# Patient Record
Sex: Female | Born: 1945 | Race: White | Hispanic: No | Marital: Married | Smoking: Never smoker
Health system: Southern US, Community
[De-identification: ages and names within clinical notes are randomized; demographics above are authoritative.]

## PROBLEM LIST (undated history)

## (undated) DIAGNOSIS — I10 Essential (primary) hypertension: Secondary | ICD-10-CM

## (undated) DIAGNOSIS — J449 Chronic obstructive pulmonary disease, unspecified: Secondary | ICD-10-CM

## (undated) DIAGNOSIS — K219 Gastro-esophageal reflux disease without esophagitis: Secondary | ICD-10-CM

## (undated) HISTORY — PX: BREAST SURGERY: SHX581

## (undated) HISTORY — PX: TONSILLECTOMY: SUR1361

## (undated) HISTORY — PX: ABDOMINAL HYSTERECTOMY: SHX81

## (undated) HISTORY — PX: CHOLECYSTECTOMY: SHX55

---

## 2010-10-01 ENCOUNTER — Other Ambulatory Visit (HOSPITAL_COMMUNITY)
Admission: RE | Admit: 2010-10-01 | Discharge: 2010-10-01 | Disposition: A | Discharge status: Home / Self Care | Payer: Medicare Other | Source: Ambulatory Visit | Attending: Obstetrics and Gynecology | Admitting: Obstetrics and Gynecology

## 2010-10-01 DIAGNOSIS — Z124 Encounter for screening for malignant neoplasm of cervix: Secondary | ICD-10-CM | POA: Insufficient documentation

## 2019-10-02 DIAGNOSIS — I4891 Unspecified atrial fibrillation: Secondary | ICD-10-CM | POA: Insufficient documentation

## 2020-04-11 ENCOUNTER — Encounter (HOSPITAL_BASED_OUTPATIENT_CLINIC_OR_DEPARTMENT_OTHER): Payer: Self-pay | Admitting: *Deleted

## 2020-04-11 ENCOUNTER — Emergency Department (HOSPITAL_BASED_OUTPATIENT_CLINIC_OR_DEPARTMENT_OTHER): Payer: Medicare Other

## 2020-04-11 ENCOUNTER — Other Ambulatory Visit: Payer: Self-pay

## 2020-04-11 ENCOUNTER — Inpatient Hospital Stay (HOSPITAL_BASED_OUTPATIENT_CLINIC_OR_DEPARTMENT_OTHER)
Admission: EM | Admit: 2020-04-11 | Discharge: 2020-04-17 | DRG: 853 | Disposition: A | Discharge status: Home Health | Payer: Medicare Other | Attending: Internal Medicine | Admitting: Internal Medicine

## 2020-04-11 DIAGNOSIS — M5481 Occipital neuralgia: Secondary | ICD-10-CM | POA: Diagnosis not present

## 2020-04-11 DIAGNOSIS — N179 Acute kidney failure, unspecified: Secondary | ICD-10-CM | POA: Diagnosis present

## 2020-04-11 DIAGNOSIS — E876 Hypokalemia: Secondary | ICD-10-CM | POA: Diagnosis present

## 2020-04-11 DIAGNOSIS — E860 Dehydration: Secondary | ICD-10-CM | POA: Diagnosis present

## 2020-04-11 DIAGNOSIS — Z20822 Contact with and (suspected) exposure to covid-19: Secondary | ICD-10-CM | POA: Diagnosis present

## 2020-04-11 DIAGNOSIS — R680 Hypothermia, not associated with low environmental temperature: Secondary | ICD-10-CM | POA: Diagnosis present

## 2020-04-11 DIAGNOSIS — I10 Essential (primary) hypertension: Secondary | ICD-10-CM | POA: Diagnosis not present

## 2020-04-11 DIAGNOSIS — R29810 Facial weakness: Secondary | ICD-10-CM | POA: Diagnosis not present

## 2020-04-11 DIAGNOSIS — R4781 Slurred speech: Secondary | ICD-10-CM | POA: Diagnosis not present

## 2020-04-11 DIAGNOSIS — J449 Chronic obstructive pulmonary disease, unspecified: Secondary | ICD-10-CM | POA: Diagnosis present

## 2020-04-11 DIAGNOSIS — N390 Urinary tract infection, site not specified: Secondary | ICD-10-CM | POA: Diagnosis present

## 2020-04-11 DIAGNOSIS — I959 Hypotension, unspecified: Secondary | ICD-10-CM | POA: Diagnosis present

## 2020-04-11 DIAGNOSIS — N183 Chronic kidney disease, stage 3 unspecified: Secondary | ICD-10-CM | POA: Diagnosis present

## 2020-04-11 DIAGNOSIS — J1081 Influenza due to other identified influenza virus with encephalopathy: Secondary | ICD-10-CM | POA: Diagnosis present

## 2020-04-11 DIAGNOSIS — G9340 Encephalopathy, unspecified: Secondary | ICD-10-CM | POA: Diagnosis present

## 2020-04-11 DIAGNOSIS — J111 Influenza due to unidentified influenza virus with other respiratory manifestations: Secondary | ICD-10-CM | POA: Diagnosis not present

## 2020-04-11 DIAGNOSIS — G459 Transient cerebral ischemic attack, unspecified: Secondary | ICD-10-CM | POA: Diagnosis not present

## 2020-04-11 DIAGNOSIS — E872 Acidosis: Secondary | ICD-10-CM | POA: Diagnosis present

## 2020-04-11 DIAGNOSIS — R32 Unspecified urinary incontinence: Secondary | ICD-10-CM | POA: Diagnosis present

## 2020-04-11 DIAGNOSIS — I4891 Unspecified atrial fibrillation: Secondary | ICD-10-CM | POA: Diagnosis present

## 2020-04-11 DIAGNOSIS — J102 Influenza due to other identified influenza virus with gastrointestinal manifestations: Secondary | ICD-10-CM | POA: Diagnosis present

## 2020-04-11 DIAGNOSIS — G319 Degenerative disease of nervous system, unspecified: Secondary | ICD-10-CM | POA: Diagnosis present

## 2020-04-11 DIAGNOSIS — R29701 NIHSS score 1: Secondary | ICD-10-CM | POA: Diagnosis not present

## 2020-04-11 DIAGNOSIS — Z5309 Procedure and treatment not carried out because of other contraindication: Secondary | ICD-10-CM

## 2020-04-11 DIAGNOSIS — I6389 Other cerebral infarction: Secondary | ICD-10-CM | POA: Diagnosis not present

## 2020-04-11 DIAGNOSIS — Z90710 Acquired absence of both cervix and uterus: Secondary | ICD-10-CM | POA: Diagnosis not present

## 2020-04-11 DIAGNOSIS — K219 Gastro-esophageal reflux disease without esophagitis: Secondary | ICD-10-CM | POA: Diagnosis present

## 2020-04-11 DIAGNOSIS — I129 Hypertensive chronic kidney disease with stage 1 through stage 4 chronic kidney disease, or unspecified chronic kidney disease: Secondary | ICD-10-CM | POA: Diagnosis present

## 2020-04-11 DIAGNOSIS — I6521 Occlusion and stenosis of right carotid artery: Secondary | ICD-10-CM

## 2020-04-11 DIAGNOSIS — I479 Paroxysmal tachycardia, unspecified: Secondary | ICD-10-CM | POA: Diagnosis not present

## 2020-04-11 DIAGNOSIS — I639 Cerebral infarction, unspecified: Secondary | ICD-10-CM | POA: Diagnosis not present

## 2020-04-11 HISTORY — DX: Essential (primary) hypertension: I10

## 2020-04-11 HISTORY — DX: Gastro-esophageal reflux disease without esophagitis: K21.9

## 2020-04-11 HISTORY — DX: Chronic obstructive pulmonary disease, unspecified: J44.9

## 2020-04-11 LAB — URINALYSIS, ROUTINE W REFLEX MICROSCOPIC
Bilirubin Urine: NEGATIVE
Glucose, UA: NEGATIVE mg/dL
Ketones, ur: NEGATIVE mg/dL
Nitrite: NEGATIVE
Protein, ur: 30 mg/dL — AB
Specific Gravity, Urine: >1.03 — ABNORMAL HIGH (ref 1.005–1.030)
pH: 5 (ref 5.0–8.0)

## 2020-04-11 LAB — COMPREHENSIVE METABOLIC PANEL
ALT: 11 U/L (ref 0–44)
AST: 11 U/L — ABNORMAL LOW (ref 15–41)
Albumin: 4.3 g/dL (ref 3.5–5.0)
Alkaline Phosphatase: 98 U/L (ref 38–126)
Anion gap: 19 — ABNORMAL HIGH (ref 5–15)
BUN: 105 mg/dL — ABNORMAL HIGH (ref 8–23)
CO2: 10 mmol/L — ABNORMAL LOW (ref 22–32)
Calcium: 8.7 mg/dL — ABNORMAL LOW (ref 8.9–10.3)
Chloride: 105 mmol/L (ref 98–111)
Creatinine, Ser: 10.72 mg/dL — ABNORMAL HIGH (ref 0.44–1.00)
GFR, Estimated: 3 mL/min — ABNORMAL LOW (ref >60)
Glucose, Bld: 124 mg/dL — ABNORMAL HIGH (ref 70–99)
Potassium: 4.4 mmol/L (ref 3.5–5.1)
Sodium: 134 mmol/L — ABNORMAL LOW (ref 135–145)
Total Bilirubin: 0.7 mg/dL (ref 0.3–1.2)
Total Protein: 7.9 g/dL (ref 6.5–8.1)

## 2020-04-11 LAB — PROTIME-INR
INR: 1.1 (ref 0.8–1.2)
Prothrombin Time: 13.8 seconds (ref 11.4–15.2)

## 2020-04-11 LAB — CBC
HCT: 45.3 % (ref 36.0–46.0)
Hemoglobin: 15.9 g/dL — ABNORMAL HIGH (ref 12.0–15.0)
MCH: 31.1 pg (ref 26.0–34.0)
MCHC: 35.1 g/dL (ref 30.0–36.0)
MCV: 88.5 fL (ref 80.0–100.0)
Platelets: 231 10*3/uL (ref 150–400)
RBC: 5.12 MIL/uL — ABNORMAL HIGH (ref 3.87–5.11)
RDW: 13.5 % (ref 11.5–15.5)
WBC: 9.4 10*3/uL (ref 4.0–10.5)
nRBC: 0 % (ref 0.0–0.2)

## 2020-04-11 LAB — TROPONIN I (HIGH SENSITIVITY)
Troponin I (High Sensitivity): 4 ng/L (ref <18)
Troponin I (High Sensitivity): 4 ng/L (ref <18)

## 2020-04-11 LAB — RESP PANEL BY RT-PCR (FLU A&B, COVID) ARPGX2
Influenza A by PCR: POSITIVE — AB
Influenza B by PCR: NEGATIVE
SARS Coronavirus 2 by RT PCR: NEGATIVE

## 2020-04-11 LAB — URINALYSIS, MICROSCOPIC (REFLEX)

## 2020-04-11 LAB — DIFFERENTIAL
Abs Immature Granulocytes: 0.03 10*3/uL (ref 0.00–0.07)
Basophils Absolute: 0 10*3/uL (ref 0.0–0.1)
Basophils Relative: 0 %
Eosinophils Absolute: 0 10*3/uL (ref 0.0–0.5)
Eosinophils Relative: 0 %
Immature Granulocytes: 0 %
Lymphocytes Relative: 34 %
Lymphs Abs: 3.2 10*3/uL (ref 0.7–4.0)
Monocytes Absolute: 0.6 10*3/uL (ref 0.1–1.0)
Monocytes Relative: 6 %
Neutro Abs: 5.5 10*3/uL (ref 1.7–7.7)
Neutrophils Relative %: 60 %

## 2020-04-11 LAB — CBG MONITORING, ED: Glucose-Capillary: 110 mg/dL — ABNORMAL HIGH (ref 70–99)

## 2020-04-11 LAB — LACTIC ACID, PLASMA: Lactic Acid, Venous: 1 mmol/L (ref 0.5–1.9)

## 2020-04-11 LAB — APTT: aPTT: 27 seconds (ref 24–36)

## 2020-04-11 IMAGING — DX DG CHEST 1V PORT
1 series · 1 of 1 positions shown · non-contrast
Comparison: None.

CLINICAL DATA: Questionable sepsis

EXAM:
PORTABLE CHEST 1 VIEW

[chest ap]
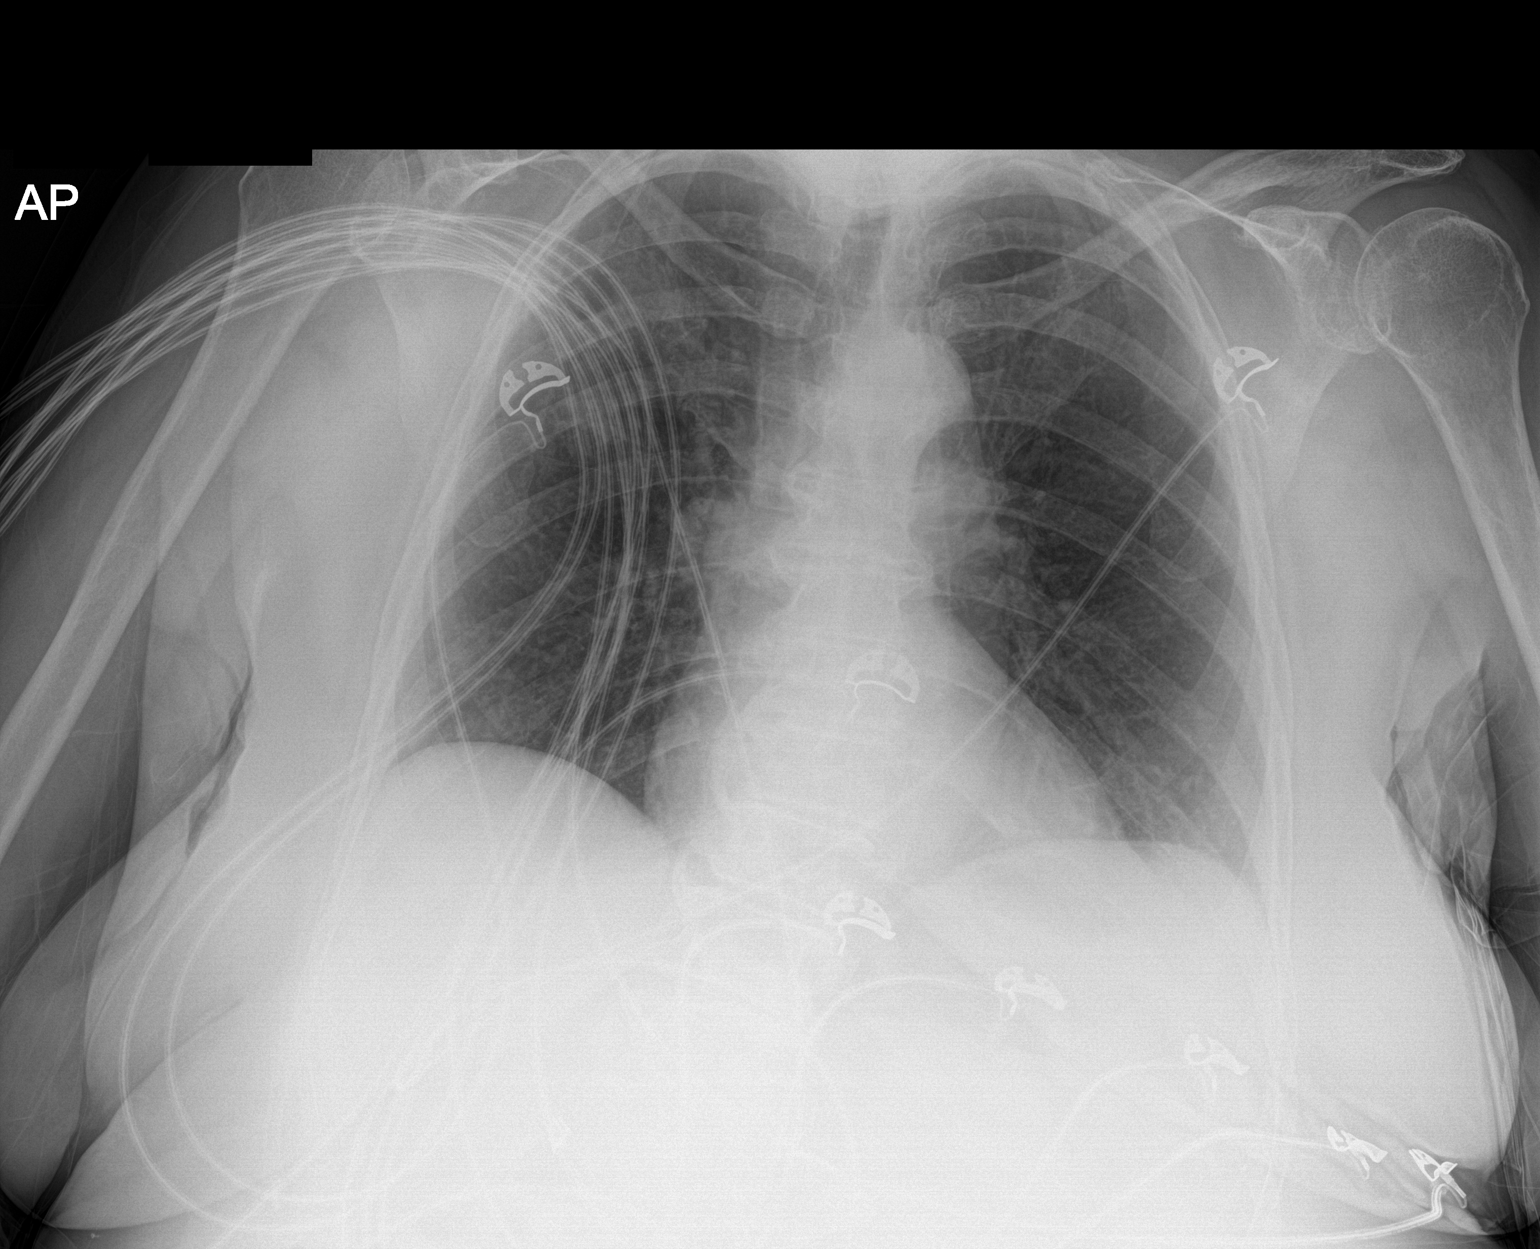

[1 of 1 positions shown; findings below may reference images not displayed]

FINDINGS: The lungs are clear and negative for focal airspace consolidation,
pulmonary edema or suspicious pulmonary nodule. No pleural effusion
or pneumothorax. Cardiac and mediastinal contours are within normal
limits. No acute fracture or lytic or blastic osseous lesions. The
visualized upper abdominal bowel gas pattern is unremarkable.
IMPRESSION: No active disease.

## 2020-04-11 IMAGING — CT CT HEAD W/O CM
3 series · 16 of 47 positions shown, 19 images · non-contrast
Comparison: Head CT, [DATE]

CLINICAL DATA: Confusion for a week. Diarrhea last weekend.
Weakness.

EXAM:
CT HEAD WITHOUT CONTRAST
TECHNIQUE: Contiguous axial images were obtained from the base of the skull
through the vertex without intravenous contrast.

[Series 2: head wo · axial · 0.41mm/px · z∈[-186,-61]mm · 10 of 31 slices shown, 13 images]
[im 3/31  brain]
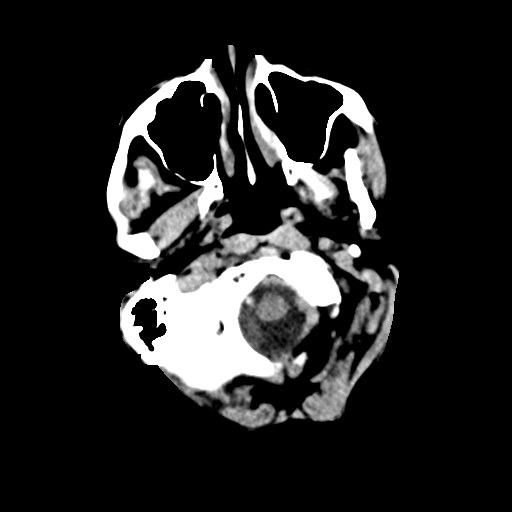
[im 3/31  bone]
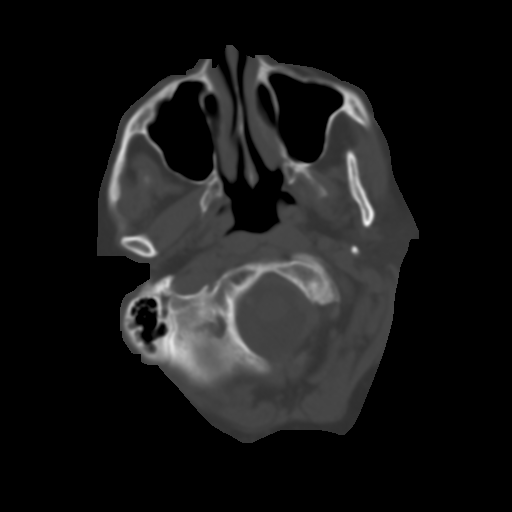
[im 6/31  brain]
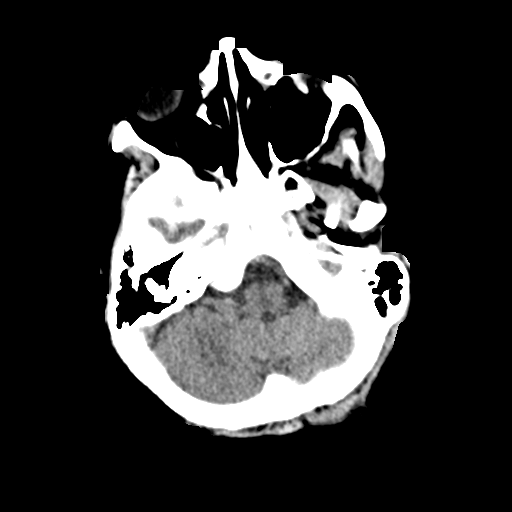
[im 9/31  brain]
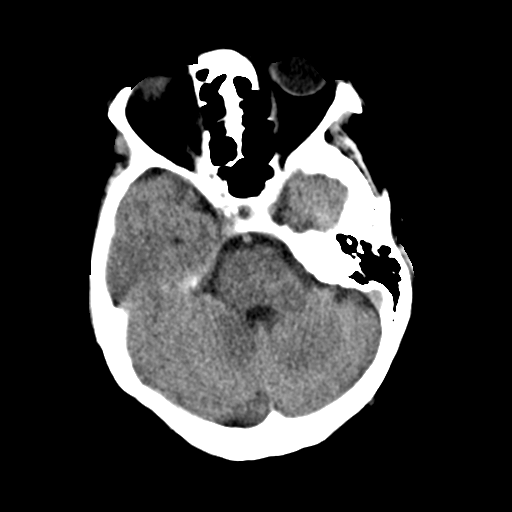
[im 11/31  brain]
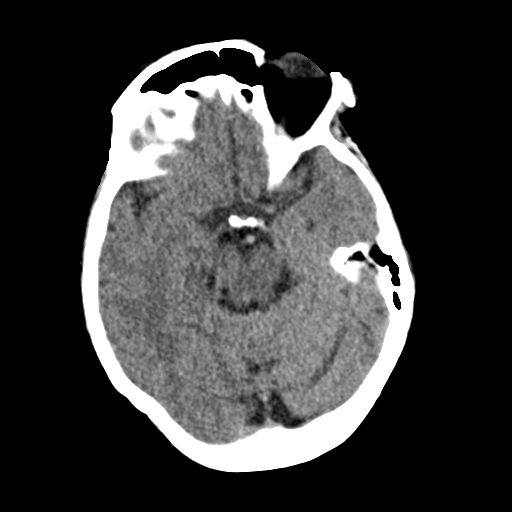
[im 14/31  brain]
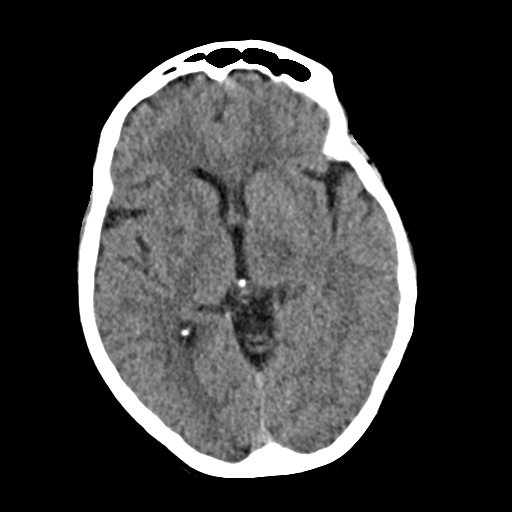
[im 14/31  bone]
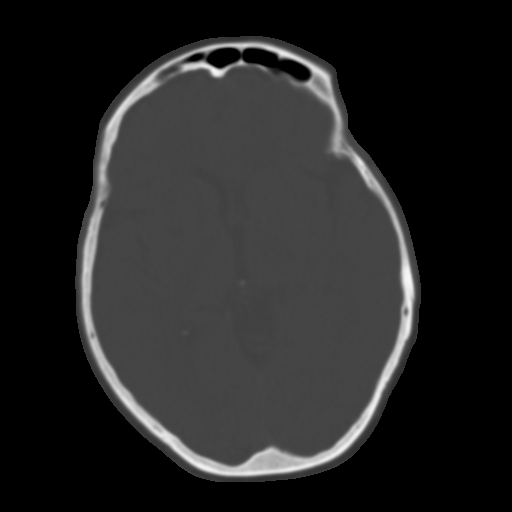
[im 17/31  brain]
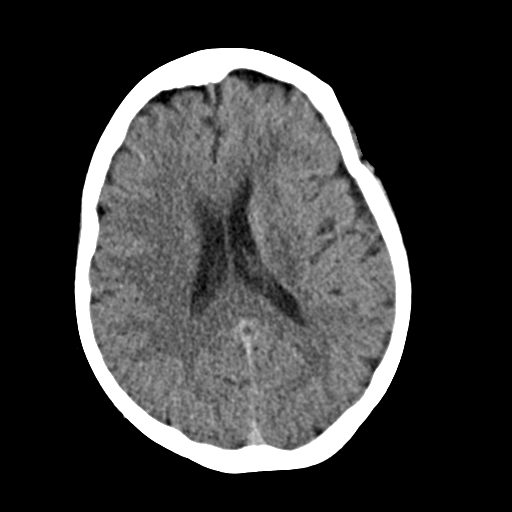
[im 20/31  brain]
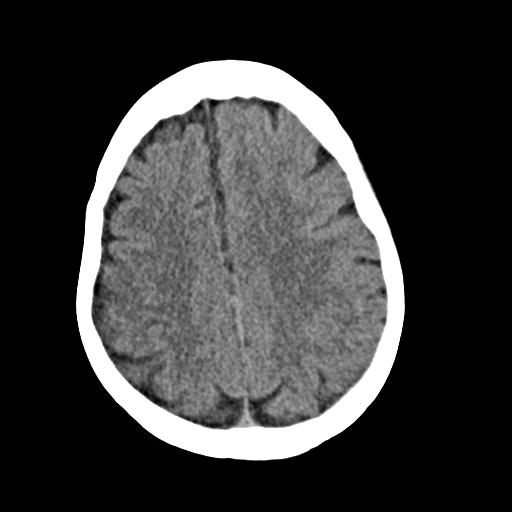
[im 23/31  brain]
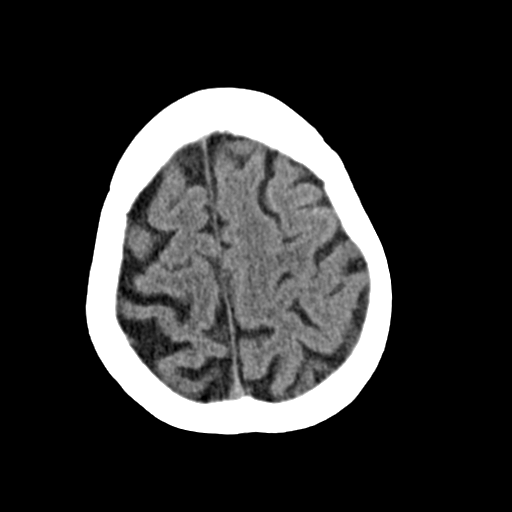
[im 25/31  brain]
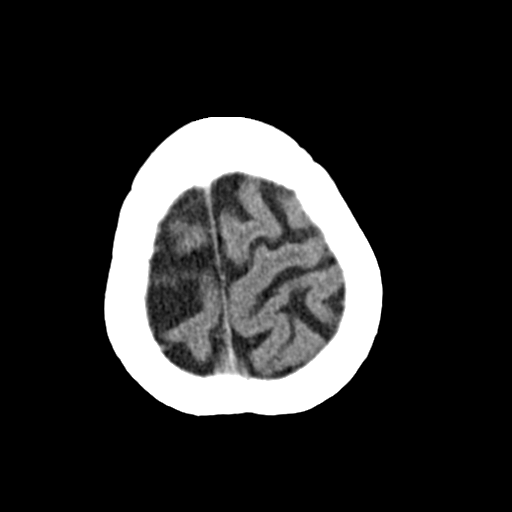
[im 25/31  bone]
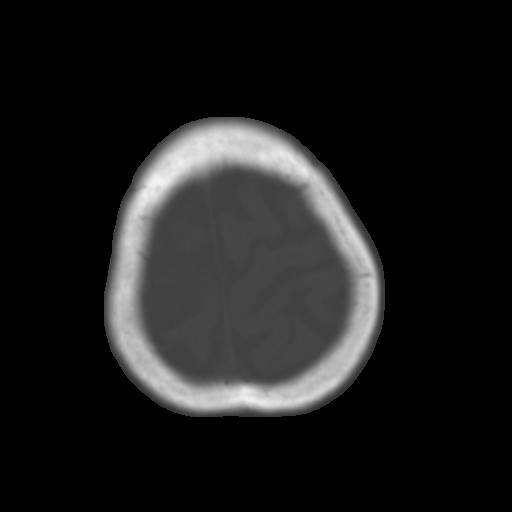
[im 28/31  brain]
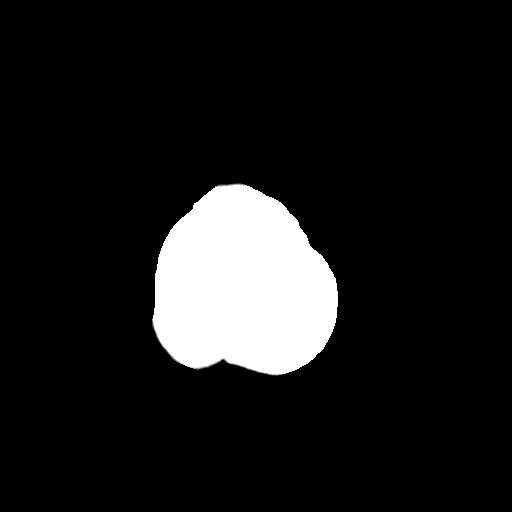

[Series 4: coronal soft · coronal · 0.33mm/px · 3 of 66 slices shown]
[im 22/66  brain]
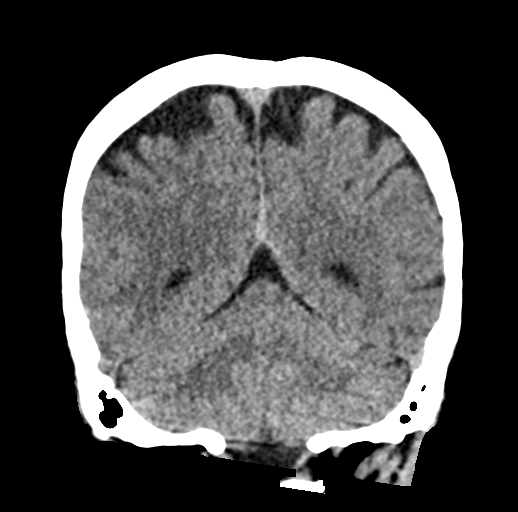
[im 29/66  brain]
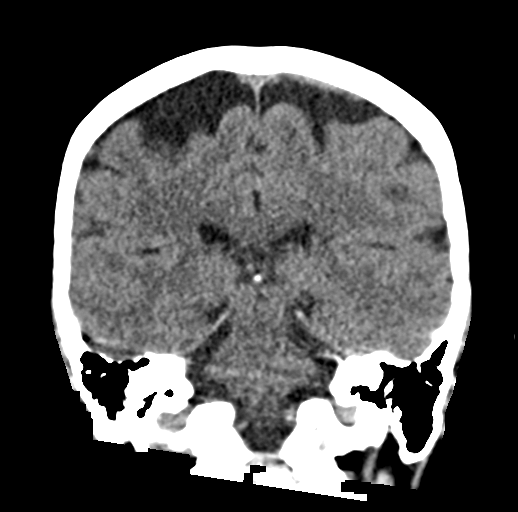
[im 37/66  brain]
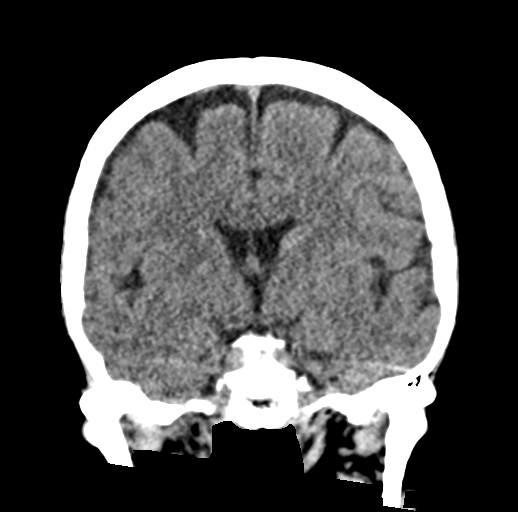

[Series 5: sag soft · sagittal · 0.33mm/px · 3 of 51 slices shown]
[im 19/51  brain]
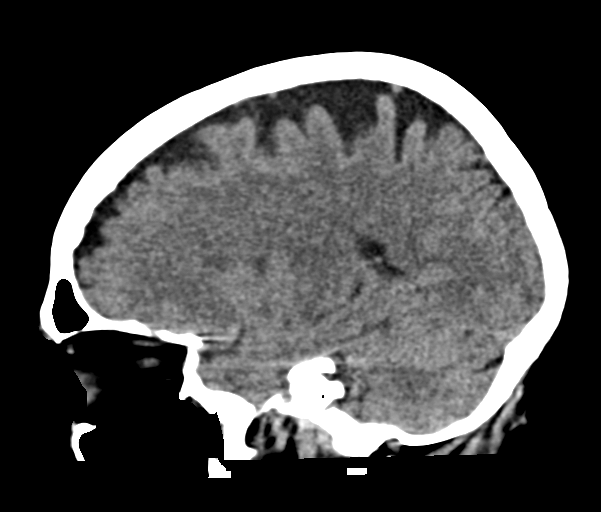
[im 26/51  brain]
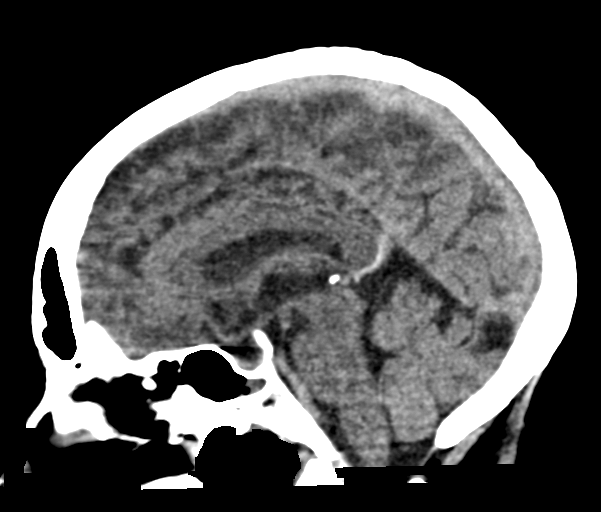
[im 33/51  brain]
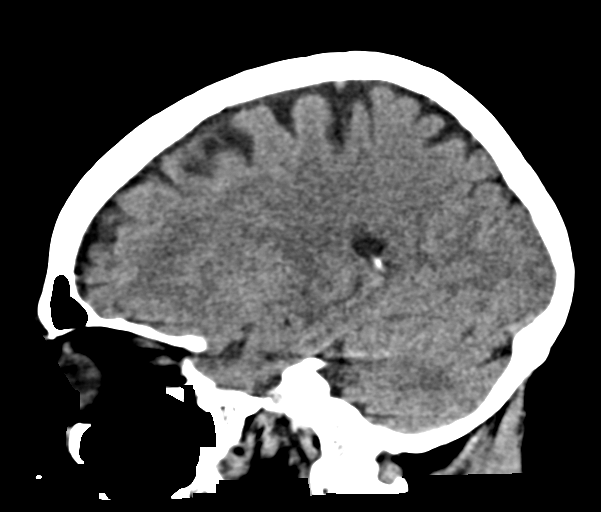

[16 of 47 positions shown; findings below may reference images not displayed]

FINDINGS: Brain: No evidence of acute infarction, hemorrhage, hydrocephalus,
extra-axial collection or mass lesion/mass effect.

Small area of lucency in the right basal ganglia consistent with a
old lacunar infarct. Minor areas of white matter hypoattenuation are
noted bilaterally consistent with chronic microvascular ischemic
change.

Vascular: No hyperdense vessel or unexpected calcification.

Skull: Normal. Negative for fracture or focal lesion.

Sinuses/Orbits: Globes and orbits are unremarkable. Visualized
sinuses are clear.

Other: None.
IMPRESSION: 1. No acute intracranial abnormalities.

## 2020-04-11 MED ORDER — SODIUM CHLORIDE 0.9 % IV SOLN: INTRAVENOUS | Status: DC | PRN

## 2020-04-11 MED ORDER — PIPERACILLIN-TAZOBACTAM 3.375 G IVPB 30 MIN
3.3750 g | Freq: Once | INTRAVENOUS | Status: AC
  Administered 2020-04-11: 3.375 g via INTRAVENOUS
  Filled 2020-04-11: qty 50

## 2020-04-11 MED ORDER — LACTATED RINGERS IV BOLUS
1000.0000 mL | Freq: Once | INTRAVENOUS | Status: AC
  Administered 2020-04-11: 1000 mL via INTRAVENOUS

## 2020-04-11 MED ORDER — SODIUM CHLORIDE 0.9% FLUSH
3.0000 mL | Freq: Once | INTRAVENOUS | Status: DC
  Filled 2020-04-11: qty 3

## 2020-04-11 MED ORDER — SODIUM CHLORIDE 0.9 % IV BOLUS
1000.0000 mL | Freq: Once | INTRAVENOUS | Status: AC
  Administered 2020-04-11: 1000 mL via INTRAVENOUS

## 2020-04-11 MED ORDER — OSELTAMIVIR PHOSPHATE 75 MG PO CAPS
75.0000 mg | ORAL_CAPSULE | Freq: Once | ORAL | Status: AC
  Administered 2020-04-11: 75 mg via ORAL
  Filled 2020-04-11: qty 1

## 2020-04-11 NOTE — ED Notes (Signed)
Warm blankets applied to Pt directly after temp foley inserted. Pt temp started at 31 and is currently 35.9

## 2020-04-11 NOTE — ED Notes (Signed)
Hong MD advises to use saline bolus only, LR not compatible with Zosyn, no need for LR if patient BP stays above 100.

## 2020-04-11 NOTE — ED Triage Notes (Signed)
Weakness. Confused for a week. Diarrhea x Friday, Saturday and Sunday.

## 2020-04-11 NOTE — ED Notes (Signed)
Pt husband at bedside, given the information of the pt room and phone number for floor

## 2020-04-11 NOTE — H&P (Signed)
History and Physical   Roslin Norwood XYB:[id number] DOB: Oct 10, 1945 DOA: 04/11/2020  Referring MD/NP/PA: Dr. Audley Hose, at [facility]  PCP: Pcp, No   Outpatient Specialists: None  Patient coming from: [facility]  Chief Complaint: Weakness and fatigue  HPI: Vicki Swanson is a 74 y.o. female with medical history significant of hypertension, COPD, GERD who presented to med [facility] with 5 days of pulse.  Diarrhea fever weakness and fatigue.  Patient lives with her husband and has been doing fine until this onset of symptoms.  She has not been vaccinated against COVID-19.  She came to the ER very symptomatic and weak.  She was also altered in her mental status.  Husband has tried to let patient eat and drink at home on that has become difficult.  In the last 3 days she has slowly deteriorated so he brought her to the ER for further evaluation and treatment.  Patient is seen and evaluated.  She is appears to be in acute kidney injury.  Has not been vaccinated against COVID-19 but viral screen shows patient is positive for influenza.  She is being admitted to the hospital with acute influenza infection as well as acute kidney injury.  No previous records in the hospital or in our system but creatinine is above 10.  With spew is diarrhea over the last 5 days patient presumed to have prerenal azotemia.  No obvious acute need for hemodialysis..  ED Course: Temperature 95 with blood pressure 86/54 initially pulse 54 respiratory rate 21 oxygen sat 95% room air.  Current temperature up to 98.3 and blood pressure 118/53.  Acute viral screen is positive for influenza A.  Urinalysis showed cloudy urine with moderate leukocytes nitrite negative.  WBC 21-50 and many bacteria.  Sodium 134 potassium 4.4 chloride 105 CO2 20 glucose 124 BUN 105 creatinine 10.72 calcium 8.7.  Anion gap of 19.  Troponin of 4 lactic acid 1.0.  CBC entirely within normal.  Head CT without contrast is negative.  Chest  x-ray shows no active finding.  Patient being admitted with acute influenza A infection.  Review of Systems: As per HPI otherwise 10 point review of systems negative.    Past Medical History:  Diagnosis Date  . COPD (chronic obstructive pulmonary disease) (HCC)   . GERD (gastroesophageal reflux disease)   . Hypertension     Past Surgical History:  Procedure Laterality Date  . ABDOMINAL HYSTERECTOMY    . BREAST SURGERY    . CHOLECYSTECTOMY    . TONSILLECTOMY       reports that she has never smoked. She has never used smokeless tobacco. She reports that she does not drink alcohol and does not use drugs.  No Known Allergies  No family history on file.   Prior to Admission medications   Not on File    Physical Exam: Vitals:   04/11/20 2000 04/11/20 2030 04/11/20 2100 04/11/20 2224  BP: (!) 109/52 (!) 115/51 (!) 121/54 (!) 118/53  Pulse: 64 67 66 64  Resp: 15 20 17 16   Temp: 98.4 F (36.9 C) 98.6 F (37 C) 98.6 F (37 C) 98.3 F (36.8 C)  TempSrc:    Oral  SpO2: 100% 98% 100% 98%  Weight:      Height:          Constitutional: Acutely ill looking, confused, Vitals:   04/11/20 2000 04/11/20 2030 04/11/20 2100 04/11/20 2224  BP: (!) 109/52 (!) 115/51 (!) 121/54 (!) 118/53  Pulse: 64 67 66 64  Resp: 15 20 17 16   Temp: 98.4 F (36.9 C) 98.6 F (37 C) 98.6 F (37 C) 98.3 F (36.8 C)  TempSrc:    Oral  SpO2: 100% 98% 100% 98%  Weight:      Height:       Eyes: PERRL, lids and conjunctivae normal ENMT: Mucous membranes are dry. Posterior pharynx clear of any exudate or lesions.Normal dentition.  Neck: normal, supple, no masses, no thyromegaly Respiratory: Coarse breath sounds with mild rhonchi bilaterally, no wheezing, no crackles. Normal respiratory effort. No accessory muscle use.  Cardiovascular: Regular rate and rhythm, no murmurs / rubs / gallops. No extremity edema. 2+ pedal pulses. No carotid bruits.  Abdomen: no tenderness, no masses palpated. No  hepatosplenomegaly. Bowel sounds positive.  Musculoskeletal: no clubbing / cyanosis. No joint deformity upper and lower extremities. Good ROM, no contractures. Normal muscle tone.  Skin: no rashes, lesions, ulcers. No induration Neurologic: CN 2-12 grossly intact. Sensation intact, DTR normal. Strength 5/5 in all 4.  Psychiatric: Confused, not agitated.     Labs on Admission: I have personally reviewed following labs and imaging studies  CBC: Recent Labs  Lab 04/11/20 1250  WBC 9.4  NEUTROABS 5.5  HGB 15.9*  HCT 45.3  MCV 88.5  PLT 231   Basic Metabolic Panel: Recent Labs  Lab 04/11/20 1250  NA 134*  K 4.4  CL 105  CO2 10*  GLUCOSE 124*  BUN 105*  CREATININE 10.72*  CALCIUM 8.7*   GFR: Estimated Creatinine Clearance: 3.8 mL/min (A) (by C-G formula based on SCr of 10.72 mg/dL (H)). Liver Function Tests: Recent Labs  Lab 04/11/20 1250  AST 11*  ALT 11  ALKPHOS 98  BILITOT 0.7  PROT 7.9  ALBUMIN 4.3   No results for input(s): LIPASE, AMYLASE in the last 168 hours. No results for input(s): AMMONIA in the last 168 hours. Coagulation Profile: Recent Labs  Lab 04/11/20 1250  INR 1.1   Cardiac Enzymes: No results for input(s): CKTOTAL, CKMB, CKMBINDEX, TROPONINI in the last 168 hours. BNP (last 3 results) No results for input(s): PROBNP in the last 8760 hours. HbA1C: No results for input(s): HGBA1C in the last 72 hours. CBG: Recent Labs  Lab 04/11/20 1249  GLUCAP 110*   Lipid Profile: No results for input(s): CHOL, HDL, LDLCALC, TRIG, CHOLHDL, LDLDIRECT in the last 72 hours. Thyroid Function Tests: No results for input(s): TSH, T4TOTAL, FREET4, T3FREE, THYROIDAB in the last 72 hours. Anemia Panel: No results for input(s): VITAMINB12, FOLATE, FERRITIN, TIBC, IRON, RETICCTPCT in the last 72 hours. Urine analysis:    Component Value Date/Time   COLORURINE YELLOW 04/11/2020 1700   APPEARANCEUR CLOUDY (A) 04/11/2020 1700   LABSPEC >1.030 (H) 04/11/2020  1700   PHURINE 5.0 04/11/2020 1700   GLUCOSEU NEGATIVE 04/11/2020 1700   HGBUR SMALL (A) 04/11/2020 1700   BILIRUBINUR NEGATIVE 04/11/2020 1700   KETONESUR NEGATIVE 04/11/2020 1700   PROTEINUR 30 (A) 04/11/2020 1700   NITRITE NEGATIVE 04/11/2020 1700   LEUKOCYTESUR MODERATE (A) 04/11/2020 1700   Sepsis Labs: @LABRCNTIP (procalcitonin:4,lacticidven:4) ) Recent Results (from the past 240 hour(s))  Resp Panel by RT-PCR (Flu A&B, Covid) Nasopharyngeal Swab     Status: Abnormal   Collection Time: 04/11/20  1:12 PM   Specimen: Nasopharyngeal Swab; Nasopharyngeal(NP) swabs in vial transport medium  Result Value Ref Range Status   SARS Coronavirus 2 by RT PCR NEGATIVE NEGATIVE Final    Comment: (NOTE) SARS-CoV-2 target nucleic acids are  NOT DETECTED.  The SARS-CoV-2 RNA is generally detectable in upper respiratory specimens during the acute phase of infection. The lowest concentration of SARS-CoV-2 viral copies this assay can detect is 138 copies/mL. A negative result does not preclude SARS-Cov-2 infection and should not be used as the sole basis for treatment or other patient management decisions. A negative result may occur with  improper specimen collection/handling, submission of specimen other than nasopharyngeal swab, presence of viral mutation(s) within the areas targeted by this assay, and inadequate number of viral copies(<138 copies/mL). A negative result must be combined with clinical observations, patient history, and epidemiological information. The expected result is Negative.  Fact Sheet for Patients:  BloggerCourse.comhttps://www.fda.gov/media/152166/download  Fact Sheet for Healthcare Providers:  SeriousBroker.ithttps://www.fda.gov/media/152162/download  This test is no t yet approved or cleared by the [place] FDA and  has been authorized for detection and/or diagnosis of SARS-CoV-2 by FDA under an Emergency Use Authorization (EUA). This EUA will remain  in effect (meaning this test can be  used) for the duration of the COVID-19 declaration under Section 564(b)(1) of the Act, 21 U.S.C.section 360bbb-3(b)(1), unless the authorization is terminated  or revoked sooner.       Influenza A by PCR POSITIVE (A) NEGATIVE Final   Influenza B by PCR NEGATIVE NEGATIVE Final    Comment: (NOTE) The Xpert Xpress SARS-CoV-2/FLU/RSV plus assay is intended as an aid in the diagnosis of influenza from Nasopharyngeal swab specimens and should not be used as a sole basis for treatment. Nasal washings and aspirates are unacceptable for Xpert Xpress SARS-CoV-2/FLU/RSV testing.  Fact Sheet for Patients: BloggerCourse.comhttps://www.fda.gov/media/152166/download  Fact Sheet for Healthcare Providers: SeriousBroker.ithttps://www.fda.gov/media/152162/download  This test is not yet approved or cleared by the [place] FDA and has been authorized for detection and/or diagnosis of SARS-CoV-2 by FDA under an Emergency Use Authorization (EUA). This EUA will remain in effect (meaning this test can be used) for the duration of the COVID-19 declaration under Section 564(b)(1) of the Act, 21 U.S.C. section 360bbb-3(b)(1), unless the authorization is terminated or revoked.  Performed at [facility], [address] [place], [place] [id number]      Radiological Exams on Admission: CT HEAD WO CONTRAST  Result Date: 04/11/2020 CLINICAL DATA:  Confusion for a week. Diarrhea last weekend. Weakness. EXAM: CT HEAD WITHOUT CONTRAST TECHNIQUE: Contiguous axial images were obtained from the base of the skull through the vertex without intravenous contrast. COMPARISON:  Head CT, 06/07/2016 FINDINGS: Brain: No evidence of acute infarction, hemorrhage, hydrocephalus, extra-axial collection or mass lesion/mass effect. Small area of lucency in the right basal ganglia consistent with a old lacunar infarct. Minor areas of white matter hypoattenuation are noted bilaterally consistent with chronic microvascular ischemic change. Vascular:  No hyperdense vessel or unexpected calcification. Skull: Normal. Negative for fracture or focal lesion. Sinuses/Orbits: Globes and orbits are unremarkable. Visualized sinuses are clear. Other: None. IMPRESSION: 1. No acute intracranial abnormalities. Electronically Signed   By: Amie Portlandavid  Ormond M.D.   On: 04/11/2020 13:49   DG Chest Port 1 View  Result Date: 04/11/2020 CLINICAL DATA:  Questionable sepsis EXAM: PORTABLE CHEST 1 VIEW COMPARISON:  None. FINDINGS: The lungs are clear and negative for focal airspace consolidation, pulmonary edema or suspicious pulmonary nodule. No pleural effusion or pneumothorax. Cardiac and mediastinal contours are within normal limits. No acute fracture or lytic or blastic osseous lesions. The visualized upper abdominal bowel gas pattern is unremarkable. IMPRESSION: No active disease. Electronically Signed   By: Isac CaddyHeath  McCullough M.D.  On: 04/11/2020 13:48     Assessment/Plan Principal Problem:   Influenza Active Problems:   Acute lower UTI   COPD (chronic obstructive pulmonary disease) (HCC)   GERD (gastroesophageal reflux disease)   Essential hypertension   AKI (acute kidney injury) (HCC)     #1 acute influenza A infection: Patient will be admitted for symptomatic management.  Hydrate patient.  Initiate Tamiflu for 5 days.  Mainly hydration and pain management.  #2 acute kidney injury: No documented history of chronic kidney disease.  No prior records in our system.  At this point patient is presumed to have AKI because of 5 days experience diarrhea and possible dehydration.  I will still check a renal ultrasound to rule out obstructive uropathy.  If none exist we will hydrate and consider nephrology consult if no immediate improvement  #3 COPD: No exacerbation  #4 GERD: Continue his PPIs  #5 essential hypertension: Patient was profoundly hypotensive in the ER.  Blood pressure now normal.  Hold antihypertensives for now  #6 possible UTI: Based on  patient's urinalysis she may have had a UTI.  Continue empiric Rocephin.  Await urine culture and sensitivity.  #7 confusion and altered mental status: Secondary to acute illness including influenza as well as the AKI.  Continue monitoring  #8 history of atrial fibrillation: No obvious A. fib now.  Patient does not appear to be on chronic medications.  Will obtain records in the morning   DVT prophylaxis: Heparin Code Status: Full code Family Communication: Husband Disposition Plan: Home Consults called: None Admission status: Inpatient  Severity of Illness: The appropriate patient status for this patient is INPATIENT. Inpatient status is judged to be reasonable and necessary in order to provide the required intensity of service to ensure the patient's safety. The patient's presenting symptoms, physical exam findings, and initial radiographic and laboratory data in the context of their chronic comorbidities is felt to place them at high risk for further clinical deterioration. Furthermore, it is not anticipated that the patient will be medically stable for discharge from the hospital within 2 midnights of admission. The following factors support the patient status of inpatient.   " The patient's presenting symptoms include confusion and generalized weakness. " The worrisome physical exam findings include fatigue and dry mucous membranes. " The initial radiographic and laboratory data are worrisome because of creatinine of more than 10 and positive influenza A. " The chronic co-morbidities include atrial fibrillation.   * I certify that at the point of admission it is my clinical judgment that the patient will require inpatient hospital care spanning beyond 2 midnights from the point of admission due to high intensity of service, high risk for further deterioration and high frequency of surveillance required.Lonia Blood MD [facility] Pager [phone]  If 7PM-7AM, please  contact night-coverage [email or web address] Password [facility]  04/11/2020, 11:54 PM

## 2020-04-11 NOTE — H&P (Incomplete)
History and Physical   Vicki Swanson ZOX:[id number] DOB: [birth year] DOA: 04/11/2020  Referring MD/NP/PA: Dr. Audley HoseHong, at [facility]  PCP: Pcp, No   Outpatient Specialists: None  [facility] from[facility]  Chief Complaint: Weakness and fatigue  HPI: Vicki Swanson is a 74 y.o. female with medical history significant of hypertension, COPD, GERD who [facility] 5 days of pulse.  Diarrhea fever weakness and fatigue.  Patient lives with her husband and has been doing fine until this onset of symptoms.  She has not been vaccinated against COVID-19.  She came to the ER very symptomatic and weak.  She was also altered in her mental status.  Husband has tried to let patient eat and drink at home on that has become difficult.  In the last 3 days she has slowly deteriorated so he brought her to the ER for further evaluation and treatment.  Patient is seen and evaluated.  She is appears to be in acute kidney injury.  Has not been vaccinated against COVID-19 but viral screen shows patient is positive for influenza.  She is being admitted to the hospital with acute influenza infection as well as acute kidney injury.  No previous records in the hospital or in our system but creatinine is above 10.  With spew is diarrhea over the last 5 days patient presumed to have prerenal azotemia.  No obvious acute need for hemodialysis..  ED Course: Temperature 95 with blood pressure 86/54 initially pulse 54 respiratory rate 21 oxygen sat 95% room air.  Current temperature up to 98.3 and blood pressure 118/53.  Acute viral screen is positive for influenza A.  Urinalysis showed cloudy urine with moderate leukocytes nitrite negative.  WBC 21-50 and many bacteria.  Sodium 134 potassium 4.4 chloride 105 CO2 20 glucose 124 BUN 105 creatinine 10.72 calcium 8.7.  Anion gap of 19.  Troponin of 4 lactic acid 1.0.  CBC entirely within normal.  Head CT without contrast is negative.  Chest  x-ray shows no active finding.  Patient being admitted with acute influenza A infection.  Review of Systems: As per HPI otherwise 10 point review of systems negative.    Past Medical History:  Diagnosis Date  . COPD (chronic obstructive pulmonary disease) (HCC)   . GERD (gastroesophageal reflux disease)   . Hypertension     Past Surgical History:  Procedure Laterality Date  . ABDOMINAL HYSTERECTOMY    . BREAST SURGERY    . CHOLECYSTECTOMY    . TONSILLECTOMY       reports that she has never smoked. She has never used smokeless tobacco. She reports that she does not drink alcohol and does not use drugs.  No Known Allergies  No family history on file.   Prior to Admission medications   Not on File    Physical Exam: Vitals:   04/11/20 2000 04/11/20 2030 04/11/20 2100 04/11/20 2224  BP: (!) 109/52 (!) 115/51 (!) 121/54 (!) 118/53  Pulse: 64 67 66 64  Resp: 15 20 17 16   Temp: 98.4 F (36.9 C) 98.6 F (37 C) 98.6 F (37 C) 98.3 F (36.8 C)  TempSrc:    Oral  SpO2: 100% 98% 100% 98%  Weight:      Height:          Constitutional: NAD, calm, comfortable Vitals:   04/11/20 2000 04/11/20 2030 04/11/20 2100 04/11/20 2224  BP: (!) 109/52 (!) 115/51 (!) 121/54 (!) 118/53  Pulse: 64 67 66 64  Resp: 15 20 17 16   Temp: 98.4 F (36.9 C) 98.6 F (37 C) 98.6 F (37 C) 98.3 F (36.8 C)  TempSrc:    Oral  SpO2: 100% 98% 100% 98%  Weight:      Height:       Eyes: PERRL, lids and conjunctivae normal ENMT: Mucous membranes are moist. Posterior pharynx clear of any exudate or lesions.Normal dentition.  Neck: normal, supple, no masses, no thyromegaly Respiratory: clear to auscultation bilaterally, no wheezing, no crackles. Normal respiratory effort. No accessory muscle use.  Cardiovascular: Regular rate and rhythm, no murmurs / rubs / gallops. No extremity edema. 2+ pedal pulses. No carotid bruits.  Abdomen: no tenderness, no masses palpated. No hepatosplenomegaly. Bowel  sounds positive.  Musculoskeletal: no clubbing / cyanosis. No joint deformity upper and lower extremities. Good ROM, no contractures. Normal muscle tone.  Skin: no rashes, lesions, ulcers. No induration Neurologic: CN 2-12 grossly intact. Sensation intact, DTR normal. Strength 5/5 in all 4.  Psychiatric: Normal judgment and insight. Alert and oriented x 3. Normal mood.     Labs on Admission: I have personally reviewed following labs and imaging studies  CBC: Recent Labs  Lab 04/11/20 1250  WBC 9.4  NEUTROABS 5.5  HGB 15.9*  HCT 45.3  MCV 88.5  PLT 231   Basic Metabolic Panel: Recent Labs  Lab 04/11/20 1250  NA 134*  K 4.4  CL 105  CO2 10*  GLUCOSE 124*  BUN 105*  CREATININE 10.72*  CALCIUM 8.7*   GFR: Estimated Creatinine Clearance: 3.8 mL/min (A) (by C-G formula based on SCr of 10.72 mg/dL (H)). Liver Function Tests: Recent Labs  Lab 04/11/20 1250  AST 11*  ALT 11  ALKPHOS 98  BILITOT 0.7  PROT 7.9  ALBUMIN 4.3   No results for input(s): LIPASE, AMYLASE in the last 168 hours. No results for input(s): AMMONIA in the last 168 hours. Coagulation Profile: Recent Labs  Lab 04/11/20 1250  INR 1.1   Cardiac Enzymes: No results for input(s): CKTOTAL, CKMB, CKMBINDEX, TROPONINI in the last 168 hours. BNP (last 3 results) No results for input(s): PROBNP in the last 8760 hours. HbA1C: No results for input(s): HGBA1C in the last 72 hours. CBG: Recent Labs  Lab 04/11/20 1249  GLUCAP 110*   Lipid Profile: No results for input(s): CHOL, HDL, LDLCALC, TRIG, CHOLHDL, LDLDIRECT in the last 72 hours. Thyroid Function Tests: No results for input(s): TSH, T4TOTAL, FREET4, T3FREE, THYROIDAB in the last 72 hours. Anemia Panel: No results for input(s): VITAMINB12, FOLATE, FERRITIN, TIBC, IRON, RETICCTPCT in the last 72 hours. Urine analysis:    Component Value Date/Time   COLORURINE YELLOW 04/11/2020 1700   APPEARANCEUR CLOUDY (A) 04/11/2020 1700   LABSPEC  >1.030 (H) 04/11/2020 1700   PHURINE 5.0 04/11/2020 1700   GLUCOSEU NEGATIVE 04/11/2020 1700   HGBUR SMALL (A) 04/11/2020 1700   BILIRUBINUR NEGATIVE 04/11/2020 1700   KETONESUR NEGATIVE 04/11/2020 1700   PROTEINUR 30 (A) 04/11/2020 1700   NITRITE NEGATIVE 04/11/2020 1700   LEUKOCYTESUR MODERATE (A) 04/11/2020 1700   Sepsis Labs: @LABRCNTIP (procalcitonin:4,lacticidven:4) ) Recent Results (from the past 240 hour(s))  Resp Panel by RT-PCR (Flu A&B, Covid) Nasopharyngeal Swab     Status: Abnormal   Collection Time: 04/11/20  1:12 PM   Specimen: Nasopharyngeal Swab; Nasopharyngeal(NP) swabs in vial transport medium  Result Value Ref Range Status   SARS Coronavirus 2 by RT PCR NEGATIVE NEGATIVE Final    Comment: (NOTE)  SARS-CoV-2 target nucleic acids are NOT DETECTED.  The SARS-CoV-2 RNA is generally detectable in upper respiratory specimens during the acute phase of infection. The lowest concentration of SARS-CoV-2 viral copies this assay can detect is 138 copies/mL. A negative result does not preclude SARS-Cov-2 infection and should not be used as the sole basis for treatment or other patient management decisions. A negative result may occur with  improper specimen collection/handling, submission of specimen other than nasopharyngeal swab, presence of viral mutation(s) within the areas targeted by this assay, and inadequate number of viral copies(<138 copies/mL). A negative result must be combined with clinical observations, patient history, and epidemiological information. The expected result is Negative.  Fact Sheet for Patients:  BloggerCourse.com  Fact Sheet for Healthcare Providers:  SeriousBroker.it  This test is no t yet approved or cleared by the [place] FDA and  has been authorized for detection and/or diagnosis of SARS-CoV-2 by FDA under an Emergency Use Authorization (EUA). This EUA will remain  in effect  (meaning this test can be used) for the duration of the COVID-19 declaration under Section 564(b)(1) of the Act, 21 U.S.C.section 360bbb-3(b)(1), unless the authorization is terminated  or revoked sooner.       Influenza A by PCR POSITIVE (A) NEGATIVE Final   Influenza B by PCR NEGATIVE NEGATIVE Final    Comment: (NOTE) The Xpert Xpress SARS-CoV-2/FLU/RSV plus assay is intended as an aid in the diagnosis of influenza from Nasopharyngeal swab specimens and should not be used as a sole basis for treatment. Nasal washings and aspirates are unacceptable for Xpert Xpress SARS-CoV-2/FLU/RSV testing.  Fact Sheet for Patients: BloggerCourse.com  Fact Sheet for Healthcare Providers: SeriousBroker.it  This test is not yet approved or cleared by the [place] FDA and has been authorized for detection and/or diagnosis of SARS-CoV-2 by FDA under an Emergency Use Authorization (EUA). This EUA will remain in effect (meaning this test can be used) for the duration of the COVID-19 declaration under Section 564(b)(1) of the Act, 21 U.S.C. section 360bbb-3(b)(1), unless the authorization is terminated or revoked.  Performed at [facility], [address] [place], [place] [address]      Radiological Exams on Admission: CT HEAD WO CONTRAST  Result Date: 04/11/2020 CLINICAL DATA:  Confusion for a week. Diarrhea last weekend. Weakness. EXAM: CT HEAD WITHOUT CONTRAST TECHNIQUE: Contiguous axial images were obtained from the base of the skull through the vertex without intravenous contrast. COMPARISON:  Head CT, 06/07/2016 FINDINGS: Brain: No evidence of acute infarction, hemorrhage, hydrocephalus, extra-axial collection or mass lesion/mass effect. Small area of lucency in the right basal ganglia consistent with a old lacunar infarct. Minor areas of white matter hypoattenuation are noted bilaterally consistent with chronic microvascular  ischemic change. Vascular: No hyperdense vessel or unexpected calcification. Skull: Normal. Negative for fracture or focal lesion. Sinuses/Orbits: Globes and orbits are unremarkable. Visualized sinuses are clear. Other: None. IMPRESSION: 1. No acute intracranial abnormalities. Electronically Signed   By: Amie Portland M.D.   On: 04/11/2020 13:49   DG Chest Port 1 View  Result Date: 04/11/2020 CLINICAL DATA:  Questionable sepsis EXAM: PORTABLE CHEST 1 VIEW COMPARISON:  None. FINDINGS: The lungs are clear and negative for focal airspace consolidation, pulmonary edema or suspicious pulmonary nodule. No pleural effusion or pneumothorax. Cardiac and mediastinal contours are within normal limits. No acute fracture or lytic or blastic osseous lesions. The visualized upper abdominal bowel gas pattern is unremarkable. IMPRESSION: No active disease. Electronically Signed   By:  Malachy Moan M.D.   On: 04/11/2020 13:48     Assessment/Plan Principal Problem:   Influenza Active Problems:   Acute lower UTI   COPD (chronic obstructive pulmonary disease) (HCC)   GERD (gastroesophageal reflux disease)   Essential hypertension   AKI (acute kidney injury) (HCC)     #1 acute influenza A infection: Patient will be admitted for symptomatic management.  Hydrate patient.  Initiate Tamiflu for 5 days.  Mainly hydration and pain management.  #2 acute kidney injury: No documented history of chronic kidney disease.  No prior records in our system.  At this point patient is presumed to have AKI because of 5 days experience diarrhea and possible dehydration.  I will still check a renal ultrasound to rule out obstructive uropathy.  If none exist we will hydrate and consider nephrology consult if no immediate improvement  #3 COPD: No exacerbation  #4 GERD: Continue his PPIs  #5 essential hypertension: Patient was profoundly hypotensive in the ER.  Blood pressure now normal.  Hold antihypertensives for now  #6  possible UTI: Based on patient's urinalysis she may have had a UTI.  Continue empiric Rocephin     DVT prophylaxis: ***  Code Status: ***  Family Communication: ***  Disposition Plan: ***  Consults called: ***  Admission status: ***   Severity of Illness: {Observation/Inpatient:21159}   Jin Shockley,LAWAL MD [facility] Pager [phone]  If 7PM-7AM, please contact night-coverage [email or web address] Password [facility]  04/11/2020, 11:54 PM

## 2020-04-11 NOTE — ED Provider Notes (Signed)
[facility] EMERGENCY DEPARTMENT Provider Note   CSN: [id number] Arrival date & time: 04/11/20  1229     History Chief Complaint  Patient presents with  . Weakness    Vicki Swanson is a 74 y.o. female.  Presents with chief complaint of generalized weakness fatigue and diarrhea.  She lives at home with her husband at baseline does not use a walker.  Diarrhea was 5 days ago and has stopped 3 days ago.  However she has been weak and fatigued for the past 3 days.  No vomiting no fever.  Positive cough.  Patient is not immunized for Covid.  Husband was concerned that she appears more confused and weak and brought her in.        Past Medical History:  Diagnosis Date  . COPD (chronic obstructive pulmonary disease) (HCC)   . GERD (gastroesophageal reflux disease)   . Hypertension     Patient Active Problem List   Diagnosis Date Noted  . Influenza 04/11/2020    Past Surgical History:  Procedure Laterality Date  . ABDOMINAL HYSTERECTOMY    . BREAST SURGERY    . CHOLECYSTECTOMY    . TONSILLECTOMY       OB History   No obstetric history on file.     No family history on file.  Social History   Tobacco Use  . Smoking status: Never Smoker  . Smokeless tobacco: Never Used  Substance Use Topics  . Alcohol use: Never  . Drug use: Never    Home Medications Prior to Admission medications   Not on File    Allergies    Patient has no known allergies.  Review of Systems   Review of Systems  Constitutional: Negative for fever.  HENT: Negative for ear pain.   Eyes: Negative for pain.  Respiratory: Negative for cough.   Cardiovascular: Negative for chest pain.  Gastrointestinal: Negative for abdominal pain.  Genitourinary: Negative for flank pain.  Musculoskeletal: Negative for back pain.  Skin: Negative for rash.  Neurological: Negative for headaches.    Physical Exam Updated Vital Signs BP (!) 103/52   Pulse 65   Temp (!) 97.2 F (36.2 C)    Resp 17   Ht 5' (1.524 m)   Wt 65.3 kg   SpO2 100%   BMI 28.12 kg/m   Physical Exam Constitutional:      General: She is not in acute distress.    Appearance: Normal appearance.     Comments: Patient appears fatigued and is sleeping.  She is easily arousable, becomes awake and alert and answers all questions but does appear slightly confused.  When asked what year was, she stated it was 84.  She did recall who the president was President Biden correctly.  She does recognize her husband correctly.  She follows all commands.  However her husband says she appears more confused than her typical baseline.  HENT:     Head: Normocephalic.     Nose: Nose normal.  Eyes:     Extraocular Movements: Extraocular movements intact.  Cardiovascular:     Rate and Rhythm: Normal rate.  Pulmonary:     Effort: Pulmonary effort is normal.  Abdominal:     Palpations: Abdomen is soft.     Tenderness: There is no abdominal tenderness.  Musculoskeletal:        General: Normal range of motion.     Cervical back: Normal range of motion.  Skin:    General: Skin is warm.  Neurological:     General: No focal deficit present.     Comments: generalized weakness bilateral upper and lower extremities.  She is able to lift extremities against gravity, but cannot lift remedy very high.  Cranial nerves II to XII otherwise intact.     ED Results / Procedures / Treatments   Labs (all labs ordered are listed, but only abnormal results are displayed) Labs Reviewed  RESP PANEL BY RT-PCR (FLU A&B, COVID) ARPGX2 - Abnormal; Notable for the following components:      Result Value   Influenza A by PCR POSITIVE (*)    All other components within normal limits  CBC - Abnormal; Notable for the following components:   RBC 5.12 (*)    Hemoglobin 15.9 (*)    All other components within normal limits  COMPREHENSIVE METABOLIC PANEL - Abnormal; Notable for the following components:   Sodium 134 (*)    CO2 10 (*)     Glucose, Bld 124 (*)    BUN 105 (*)    Creatinine, Ser 10.72 (*)    Calcium 8.7 (*)    AST 11 (*)    GFR, Estimated 3 (*)    Anion gap 19 (*)    All other components within normal limits  CBG MONITORING, ED - Abnormal; Notable for the following components:   Glucose-Capillary 110 (*)    All other components within normal limits  URINE CULTURE  CULTURE, BLOOD (ROUTINE X 2)  CULTURE, BLOOD (ROUTINE X 2)  PROTIME-INR  APTT  DIFFERENTIAL  LACTIC ACID, PLASMA  LACTIC ACID, PLASMA  URINALYSIS, ROUTINE W REFLEX MICROSCOPIC    EKG EKG Interpretation  Date/Time:  Friday April 11 2020 12:56:57 EST Ventricular Rate:  59 PR Interval:    QRS Duration: 87 QT Interval:  437 QTC Calculation: 433 R Axis:   54 Text Interpretation: Sinus rhythm Atrial premature complexes Low voltage, precordial leads Confirmed by Norman Clay ([id number]) on 04/11/2020 1:00:59 PM   Radiology CT HEAD WO CONTRAST  Result Date: 04/11/2020 CLINICAL DATA:  Confusion for a week. Diarrhea last weekend. Weakness. EXAM: CT HEAD WITHOUT CONTRAST TECHNIQUE: Contiguous axial images were obtained from the base of the skull through the vertex without intravenous contrast. COMPARISON:  Head CT, 06/07/2016 FINDINGS: Brain: No evidence of acute infarction, hemorrhage, hydrocephalus, extra-axial collection or mass lesion/mass effect. Small area of lucency in the right basal ganglia consistent with a old lacunar infarct. Minor areas of white matter hypoattenuation are noted bilaterally consistent with chronic microvascular ischemic change. Vascular: No hyperdense vessel or unexpected calcification. Skull: Normal. Negative for fracture or focal lesion. Sinuses/Orbits: Globes and orbits are unremarkable. Visualized sinuses are clear. Other: None. IMPRESSION: 1. No acute intracranial abnormalities. Electronically Signed   By: Amie Portland M.D.   On: 04/11/2020 13:49   DG Chest Port 1 View  Result Date: 04/11/2020 CLINICAL DATA:   Questionable sepsis EXAM: PORTABLE CHEST 1 VIEW COMPARISON:  None. FINDINGS: The lungs are clear and negative for focal airspace consolidation, pulmonary edema or suspicious pulmonary nodule. No pleural effusion or pneumothorax. Cardiac and mediastinal contours are within normal limits. No acute fracture or lytic or blastic osseous lesions. The visualized upper abdominal bowel gas pattern is unremarkable. IMPRESSION: No active disease. Electronically Signed   By: Malachy Moan M.D.   On: 04/11/2020 13:48    Procedures .Critical Care Performed by: Cheryll Cockayne, MD Authorized by: Cheryll Cockayne, MD   Critical care provider statement:    Critical care time (  minutes):  40   Critical care time was exclusive of:  Separately billable procedures and treating other patients and teaching time   Critical care was necessary to treat or prevent imminent or life-threatening deterioration of the following conditions:  CNS failure or compromise and renal failure   (including critical care time)  Medications Ordered in ED Medications  0.9 %  sodium chloride infusion ( Intravenous New Bag/Given 04/11/20 1315)  piperacillin-tazobactam (ZOSYN) IVPB 3.375 g ( Intravenous Stopped 04/11/20 1415)  lactated ringers bolus 1,000 mL (1,000 mLs Intravenous New Bag/Given 04/11/20 1308)  sodium chloride 0.9 % bolus 1,000 mL (1,000 mLs Intravenous New Bag/Given 04/11/20 1340)  oseltamivir (TAMIFLU) capsule 75 mg (75 mg Oral Given 04/11/20 1445)    ED Course  I have reviewed the triage vital signs and the nursing notes.  Pertinent labs & imaging results that were available during my care of the patient were reviewed by me and considered in my medical decision making (see chart for details).    MDM Rules/Calculators/A&P                          Patient is hypotensive on arrival concern for sepsis.  Sepsis protocol initiated, however lactic acid normal white count normal.  She is positive for influenza.  Given  Tamiflu here which she tolerated well, blood pressure improved with IV fluids.  Hypothermia improved with warm blankets.  Vital signs have improved and patient is stable for admission.  Foley catheter was placed but no urine output noted yet.  Consultation with hospitalist made.  Consultation with nephrology requested.   Final Clinical Impression(s) / ED Diagnoses Final diagnoses:  Influenza  Encephalopathy acute  Acute kidney injury [facility])    Rx / DC Orders ED Discharge Orders    None       Cheryll Cockayne, MD 04/11/20 1446

## 2020-04-12 ENCOUNTER — Inpatient Hospital Stay (HOSPITAL_COMMUNITY): Payer: Medicare Other

## 2020-04-12 DIAGNOSIS — I10 Essential (primary) hypertension: Secondary | ICD-10-CM

## 2020-04-12 DIAGNOSIS — N179 Acute kidney failure, unspecified: Secondary | ICD-10-CM

## 2020-04-12 DIAGNOSIS — K219 Gastro-esophageal reflux disease without esophagitis: Secondary | ICD-10-CM

## 2020-04-12 DIAGNOSIS — J449 Chronic obstructive pulmonary disease, unspecified: Secondary | ICD-10-CM

## 2020-04-12 DIAGNOSIS — N390 Urinary tract infection, site not specified: Secondary | ICD-10-CM

## 2020-04-12 LAB — CBC
HCT: 34.4 % — ABNORMAL LOW (ref 36.0–46.0)
Hemoglobin: 12.4 g/dL (ref 12.0–15.0)
MCH: 31.6 pg (ref 26.0–34.0)
MCHC: 36 g/dL (ref 30.0–36.0)
MCV: 87.5 fL (ref 80.0–100.0)
Platelets: 118 10*3/uL — ABNORMAL LOW (ref 150–400)
RBC: 3.93 MIL/uL (ref 3.87–5.11)
RDW: 13.6 % (ref 11.5–15.5)
WBC: 6.2 10*3/uL (ref 4.0–10.5)
nRBC: 0 % (ref 0.0–0.2)

## 2020-04-12 LAB — COMPREHENSIVE METABOLIC PANEL
ALT: 9 U/L (ref 0–44)
AST: 11 U/L — ABNORMAL LOW (ref 15–41)
Albumin: 3 g/dL — ABNORMAL LOW (ref 3.5–5.0)
Alkaline Phosphatase: 68 U/L (ref 38–126)
Anion gap: 14 (ref 5–15)
BUN: 95 mg/dL — ABNORMAL HIGH (ref 8–23)
CO2: 10 mmol/L — ABNORMAL LOW (ref 22–32)
Calcium: 8 mg/dL — ABNORMAL LOW (ref 8.9–10.3)
Chloride: 115 mmol/L — ABNORMAL HIGH (ref 98–111)
Creatinine, Ser: 8.41 mg/dL — ABNORMAL HIGH (ref 0.44–1.00)
GFR, Estimated: 5 mL/min — ABNORMAL LOW (ref >60)
Glucose, Bld: 121 mg/dL — ABNORMAL HIGH (ref 70–99)
Potassium: 4 mmol/L (ref 3.5–5.1)
Sodium: 139 mmol/L (ref 135–145)
Total Bilirubin: 0.4 mg/dL (ref 0.3–1.2)
Total Protein: 5.6 g/dL — ABNORMAL LOW (ref 6.5–8.1)

## 2020-04-12 LAB — GLUCOSE, CAPILLARY: Glucose-Capillary: 126 mg/dL — ABNORMAL HIGH (ref 70–99)

## 2020-04-12 IMAGING — DX DG ABDOMEN 1V
1 series · 1 of 1 positions shown · non-contrast
Comparison: None.

CLINICAL DATA: Metal implant.  MRI.

EXAM:
ABDOMEN - 1 VIEW

[abdomen supine]
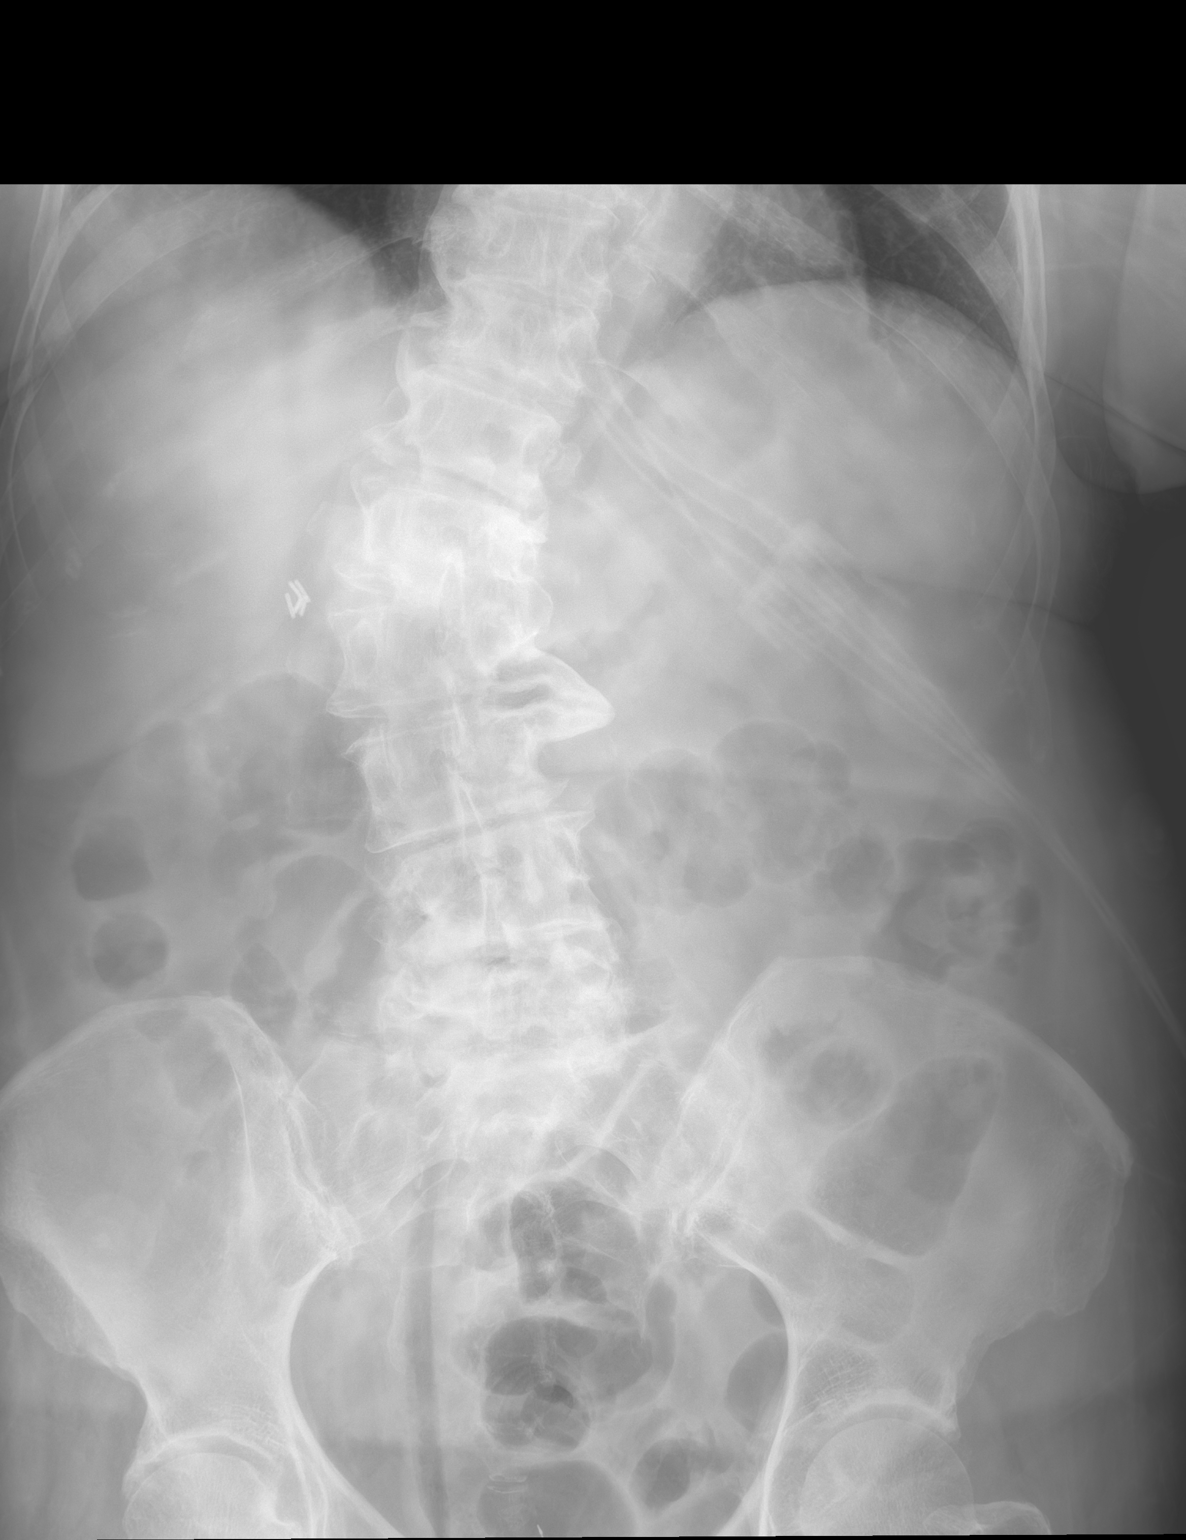

[1 of 1 positions shown; findings below may reference images not displayed]

FINDINGS: There are metallic clips in the right upper quadrant likely related
to prior cholecystectomy. These are not a contraindication to MRI.
There is a degenerative dextroscoliosis centered in the mid lumbar
spine with advanced degenerative changes of the lumbar spine. The
bowel gas pattern is nonobstructive.
IMPRESSION: Metallic clips in the right upper quadrant likely related to prior
cholecystectomy. These are not a contraindication to MRI.

Nonobstructive bowel gas pattern.

## 2020-04-12 IMAGING — CT CT HEAD CODE STROKE
3 series · 15 of 47 positions shown, 18 images · non-contrast
Comparison: None.

CLINICAL DATA: Code stroke. Initial evaluation for acute facial
droop, aphasia. Of

EXAM:
CT HEAD WITHOUT CONTRAST
TECHNIQUE: Contiguous axial images were obtained from the base of the skull
through the vertex without intravenous contrast.

[Series 3: head 5.0 st · axial · 0.43mm/px · z∈[+1053,+1188]mm · 9 of 33 slices shown, 12 images]
[im 3/33  brain]
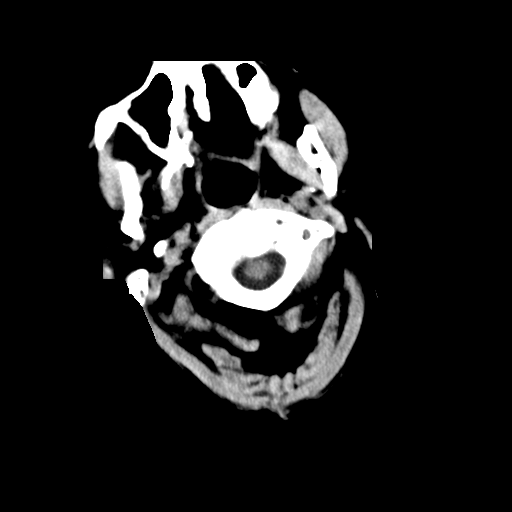
[im 3/33  bone]
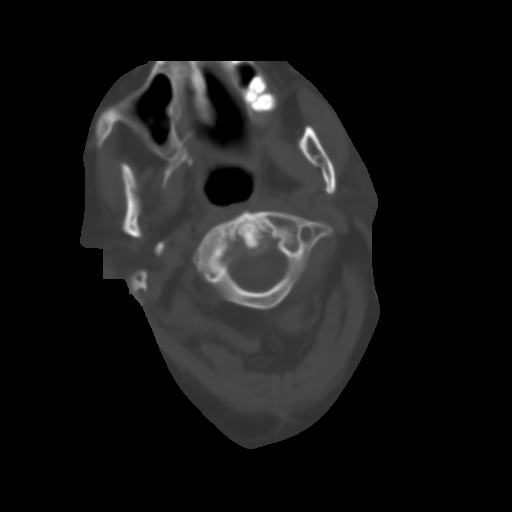
[im 6/33  brain]
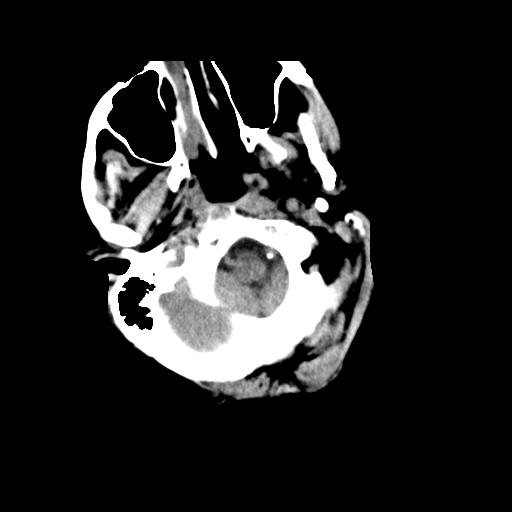
[im 9/33  brain]
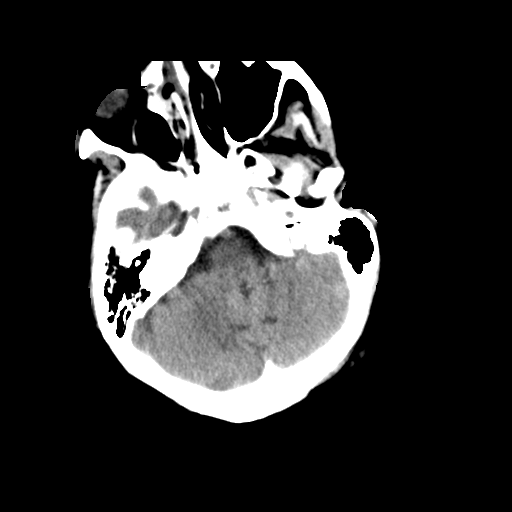
[im 13/33  brain]
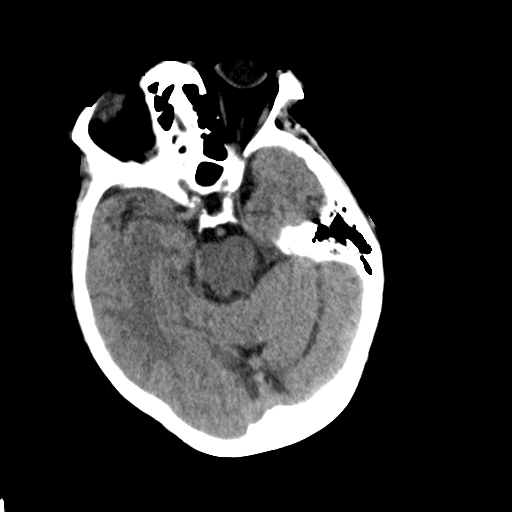
[im 17/33  brain]
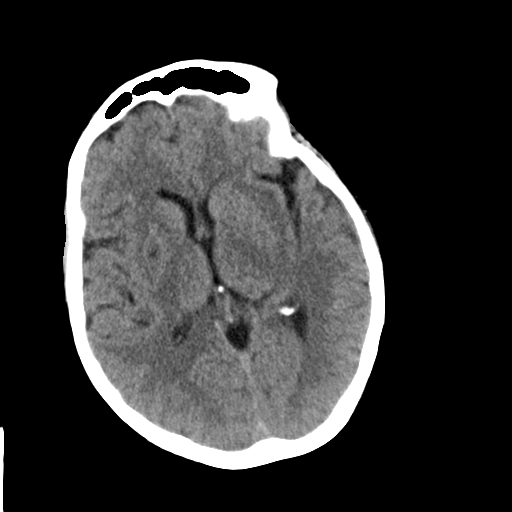
[im 17/33  bone]
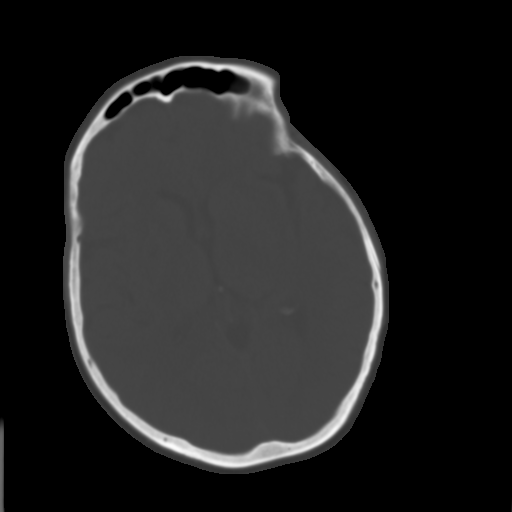
[im 20/33  brain]
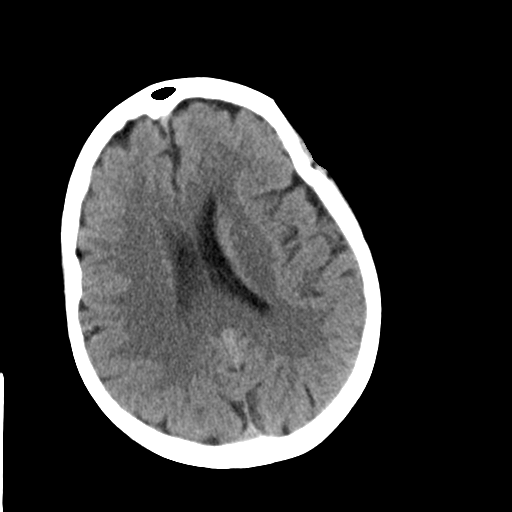
[im 24/33  brain]
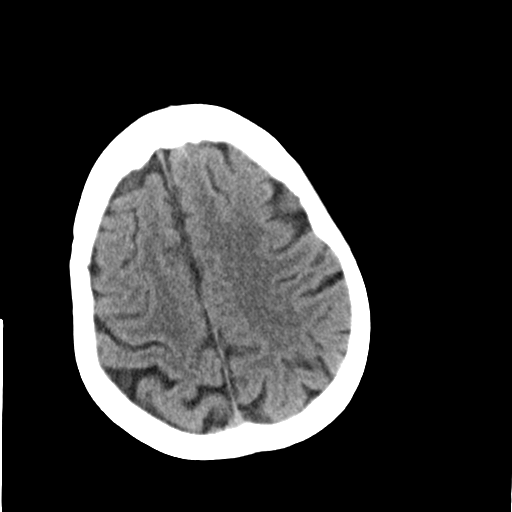
[im 27/33  brain]
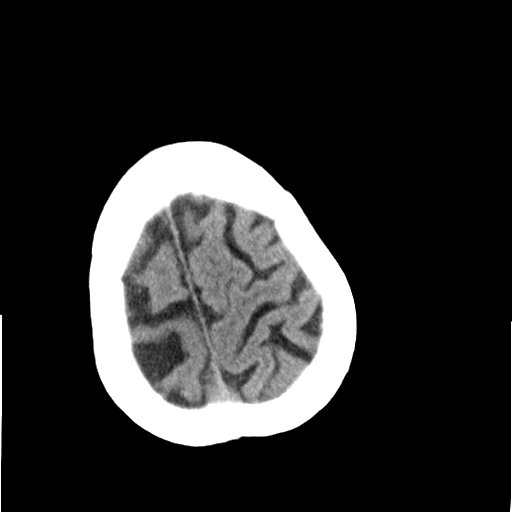
[im 30/33  brain]
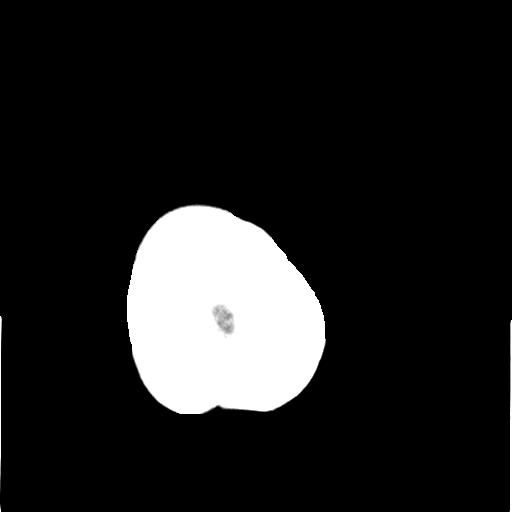
[im 30/33  bone]
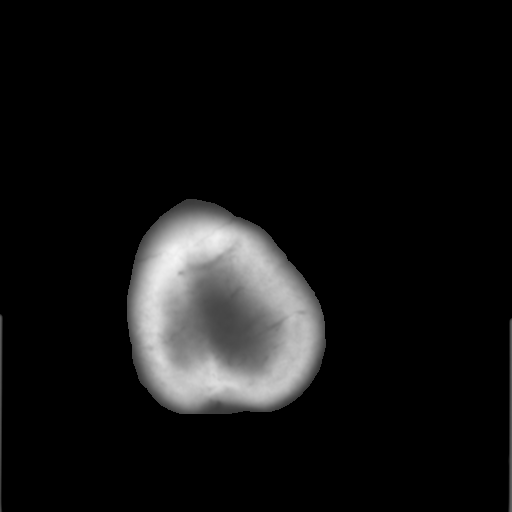

[Series 5: head 3.0 cor st · coronal · 0.32mm/px · 3 of 71 slices shown]
[im 24/71  brain]
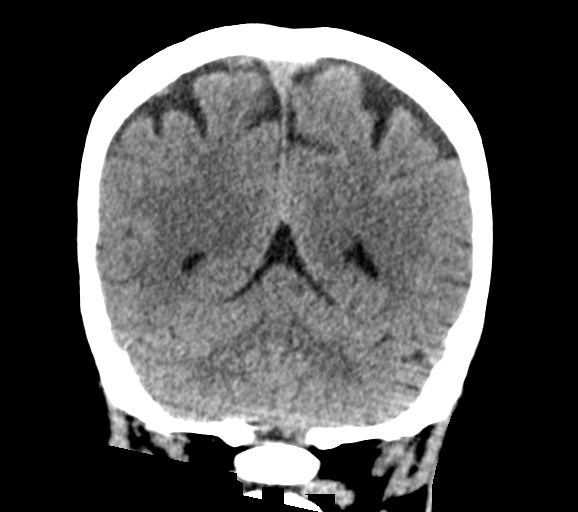
[im 32/71  brain]
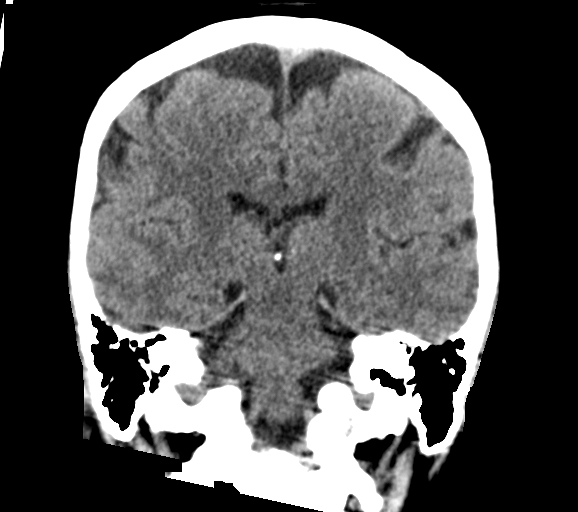
[im 39/71  brain]
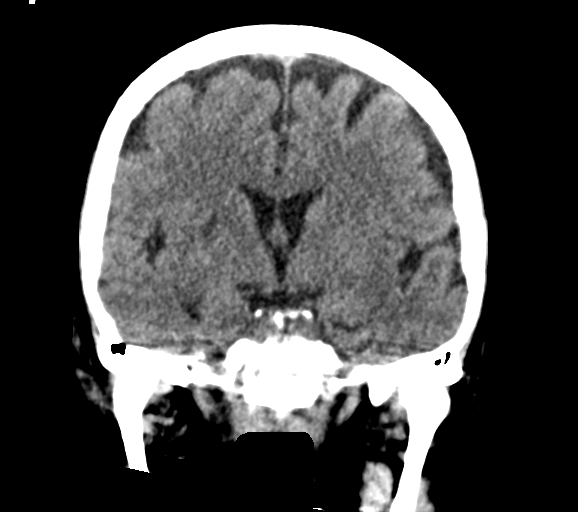

[Series 6: head 3.0 sag st · sagittal · 0.34mm/px · 3 of 63 slices shown]
[im 28/63  brain]
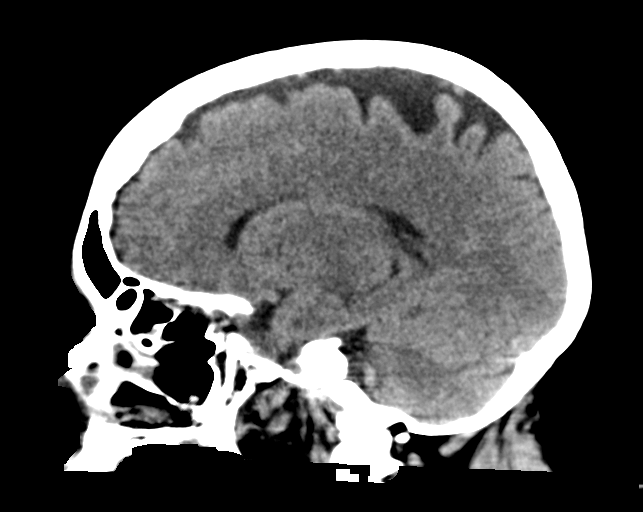
[im 34/63  brain]
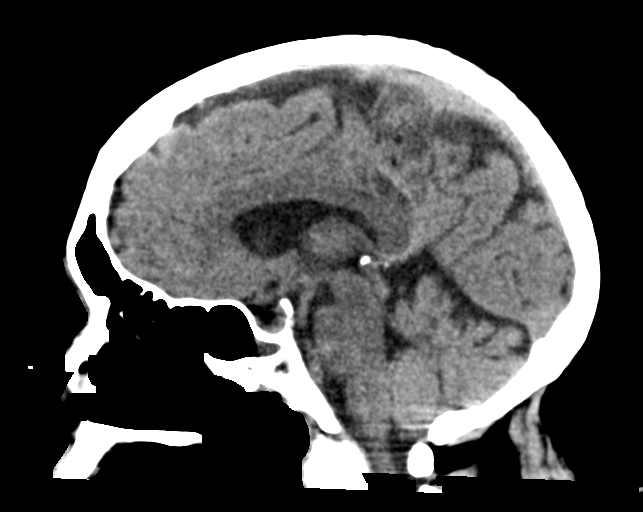
[im 40/63  brain]
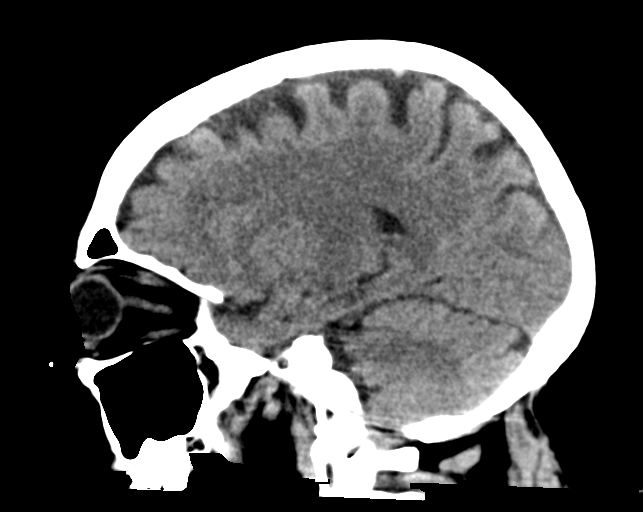

[15 of 47 positions shown; findings below may reference images not displayed]

FINDINGS: Brain: Cerebral volume within normal limits for age. Mild chronic
microvascular ischemic disease noted within the periventricular
white matter. Small remote lacunar infarct present at the right
lentiform nucleus.

No acute intracranial hemorrhage. No acute large vessel territory
infarct. No mass lesion, midline shift or mass effect. No
hydrocephalus or extra-axial fluid collection.

Vascular: No hyperdense vessel. Mild calcified atherosclerosis at
the skull base.

Skull: Scalp soft tissues and calvarium within normal limits.

Sinuses/Orbits: Globes and orbital soft tissues are normal. Mild
scattered mucosal thickening noted within the paranasal sinuses,
with small volume opacity within the left sphenoid sinus. Mastoid
air cells and middle ear cavities are clear.

Other: None.

ASPECTS (Alberta Stroke Program Early CT Score)

- Ganglionic level infarction (caudate, lentiform nuclei, internal
capsule, insula, M1-M3 cortex): 7

- Supraganglionic infarction (M4-M6 cortex): 3

Total score (0-10 with 10 being normal): 10
IMPRESSION: 1. No acute intracranial abnormality.
2. ASPECTS is 10.
3. Mild chronic small vessel ischemic disease, with small remote
lacunar infarct at the right lentiform nucleus.

These results were communicated to Dr. FERIENHAUS at [DATE] pmon
[DATE]by text page via the AMION messaging system.

## 2020-04-12 IMAGING — MR MR HEAD W/O CM
12 of 13 series · 44 of 48 positions shown · non-contrast
Comparison: Prior CT from [DATE].

CLINICAL DATA: Initial evaluation for neuro deficit, stroke
suspected.

EXAM:
MRI HEAD WITHOUT CONTRAST
TECHNIQUE: Multiplanar, multiecho pulse sequences of the brain and surrounding
structures were obtained without intravenous contrast.

[Series 5: DWI · axial · 3.0mm · 0.88mm/px · z∈[-70,+64]mm · 8 of 96 slices shown (1 of 4)]
[im 1/96]
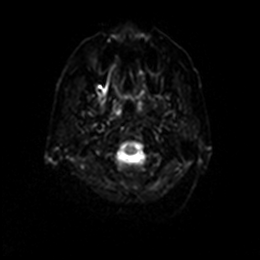
[im 14/96]
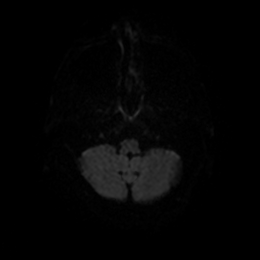
[im 28/96]
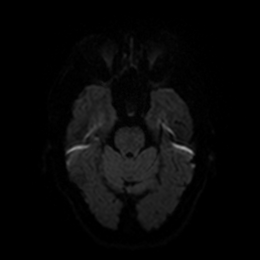
[im 41/96]
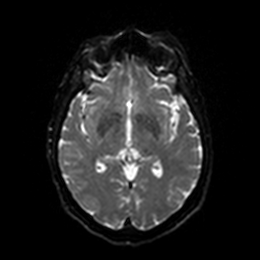
[im 55/96]
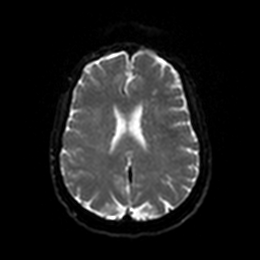
[im 68/96]
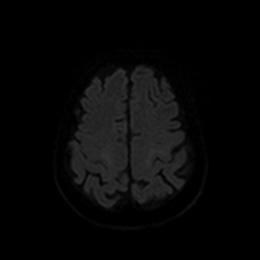
[im 82/96]
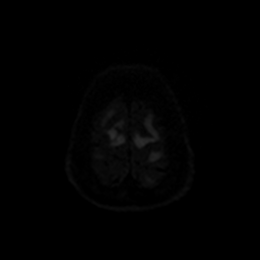
[im 96/96]
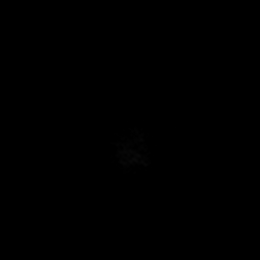

[Series 6: DWI · axial · 3.0mm · 0.88mm/px · z∈[-70,+64]mm · 4 of 48 slices shown (2 of 4)]
[im 1/48]
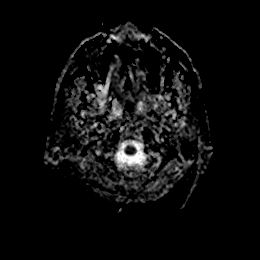
[im 16/48]
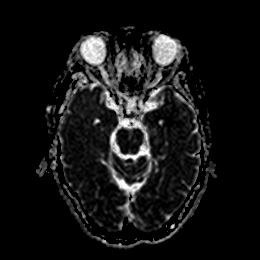
[im 32/48]
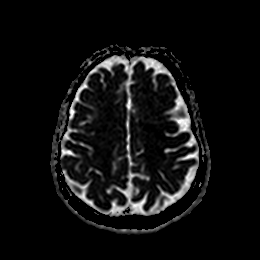
[im 48/48]
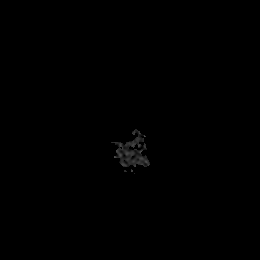

[Series 7: DWI · coronal · 4.0mm · 0.88mm/px · 5 of 66 slices shown (3 of 4)]
[im 1/66]
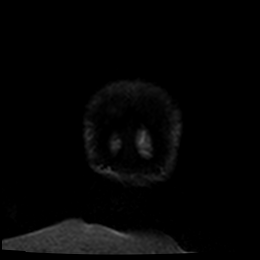
[im 17/66]
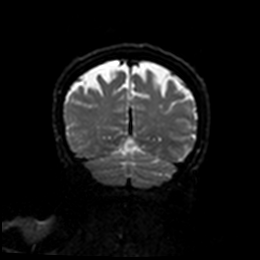
[im 33/66]
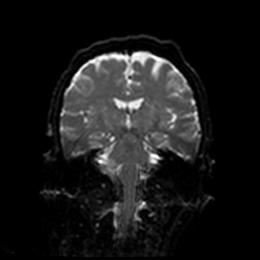
[im 49/66]
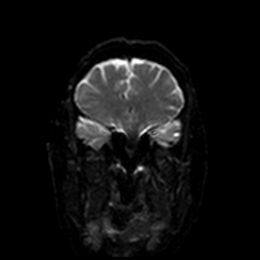
[im 66/66]
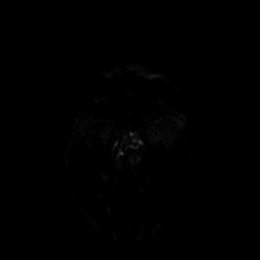

[Series 8: DWI · coronal · 4.0mm · 0.88mm/px · 3 of 33 slices shown (4 of 4)]
[im 1/33]
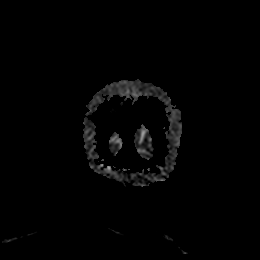
[im 17/33]
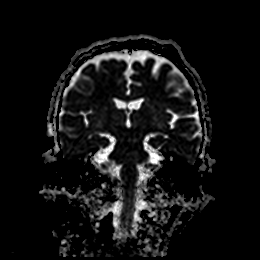
[im 33/33]
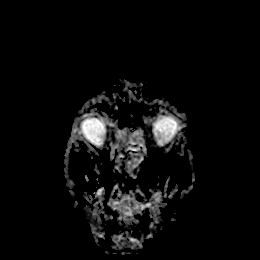

[Series 9: T1 · sagittal · 5.0mm · 0.75mm/px · 2 of 25 slices shown]
[im 1/25]
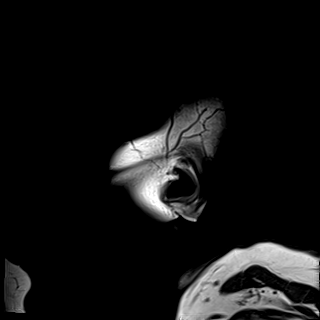
[im 25/25]
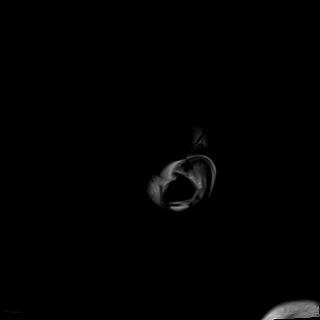

[Series 10: T2 · axial · 5.0mm · 0.72mm/px · z∈[-73,+64]mm · 2 of 25 slices shown (1 of 2)]
[im 1/25]
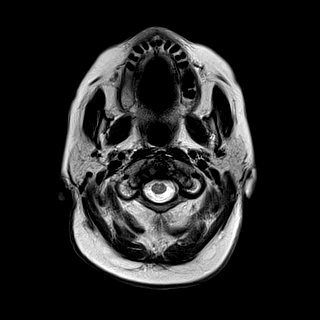
[im 25/25]
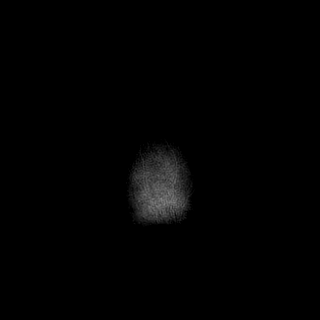

[Series 11: mag_images · axial · 3.0mm · 0.90mm/px · z∈[-74,+71]mm · 4 of 52 slices shown]
[im 1/52]
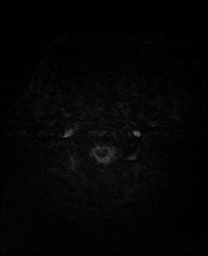
[im 18/52]
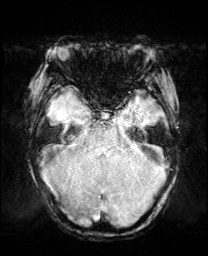
[im 35/52]
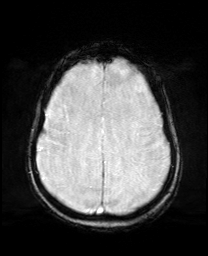
[im 52/52]
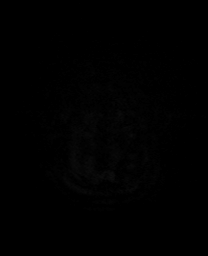

[Series 12: pha_images · axial · 3.0mm · 0.90mm/px · z∈[-69,+71]mm · 4 of 50 slices shown]
[im 1/50]
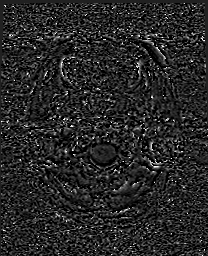
[im 17/50]
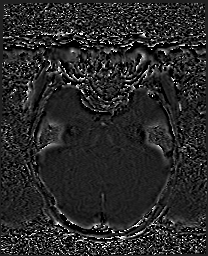
[im 33/50]
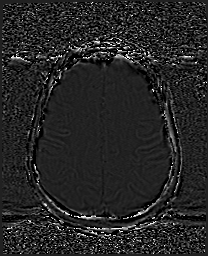
[im 50/50]
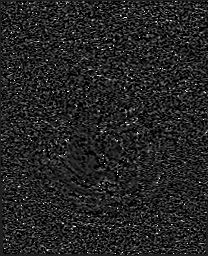

[Series 13: swi_images · axial · 3.0mm · 0.90mm/px · z∈[-74,+71]mm · 4 of 52 slices shown]
[im 1/52]
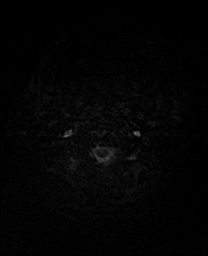
[im 18/52]
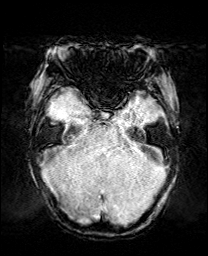
[im 35/52]
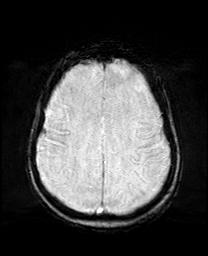
[im 52/52]
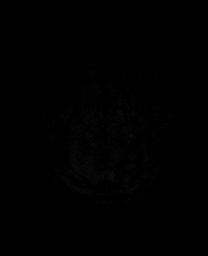

[Series 14: mip_images(sw) · axial · 24.0mm · 0.90mm/px · z∈[-64,+61]mm · 4 of 45 slices shown]
[im 1/45]
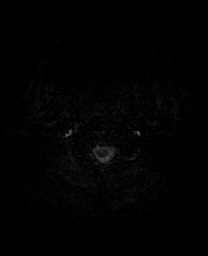
[im 15/45]
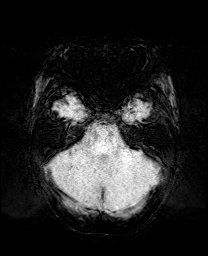
[im 30/45]
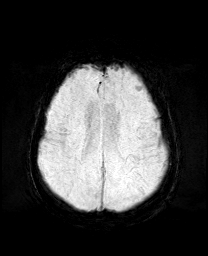
[im 45/45]
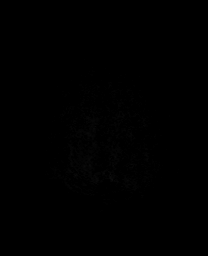

[Series 15: FLAIR · axial · 5.0mm · 0.90mm/px · z∈[-73,+64]mm · 2 of 25 slices shown]
[im 1/25]
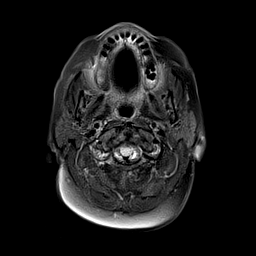
[im 25/25]
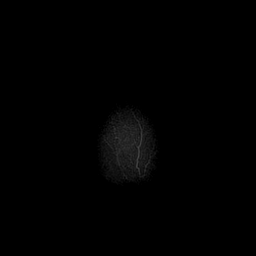

[Series 17: T2 · coronal · 5.0mm · 0.34mm/px · 2 of 29 slices shown (2 of 2)]
[im 1/29]
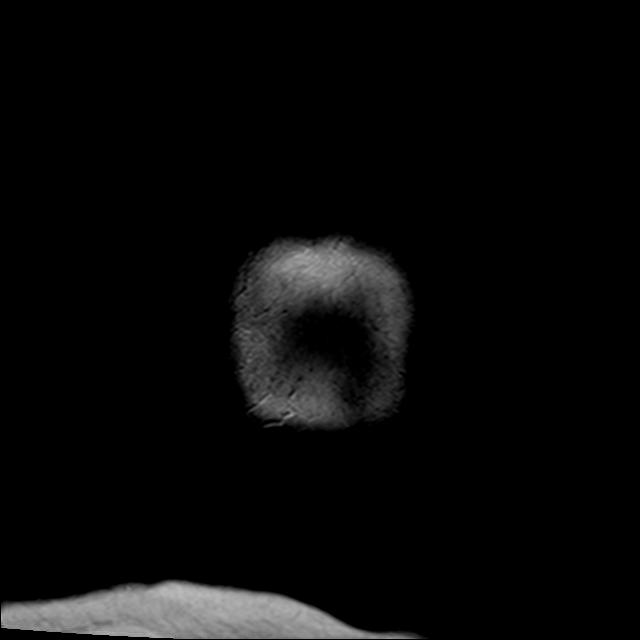
[im 29/29]
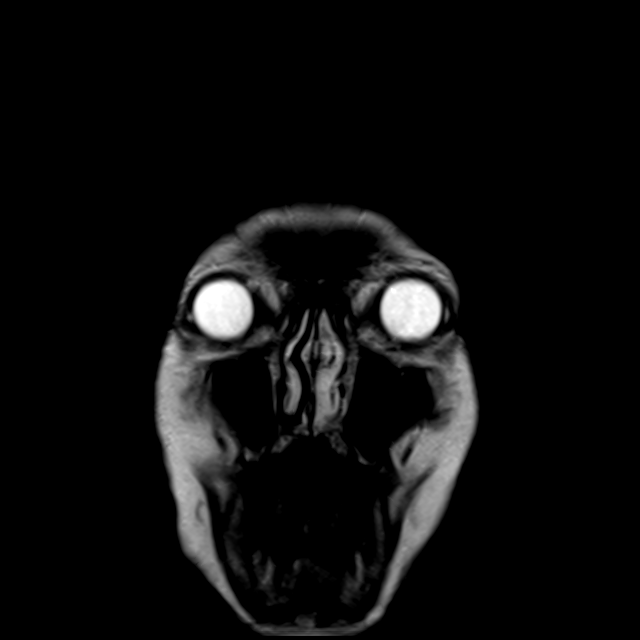

[44 of 48 positions shown; findings below may reference images not displayed]

FINDINGS: Brain: Diffuse prominence of the CSF containing spaces compatible
with generalized age-related cerebral atrophy. Mild scattered patchy
T2/FLAIR hyperintensity within the periventricular and deep white
matter both cerebral hemispheres as well as the pons, most likely
related chronic microvascular ischemic disease, mild for age.
Superimposed small remote lacunar infarct at the right lentiform
nucleus.

No abnormal foci of restricted diffusion to suggest acute or
subacute ischemia. Gray-white matter differentiation maintained. No
encephalomalacia to suggest chronic cortical infarction. No foci of
susceptibility artifact to suggest acute or chronic intracranial
hemorrhage.

No mass lesion, midline shift or mass effect. No hydrocephalus or
extra-axial fluid collection. Pituitary gland suprasellar region
normal. Midline structures intact.

Vascular: Major intracranial vascular flow voids are maintained.

Skull and upper cervical spine: Craniocervical junction within
normal limits. Bone marrow signal intensity normal. No scalp soft
tissue abnormality.

Sinuses/Orbits: Patient status post bilateral ocular lens
replacement. Globes and orbital soft tissues demonstrate no acute
finding. Mild scattered mucosal thickening noted within the
ethmoidal air cells, sphenoid sinuses, and maxillary sinuses.
Air-fluid level within the left sphenoid sinus suggestive of acute
sinusitis. No significant mastoid effusion. Inner ear structures
normal.

Other: None.
IMPRESSION: 1. No acute intracranial abnormality.
2. Age-related cerebral atrophy with mild chronic small vessel
ischemic disease. Superimposed small remote lacunar infarct at the
right lentiform nucleus.
3. Air-fluid level within the left sphenoid sinus suggestive of
acute sinusitis.

## 2020-04-12 IMAGING — US US RENAL
1 series · 14 of 25 positions shown · non-contrast
Comparison: None.

CLINICAL DATA: Acute renal failure

EXAM:
RENAL / URINARY TRACT ULTRASOUND COMPLETE

[Series 1: us renal · 14 of 54 slices shown]
[im 1/54]
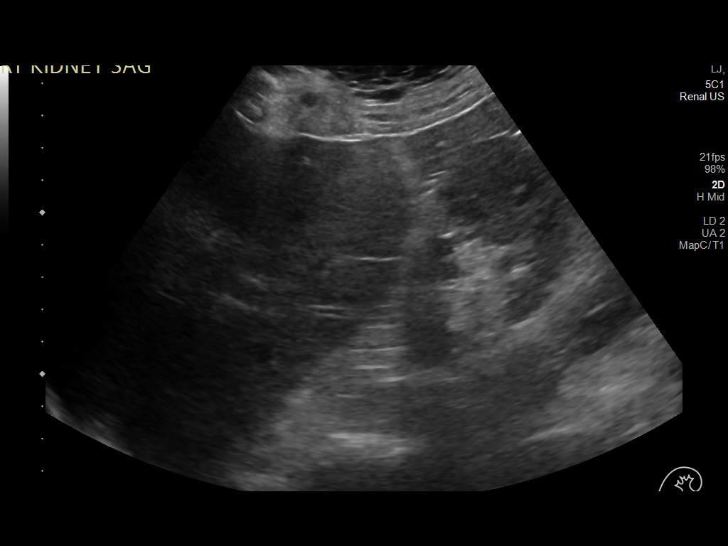
[im 5/54]
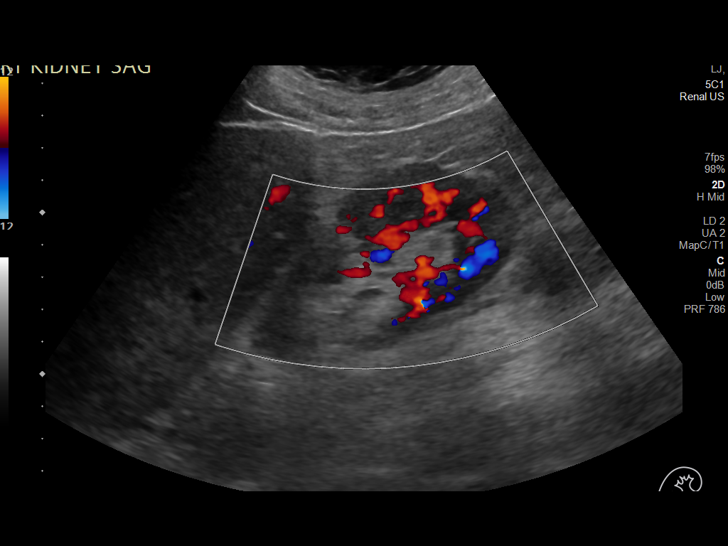
[im 9/54]
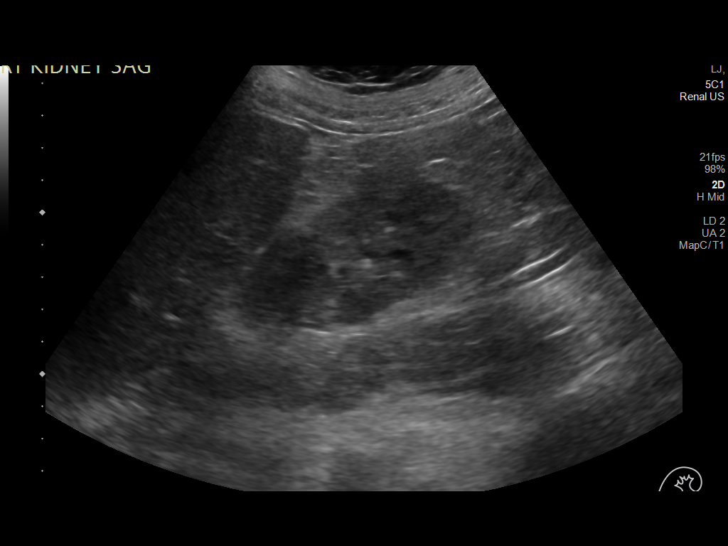
[im 14/54]
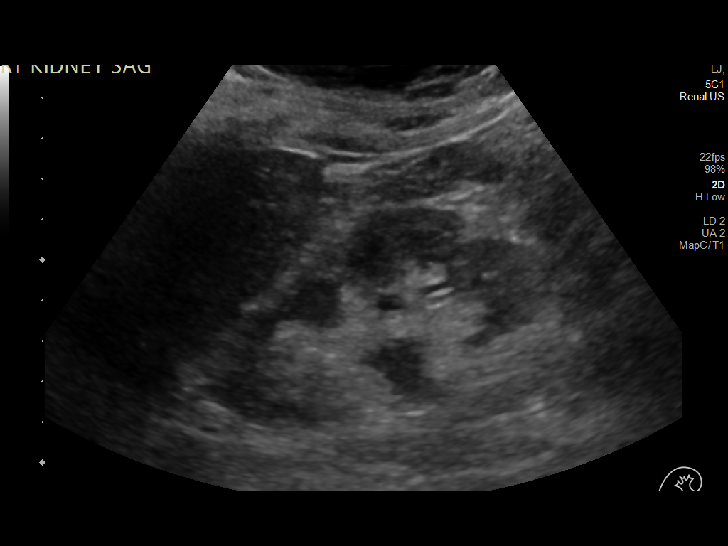
[im 18/54]
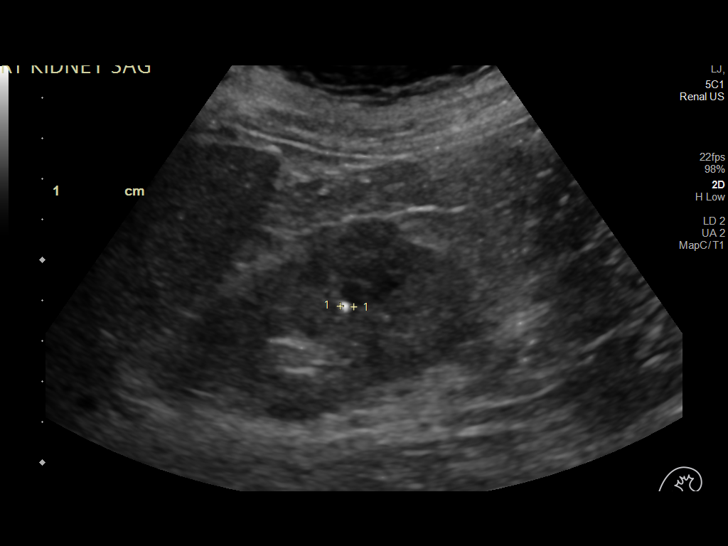
[im 20/54]
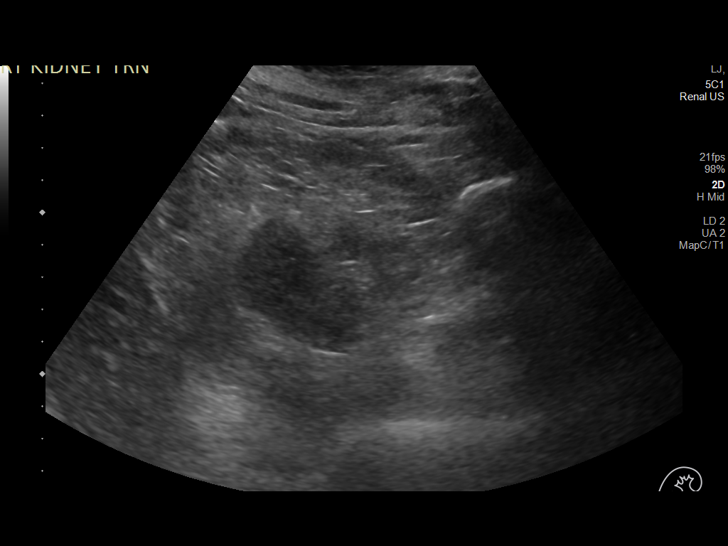
[im 25/54]
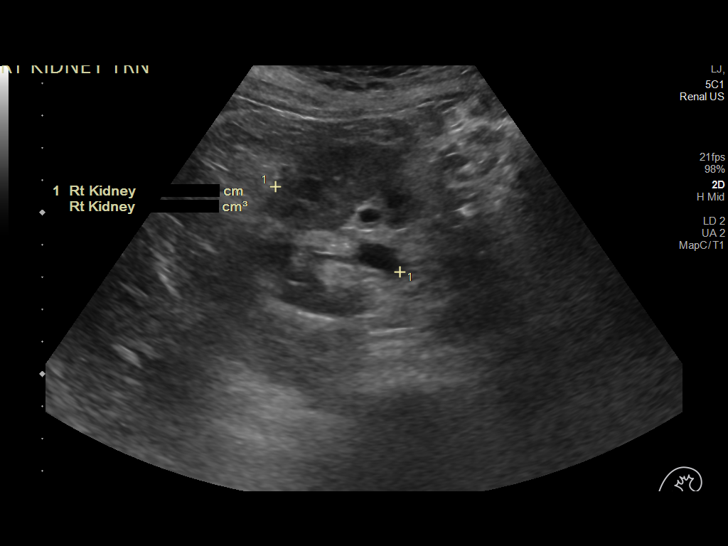
[im 29/54]
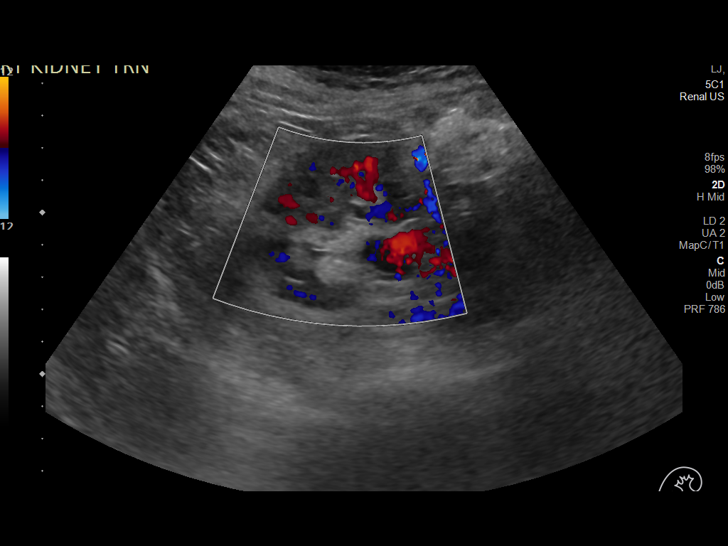
[im 34/54]
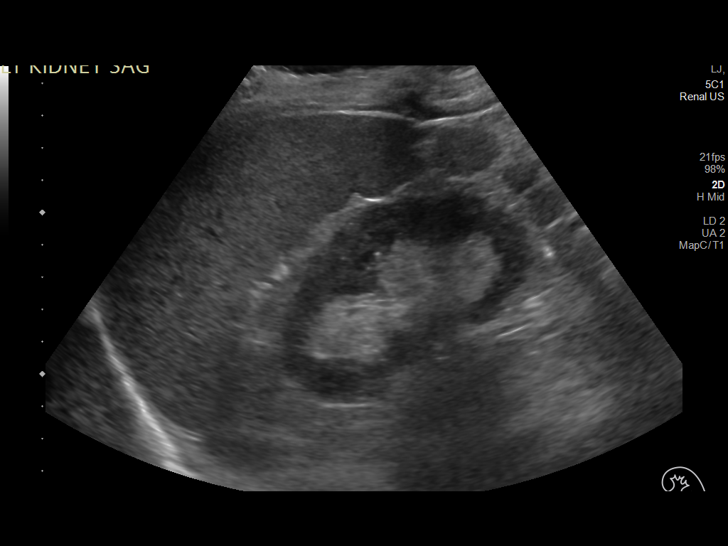
[im 36/54]
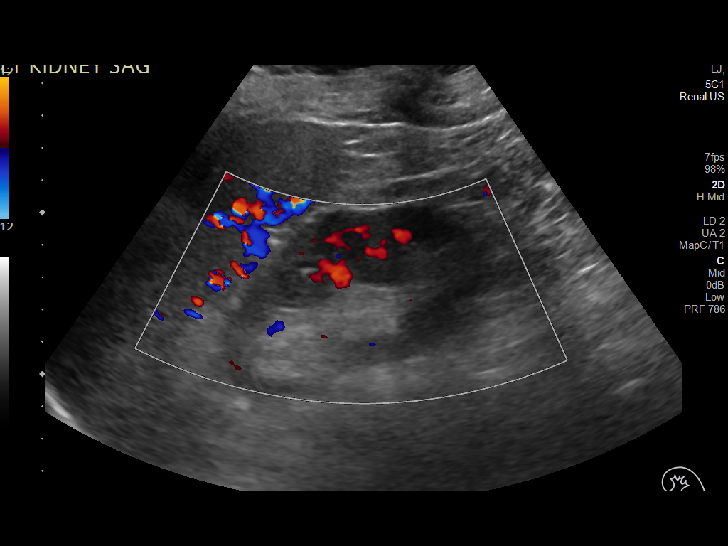
[im 40/54]
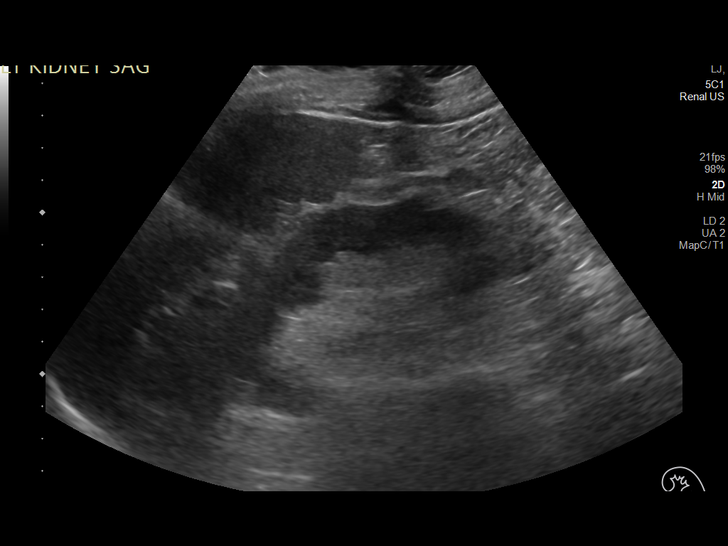
[im 45/54]
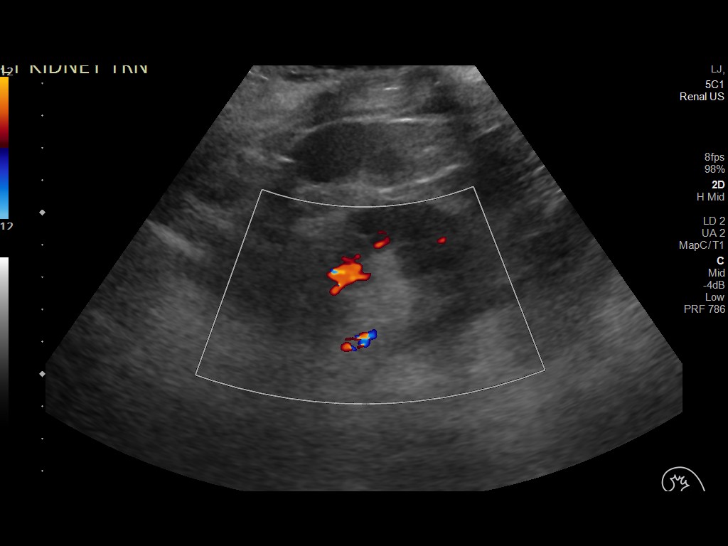
[im 49/54]
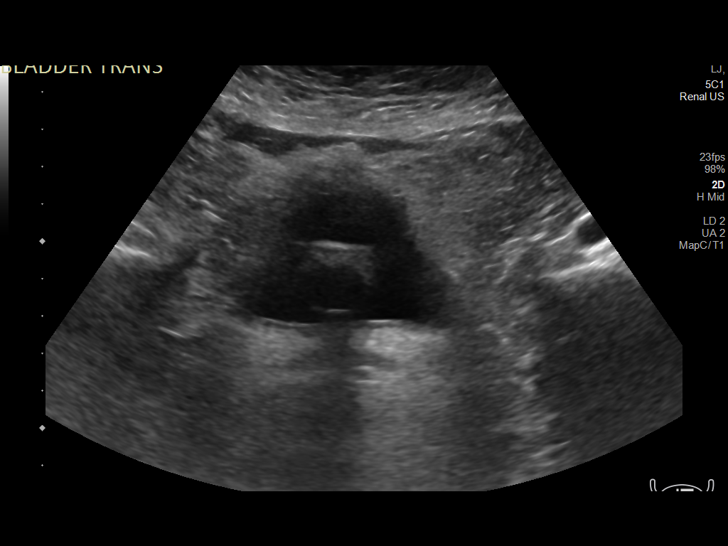
[im 54/54]
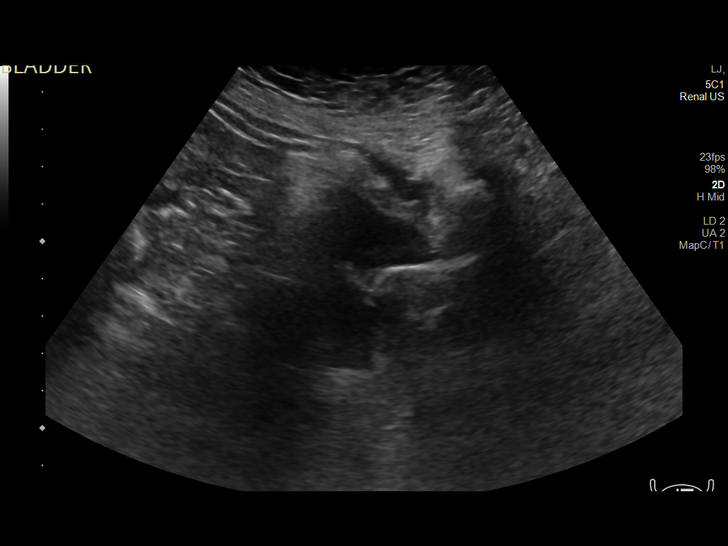

[14 of 25 positions shown; findings below may reference images not displayed]

FINDINGS: Right Kidney:

Renal measurements: 9.1 x 4.6 x 4.7 cm = volume: 102 mL.
Echogenicity appears within normal limits. There is mild right
hydronephrosis. There is a 0.3 cm echogenic focus which is
nonspecific but possibly represents a small stone. No renal mass.

Left Kidney:

Renal measurements: 8.8 x 4.7 x 4.2 cm = volume: 90 mL. Echogenicity
appears within normal limits. No hydronephrosis. No renal calculus.
No renal mass identified.

Bladder:

Decompressed with Foley catheter in place.

Other:

None.
IMPRESSION: 1. Mild right kidney hydronephrosis. There is a 0.3 cm echogenic
focus in the right kidney which could represent a small stone.

2.  Unremarkable appearance of the left kidney.

## 2020-04-12 MED ORDER — DEXTROSE-NACL 5-0.9 % IV SOLN: INTRAVENOUS | Status: DC

## 2020-04-12 MED ORDER — OSELTAMIVIR PHOSPHATE 30 MG PO CAPS
30.0000 mg | ORAL_CAPSULE | ORAL | Status: AC
  Administered 2020-04-14: 30 mg via ORAL
  Filled 2020-04-12 (×2): qty 1

## 2020-04-12 MED ORDER — GUAIFENESIN ER 600 MG PO TB12
600.0000 mg | ORAL_TABLET | Freq: Two times a day (BID) | ORAL | Status: DC
  Administered 2020-04-12 – 2020-04-17 (×10): 600 mg via ORAL
  Filled 2020-04-12 (×11): qty 1

## 2020-04-12 MED ORDER — ALBUTEROL SULFATE (2.5 MG/3ML) 0.083% IN NEBU
2.5000 mg | INHALATION_SOLUTION | RESPIRATORY_TRACT | Status: DC | PRN
  Administered 2020-04-15: 17:00:00 2.5 mg via RESPIRATORY_TRACT
  Filled 2020-04-12: qty 3

## 2020-04-12 MED ORDER — CHLORHEXIDINE GLUCONATE CLOTH 2 % EX PADS
6.0000 | MEDICATED_PAD | Freq: Every day | CUTANEOUS | Status: DC
  Administered 2020-04-12 – 2020-04-16 (×4): 6 via TOPICAL

## 2020-04-12 MED ORDER — DOCUSATE SODIUM 100 MG PO CAPS
100.0000 mg | ORAL_CAPSULE | Freq: Two times a day (BID) | ORAL | Status: DC
  Administered 2020-04-12 – 2020-04-17 (×6): 100 mg via ORAL
  Filled 2020-04-12 (×8): qty 1

## 2020-04-12 MED ORDER — SODIUM CHLORIDE 0.9 % IV SOLN
1.0000 g | INTRAVENOUS | Status: DC
  Administered 2020-04-12 – 2020-04-14 (×3): 1 g via INTRAVENOUS
  Filled 2020-04-12: qty 10
  Filled 2020-04-12: qty 1
  Filled 2020-04-12 (×2): qty 10

## 2020-04-12 MED ORDER — OSELTAMIVIR PHOSPHATE 30 MG PO CAPS
30.0000 mg | ORAL_CAPSULE | Freq: Every day | ORAL | Status: DC
  Administered 2020-04-12: 30 mg via ORAL
  Filled 2020-04-12 (×2): qty 1

## 2020-04-12 MED ORDER — ONDANSETRON HCL 4 MG/2ML IJ SOLN
4.0000 mg | Freq: Four times a day (QID) | INTRAMUSCULAR | Status: DC | PRN
  Administered 2020-04-14 (×2): 4 mg via INTRAVENOUS
  Filled 2020-04-12 (×2): qty 2

## 2020-04-12 MED ORDER — ONDANSETRON HCL 4 MG PO TABS: 4.0000 mg | ORAL_TABLET | Freq: Four times a day (QID) | ORAL | Status: DC | PRN

## 2020-04-12 MED ORDER — ACETAMINOPHEN 650 MG RE SUPP
650.0000 mg | Freq: Four times a day (QID) | RECTAL | Status: DC | PRN
  Administered 2020-04-13: 650 mg via RECTAL
  Filled 2020-04-12: qty 1

## 2020-04-12 MED ORDER — SODIUM CHLORIDE 0.9 % IV SOLN
INTRAVENOUS | Status: DC
  Administered 2020-04-12: 100 mL/h via INTRAVENOUS

## 2020-04-12 MED ORDER — DEXTROSE IN LACTATED RINGERS 5 % IV SOLN: INTRAVENOUS | Status: DC

## 2020-04-12 MED ORDER — HEPARIN SODIUM (PORCINE) 5000 UNIT/ML IJ SOLN
5000.0000 [IU] | Freq: Three times a day (TID) | INTRAMUSCULAR | Status: DC
  Administered 2020-04-12 – 2020-04-17 (×16): 5000 [IU] via SUBCUTANEOUS
  Filled 2020-04-12 (×15): qty 1

## 2020-04-12 MED ORDER — ACETAMINOPHEN 325 MG PO TABS
650.0000 mg | ORAL_TABLET | Freq: Four times a day (QID) | ORAL | Status: DC | PRN
  Administered 2020-04-14 – 2020-04-17 (×5): 650 mg via ORAL
  Filled 2020-04-12 (×5): qty 2

## 2020-04-12 NOTE — Code Documentation (Addendum)
Code Stroke called on pt at 2150 for R facial droop, slurred speech, and aphasia, LSN-1200, CBG-126, NIH-15. Pt taken to CT scan where CT head was completed and negative for acute changes. Dr. Thomasena Edis with neurology examined pt in CT. Pt more awake after scan done and no longer aphasic. Code Stroke cancelled at 2217. Dr. Loney Loh notified of situation once in CT and after Code Stroke cancelled.

## 2020-04-12 NOTE — Plan of Care (Signed)
  Problem: Education: Goal: Knowledge of General Education information will improve Description Including pain rating scale, medication(s)/side effects and non-pharmacologic comfort measures Outcome: Progressing   

## 2020-04-12 NOTE — Progress Notes (Signed)
PHARMACY NOTE:  ANTIMICROBIAL RENAL DOSAGE ADJUSTMENT  Current antimicrobial regimen includes a mismatch between antimicrobial dosage and estimated renal function.  As per policy approved by the Pharmacy & Therapeutics and Medical Executive Committees, the antimicrobial dosage will be adjusted accordingly.  Current antimicrobial dosage:  Tamiflu 30mg  PO qday  Indication: Flu  Renal Function:  Estimated Creatinine Clearance: 5 mL/min (A) (by C-G formula based on SCr of 8.41 mg/dL (H)). []      On intermittent HD, scheduled: []      On CRRT    Antimicrobial dosage has been changed to:  Tamiflu 30mg  PO q48  Additional comments: Will only need one more dose tomorrow to complete 5d of therapy   , PharmD, BCIDP, AAHIVP, CPP Infectious Disease Pharmacist 04/12/2020 12:44 PM

## 2020-04-12 NOTE — Plan of Care (Signed)
  Problem: Safety: Goal: Ability to remain free from injury will improve Outcome: Progressing   

## 2020-04-12 NOTE — Progress Notes (Signed)
PROGRESS NOTE    Vicki Swanson  ZOX:[id number] DOB: 1945-09-13 DOA: 04/11/2020 PCP: Oneita HurtPcp, No   Brief Narrative:  PER ADMITTING MD: HPI: Vicki Swanson is a 74 y.o. female with medical history significant of hypertension, COPD, GERD who [facility] with 5 days of pulse.  Diarrhea fever weakness and fatigue.  Patient lives with her husband and has been doing fine until this onset of symptoms.  She has not been vaccinated against COVID-19.  She came to the ER very symptomatic and weak.  She was also altered in her mental status.  Husband has tried to let patient eat and drink at home on that has become difficult.  In the last 3 days she has slowly deteriorated so he brought her to the ER for further evaluation and treatment.  Patient is seen and evaluated.  She is appears to be in acute kidney injury.  Has not been vaccinated against COVID-19 but viral screen shows patient is positive for influenza.  She is being admitted to the hospital with acute influenza infection as well as acute kidney injury.  No previous records in the hospital or in our system but creatinine is above 10.  With spew is diarrhea over the last 5 days patient presumed to have prerenal azotemia.  No obvious acute need for hemodialysis..  ED Course: Temperature 95 with blood pressure 86/54 initially pulse 54 respiratory rate 21 oxygen sat 95% room air.  Current temperature up to 98.3 and blood pressure 118/53.  Acute viral screen is positive for influenza A.  Urinalysis showed cloudy urine with moderate leukocytes nitrite negative.  WBC 21-50 and many bacteria.  Sodium 134 potassium 4.4 chloride 105 CO2 20 glucose 124 BUN 105 creatinine 10.72 calcium 8.7.  Anion gap of 19.  Troponin of 4 lactic acid 1.0.  CBC entirely within normal.  Head CT without contrast is negative.  Chest x-ray shows no active finding.  Patient being admitted with acute influenza A infection.   Assessment & Plan:   Principal Problem:    Influenza Active Problems:   Acute lower UTI   COPD (chronic obstructive pulmonary disease) (HCC)   GERD (gastroesophageal reflux disease)   Essential hypertension   AKI (acute kidney injury) (HCC)   #1 acute influenza A infection: C/W symptomatic management.  Hydrate patient.  Initiate Tamiflu DAY 2/5.  Mainly hydration and pain management.  #2 acute kidney injury: No documented history of chronic kidney disease.  No prior records in our system.  At this point I agree, patient is presumed to have AKI because of 5 days experience diarrhea and possible dehydration. f/up renal ultrasound to rule out obstructive uropathy.  If none exist we will hydrate and consider nephrology consult if no immediate improvement  #3 COPD: No exacerbation  #4 GERD: Continue his PPIs  #5 essential hypertension: Patient was profoundly hypotensive in the ER.  Blood pressure has improved but will cont to hold antihypertensives for now, SBP 110/55  #6 possible UTI: Based on patient's urinalysis she may have had a UTI.  Continue empiric Rocephin.  F/up urine culture and sensitivity.  #7 confusion and altered mental status: Secondary to acute illness including influenza as well as the AKI.  Continue monitoring  #8 history of atrial fibrillation: No obvious A. fib now.  Patient does not appear to be on chronic medications.  Will obtain records in the morning    DVT prophylaxis: Heparin SQ  Code Status: Full    Code Status Orders  (  From admission, onward)         Start     Ordered   04/12/20 0045  Full code  Continuous        04/12/20 0047        Code Status History    This patient has a current code status but no historical code status.   Advance Care Planning Activity     Family Communication: discussed with husband 12/11 Disposition Plan:   Patient remained inpatient.  She is medically stable for discharge will require continued IV hydration, subspecialty consultation and electrolyte  management Consults called: None Admission status: Inpatient   Consultants:   Nephrology  Procedures:  CT HEAD WO CONTRAST  Result Date: 04/11/2020 CLINICAL DATA:  Confusion for a week. Diarrhea last weekend. Weakness. EXAM: CT HEAD WITHOUT CONTRAST TECHNIQUE: Contiguous axial images were obtained from the base of the skull through the vertex without intravenous contrast. COMPARISON:  Head CT, 06/07/2016 FINDINGS: Brain: No evidence of acute infarction, hemorrhage, hydrocephalus, extra-axial collection or mass lesion/mass effect. Small area of lucency in the right basal ganglia consistent with a old lacunar infarct. Minor areas of white matter hypoattenuation are noted bilaterally consistent with chronic microvascular ischemic change. Vascular: No hyperdense vessel or unexpected calcification. Skull: Normal. Negative for fracture or focal lesion. Sinuses/Orbits: Globes and orbits are unremarkable. Visualized sinuses are clear. Other: None. IMPRESSION: 1. No acute intracranial abnormalities. Electronically Signed   By: Amie Portland M.D.   On: 04/11/2020 13:49   DG Chest Port 1 View  Result Date: 04/11/2020 CLINICAL DATA:  Questionable sepsis EXAM: PORTABLE CHEST 1 VIEW COMPARISON:  None. FINDINGS: The lungs are clear and negative for focal airspace consolidation, pulmonary edema or suspicious pulmonary nodule. No pleural effusion or pneumothorax. Cardiac and mediastinal contours are within normal limits. No acute fracture or lytic or blastic osseous lesions. The visualized upper abdominal bowel gas pattern is unremarkable. IMPRESSION: No active disease. Electronically Signed   By: Malachy Moan M.D.   On: 04/11/2020 13:48     Antimicrobials:   See MAR   Subjective: Patient reports feeling mildly better, No acute decompensation overnight  Objective: Vitals:   04/11/20 2100 04/11/20 2224 04/12/20 0224 04/12/20 0449  BP: (!) 121/54 (!) 118/53 (!) 104/53 (!) 110/55  Pulse: 66 64 65  60  Resp: Temp: 98.6 F (37 C) 98.3 F (36.8 C) 98.4 F (36.9 C) 98.2 F (36.8 C)  TempSrc:  Oral  Axillary  SpO2: 100% 98% 100% 100%  Weight:    69.7 kg  Height:        Intake/Output Summary (Last 24 hours) at 04/12/2020 1201 Last data filed at 04/12/2020 0759 Gross per 24 hour  Intake 1210.27 ml  Output 950 ml  Net 260.27 ml   Filed Weights   04/11/20 1239 04/11/20 1259 04/12/20 0449  Weight: 63 kg 65.3 kg 69.7 kg    Examination:  General exam: Appears calm and comfortable resting in bed Respiratory system: Clear to auscultation. Respiratory effort normal. Cardiovascular system: S1 & S2 heard, RRR. No JVD, murmurs, rubs, gallops or clicks. No pedal edema. Gastrointestinal system: Abdomen is nondistended, soft and nontender. No organomegaly or masses felt. Normal bowel sounds heard. Central nervous system: Alert and oriented. No focal neurological deficits. Extremities: Warm well perfused neurovascular intact Skin: Skin tenting consistent with dehydration, no rashes or draining lesions Psychiatry: Judgement and insight appear normal. Mood & affect flat    Data Reviewed: I have personally reviewed  following labs and imaging studies  CBC: Recent Labs  Lab 04/11/20 1250 04/12/20 0456  WBC 9.4 6.2  NEUTROABS 5.5  --   HGB 15.9* 12.4  HCT 45.3 34.4*  MCV 88.5 87.5  PLT 231 118*   Basic Metabolic Panel: Recent Labs  Lab 04/11/20 1250 04/12/20 0456  NA 134* 139  K 4.4 4.0  CL 105 115*  CO2 10* 10*  GLUCOSE 124* 121*  BUN 105* 95*  CREATININE 10.72* 8.41*  CALCIUM 8.7* 8.0*   GFR: Estimated Creatinine Clearance: 5 mL/min (A) (by C-G formula based on SCr of 8.41 mg/dL (H)). Liver Function Tests: Recent Labs  Lab 04/11/20 1250 04/12/20 0456  AST 11* 11*  ALT 11 9  ALKPHOS 98 68  BILITOT 0.7 0.4  PROT 7.9 5.6*  ALBUMIN 4.3 3.0*   No results for input(s): LIPASE, AMYLASE in the last 168 hours. No results for input(s): AMMONIA in the  last 168 hours. Coagulation Profile: Recent Labs  Lab 04/11/20 1250  INR 1.1   Cardiac Enzymes: No results for input(s): CKTOTAL, CKMB, CKMBINDEX, TROPONINI in the last 168 hours. BNP (last 3 results) No results for input(s): PROBNP in the last 8760 hours. HbA1C: No results for input(s): HGBA1C in the last 72 hours. CBG: Recent Labs  Lab 04/11/20 1249  GLUCAP 110*   Lipid Profile: No results for input(s): CHOL, HDL, LDLCALC, TRIG, CHOLHDL, LDLDIRECT in the last 72 hours. Thyroid Function Tests: No results for input(s): TSH, T4TOTAL, FREET4, T3FREE, THYROIDAB in the last 72 hours. Anemia Panel: No results for input(s): VITAMINB12, FOLATE, FERRITIN, TIBC, IRON, RETICCTPCT in the last 72 hours. Sepsis Labs: Recent Labs  Lab 04/11/20 1250  LATICACIDVEN 1.0    Recent Results (from the past 240 hour(s))  Blood culture (routine x 2)     Status: None (Preliminary result)   Collection Time: 04/11/20 12:50 PM   Specimen: BLOOD  Result Value Ref Range Status   Specimen Description   Final    BLOOD RIGHT ANTECUBITAL Performed at [facility], [address] [place], [place] [address]    Special Requests   Final    BOTTLES DRAWN AEROBIC AND ANAEROBIC Blood Culture adequate volume Performed at [facility], [address] [place], [place] [address]    Culture   Final    NO GROWTH < 24 HOURS Performed at [facility], [address], [place], [place] [address]    Report Status PENDING  Incomplete  Blood culture (routine x 2)     Status: None (Preliminary result)   Collection Time: 04/11/20  1:11 PM   Specimen: BLOOD  Result Value Ref Range Status   Specimen Description   Final    BLOOD LEFT ANTECUBITAL Performed at [facility], [address] [place], [place] [address]    Special Requests   Final    BOTTLES DRAWN AEROBIC AND ANAEROBIC Blood Culture adequate volume Performed at [facility], [address] [place], [place] [address]    Culture   Final    NO GROWTH < 24 HOURS Performed at [facility], [address], [place], [place] [address]    Report Status PENDING  Incomplete  Resp Panel by RT-PCR (Flu A&B, Covid) Nasopharyngeal Swab     Status: Abnormal   Collection Time: 04/11/20  1:12 PM   Specimen: Nasopharyngeal Swab; Nasopharyngeal(NP) swabs in vial transport medium  Result Value Ref Range Status  SARS Coronavirus 2 by RT PCR NEGATIVE NEGATIVE Final    Comment: (NOTE) SARS-CoV-2 target nucleic acids are NOT DETECTED.  The SARS-CoV-2 RNA is generally detectable in upper respiratory specimens during the acute phase of infection. The lowest concentration of SARS-CoV-2 viral copies this assay can detect is 138 copies/mL. A negative result does not preclude SARS-Cov-2 infection and should not be used as the sole basis for treatment or other patient management decisions. A negative result may occur with  improper specimen collection/handling, submission of specimen other than nasopharyngeal swab, presence of viral mutation(s) within the areas targeted by this assay, and inadequate number of viral copies(<138 copies/mL). A negative result must be combined with clinical observations, patient history, and epidemiological information. The expected result is Negative.  Fact Sheet for Patients:  BloggerCourse.com  Fact Sheet for Healthcare Providers:  SeriousBroker.it  This test is no t yet approved or cleared by the [place] FDA and  has been authorized for detection and/or diagnosis of SARS-CoV-2 by FDA under an Emergency Use Authorization (EUA). This EUA will remain  in effect (meaning this test can be used) for the duration of the COVID-19 declaration under Section 564(b)(1) of the Act, 21 U.S.C.section 360bbb-3(b)(1), unless the authorization is terminated  or revoked sooner.       Influenza A by PCR POSITIVE (A) NEGATIVE  Final   Influenza B by PCR NEGATIVE NEGATIVE Final    Comment: (NOTE) The Xpert Xpress SARS-CoV-2/FLU/RSV plus assay is intended as an aid in the diagnosis of influenza from Nasopharyngeal swab specimens and should not be used as a sole basis for treatment. Nasal washings and aspirates are unacceptable for Xpert Xpress SARS-CoV-2/FLU/RSV testing.  Fact Sheet for Patients: BloggerCourse.com  Fact Sheet for Healthcare Providers: SeriousBroker.it  This test is not yet approved or cleared by the [place] FDA and has been authorized for detection and/or diagnosis of SARS-CoV-2 by FDA under an Emergency Use Authorization (EUA). This EUA will remain in effect (meaning this test can be used) for the duration of the COVID-19 declaration under Section 564(b)(1) of the Act, 21 U.S.C. section 360bbb-3(b)(1), unless the authorization is terminated or revoked.  Performed at [facility], [address] [place], [place] [address]          Radiology Studies: CT HEAD WO CONTRAST  Result Date: 04/11/2020 CLINICAL DATA:  Confusion for a week. Diarrhea last weekend. Weakness. EXAM: CT HEAD WITHOUT CONTRAST TECHNIQUE: Contiguous axial images were obtained from the base of the skull through the vertex without intravenous contrast. COMPARISON:  Head CT, 06/07/2016 FINDINGS: Brain: No evidence of acute infarction, hemorrhage, hydrocephalus, extra-axial collection or mass lesion/mass effect. Small area of lucency in the right basal ganglia consistent with a old lacunar infarct. Minor areas of white matter hypoattenuation are noted bilaterally consistent with chronic microvascular ischemic change. Vascular: No hyperdense vessel or unexpected calcification. Skull: Normal. Negative for fracture or focal lesion. Sinuses/Orbits: Globes and orbits are unremarkable. Visualized sinuses are clear. Other: None. IMPRESSION: 1. No acute intracranial  abnormalities. Electronically Signed   By: Amie Portland M.D.   On: 04/11/2020 13:49   DG Chest Port 1 View  Result Date: 04/11/2020 CLINICAL DATA:  Questionable sepsis EXAM: PORTABLE CHEST 1 VIEW COMPARISON:  None. FINDINGS: The lungs are clear and negative for focal airspace consolidation, pulmonary edema or suspicious pulmonary nodule. No pleural effusion or pneumothorax. Cardiac and mediastinal contours are within normal limits. No acute fracture or lytic or blastic osseous lesions. The visualized  upper abdominal bowel gas pattern is unremarkable. IMPRESSION: No active disease. Electronically Signed   By: Malachy Moan M.D.   On: 04/11/2020 13:48        Scheduled Meds: . Chlorhexidine Gluconate Cloth  6 each Topical Daily  . docusate sodium  100 mg Oral BID  . guaiFENesin  600 mg Oral BID  . heparin  5,000 Units Subcutaneous Q8H  . oseltamivir  30 mg Oral QHS   Continuous Infusions: . sodium chloride 20 mL/hr at 04/11/20 1315  . cefTRIAXone (ROCEPHIN)  IV 1 g (04/12/20 0147)  . dextrose 5 % and 0.9% NaCl 125 mL/hr at 04/12/20 1109     LOS: 1 day    Time spent: 35 min    Burke Keels, MD [facility]  If 7PM-7AM, please contact night-coverage  04/12/2020, 12:01 PM

## 2020-04-12 NOTE — Progress Notes (Signed)
Floor coverage overnight event  Patient admitted for acute influenza A infection, AKI, and possible UTI.  Notified by nursing staff at 9:58 PM that the patient was having new onset neurologic changes including confusion, lethargy, right-sided weakness, right facial droop, and slurred speech. Symptom onset around 9:30 PM. Rapid response was called and code stroke activated.  The patient has been taken down to the radiology department for a stat head CT and neurology was called.  Not hypoglycemic-CBG 126.  Last set of vitals checked at 9:12 PM: Temperature 98.4 F, pulse 87, respiratory rate 23, blood pressure 162/76, and SPO2 97% on room air.  Notified by rapid response RN that the patient was evaluated by neurology and code stroke was canceled after her head CT was reviewed and felt to be negative for acute stroke and her symptoms were instead felt to be due to somnolence.  Patient was seen and examined at bedside. Awake and alert. Confused and oriented to self only. Speech dysarthric and right-sided facial droop present. Noted to have right upper and lower extremity weakness. However, examination limited as patient was following commands only intermittently.  -Stat head CT negative for acute intracranial abnormality. Stat brain MRI has been ordered. -Stat ABG -Check ammonia, TSH, and B12 levels -Continue treatment for acute influenza A infection, AKI, and possible UTI. -Frequent neurochecks

## 2020-04-12 NOTE — Significant Event (Addendum)
Rapid Response Event Note   Reason for Call :  Facial droop, garbled speech   Initial Focused Assessment:  Pt laying in bed with eyes closed. Pt will awaken to verbal command and say her name-speech is dysarthric. Pt will not answer any other questions and has a hard time following some commands-pt will follow some simple commands. R facial droop present.  CBG-126, T-98.4, HR-87, BP-162/76, RR-20, SpO2-97% on RA, NIH-15.  Interventions:  CBG-126 Code Stroke initiated-see Code Stroke note for more details Plan of Care:  Pt more awake and able to speak and follow commands better once in CT. CT-negative for acute changes. Code Stroke cancelled. Dr. Loney Loh to see pt once back from CT scan-await her orders. Continue to monitor pt. Call RRT if further assistance needed.    Event Summary:   MD Notified: Dr. Loney Loh  Call [phone] Arrival [phone] End Time:2220  Terrilyn Saver, RN

## 2020-04-12 NOTE — Consult Note (Signed)
Nephrology Consult  [facility]  Requesting provider: Marzetta Board*  Assessment/Recommendations:   AKI (improving): Likely secondary to pre-renal injury in the setting of decreased PO intake and diarrhea. Was hypotensive in the ER which was fluid responsive. Hyaline casts present on UA, Hgb hemoconcentrated. Baseline Cr 1-1.1. Peak Cr 11 here, now 8.4 -fortunately, kidney function improved with hydration -no indication for renal replacement therapy at this junction especially as kidney function is improving, will attempt supportive care first before considering this -supportive care with fluids -renal ultrasound pending -continue with foley -Continue to monitor daily Cr, Dose meds for GFR<15 -Monitor Daily I/Os, Daily weight  -Maintain MAP>65 for optimal renal perfusion.  -Avoid further nephrotoxins including NSAIDS, Morphine.  Unless absolutely necessary, avoid CT with contrast and/or MRI with gadolinium.     Encephalopathy: -secondary to infection and AKI, hopefully as kidney function improves then mental status will improve.  Anion gap metabolic acidosis: secondary to AKI -please check blood gas, switch to bicarb based fluids if pH 7.1 or less -changing fluids to D5LR @ 125cc/hr  Influenza A: mgmt per primary service. On droplet precautions, tamiflu x 5 days  Possible UTI: on empiric rocephin  Hypotension: fluid resuscitated   History of hypertension: hold anti-HTN's  Recommendations conveyed to primary service.    Anthony Sar [facility] 04/12/2020 8:48 AM   _____________________________________________________________________________________   History of Present Illness: Vicki Swanson is a 74 y.o. female with a past medical history of HTN, COPD, GERD, h/o afib who presents to [facility] (transfer from [facility]) with diarrhea, fever, weakness, fatigue, AMS. Has been having low PO intake and diarrhea.   Found to have influenza A positive  and was in AKI. Received 1L of NS followed by 2L of LR after my discussion with [facility] ER. CXR clear. Data obtained from husband from bedside.  He reports that over the last week or so she has not eaten anything.  He does report that her current mentation is very off as compared to her baseline. She does utilize depends due to urinary incontinence so he is not sure how her urine output has been.  But he does say that she has been still urinating. ROS unobtainable given patient's current mentation.  Medications:  Current Facility-Administered Medications  Medication Dose Route Frequency Provider Last Rate Last Admin  . 0.9 %  sodium chloride infusion   Intravenous PRN Cheryll Cockayne, MD 20 mL/hr at 04/11/20 1315 New Bag at 04/11/20 1315  . acetaminophen (TYLENOL) tablet 650 mg  650 mg Oral Q6H PRN Rometta Emery, MD       Or  . acetaminophen (TYLENOL) suppository 650 mg  650 mg Rectal Q6H PRN Mikeal Hawthorne, Mohammad L, MD      . albuterol (PROVENTIL) (2.5 MG/3ML) 0.083% nebulizer solution 2.5 mg  2.5 mg Nebulization Q2H PRN Earlie Lou L, MD      . cefTRIAXone (ROCEPHIN) 1 g in sodium chloride 0.9 % 100 mL IVPB  1 g Intravenous Q24H Earlie Lou L, MD 200 mL/hr at 04/12/20 0147 1 g at 04/12/20 0147  . Chlorhexidine Gluconate Cloth 2 % PADS 6 each  6 each Topical Daily Garba, Mohammad L, MD      . dextrose 5 %-0.9 % sodium chloride infusion   Intravenous Continuous Rometta Emery, MD 125 mL/hr at 04/12/20 0146 New Bag at 04/12/20 0146  . docusate sodium (COLACE) capsule 100 mg  100 mg Oral BID Rometta Emery, MD   100 mg at  04/12/20 0148  . guaiFENesin (MUCINEX) 12 hr tablet 600 mg  600 mg Oral BID Earlie Lou L, MD   600 mg at 04/12/20 0148  . heparin injection 5,000 Units  5,000 Units Subcutaneous Q8H Rometta Emery, MD   5,000 Units at 04/12/20 0148  . ondansetron (ZOFRAN) tablet 4 mg  4 mg Oral Q6H PRN Rometta Emery, MD       Or  . ondansetron (ZOFRAN) injection 4 mg  4 mg  Intravenous Q6H PRN Earlie Lou L, MD      . oseltamivir (TAMIFLU) capsule 30 mg  30 mg Oral QHS Earlie Lou L, MD   30 mg at 04/12/20 0148     ALLERGIES Patient has no known allergies.  MEDICAL HISTORY Past Medical History:  Diagnosis Date  . COPD (chronic obstructive pulmonary disease) (HCC)   . GERD (gastroesophageal reflux disease)   . Hypertension      SOCIAL HISTORY Social History   Socioeconomic History  . Marital status: Married    Spouse name: Not on file  . Number of children: Not on file  . Years of education: Not on file  . Highest education level: Not on file  Occupational History  . Not on file  Tobacco Use  . Smoking status: Never Smoker  . Smokeless tobacco: Never Used  Substance and Sexual Activity  . Alcohol use: Never  . Drug use: Never  . Sexual activity: Not on file  Other Topics Concern  . Not on file  Social History Narrative  . Not on file   Social Determinants of Health   Financial Resource Strain: Not on file  Food Insecurity: Not on file  Transportation Needs: Not on file  Physical Activity: Not on file  Stress: Not on file  Social Connections: Not on file  Intimate Partner Violence: Not on file     FAMILY HISTORY No family history on file.    Review of Systems: Unobtainable in the setting of altered mental status  Physical Exam: Vitals:   04/12/20 0224 04/12/20 0449  BP: (!) 104/53 (!) 110/55  Pulse: 65 60  Resp: 18 18  Temp: 98.4 F (36.9 C) 98.2 F (36.8 C)  SpO2: 100% 100%   Total I/O In: -  Out: 650 [Urine:650]  Intake/Output Summary (Last 24 hours) at 04/12/2020 0848 Last data filed at 04/12/2020 0759 Gross per 24 hour  Intake 1210.27 ml  Output 950 ml  Net 260.27 ml   General: no acute distress HEENT: anicteric sclera, oropharynx clear without lesions, dry mucosal membranes CV: regular rate, normal rhythm, no murmurs, no gallops, no rubs Lungs: clear to auscultation bilaterally, normal work of  breathing Abd: soft, non-tender, non-distended Skin: no visible lesions or rashes Psych: alert, engaged, appropriate mood and affect Musculoskeletal: no edema, no obvious deformities Neuro: Speech not clear, can only tell me her first name, not following all commands.  Moves all extremities spontaneously  Test Results Reviewed Lab Results  Component Value Date   NA 139 04/12/2020   K 4.0 04/12/2020   CL 115 (H) 04/12/2020   CO2 10 (L) 04/12/2020   BUN 95 (H) 04/12/2020   CREATININE 8.41 (H) 04/12/2020   CALCIUM 8.0 (L) 04/12/2020   ALBUMIN 3.0 (L) 04/12/2020     I have reviewed all relevant outside healthcare records related to the patient's kidney injury.

## 2020-04-12 NOTE — Progress Notes (Signed)
New Admission Note: ? Arrival Method:Stretcher  Mental Orientation: Alert and Oriented X4 Telemetry: Box 5 Assessment: Completed Skin: Refer to flowsheet IV: Right AC and Right Wrist  Pain: 0/10 Tubes: Foley catheter  Safety Measures: Safety Fall Prevention Plan discussed with patient. Admission: Completed [facility] [place] Orientation: Patient has been orientated to the room, unit and the staff. Family: None at the Bedside  Orders have been reviewed and are being implemented. Will continue to monitor the patient. Call light has been placed within reach and bed alarm has been activated.  ? Donia Guiles, RN  Phone Number: [phone]

## 2020-04-13 ENCOUNTER — Other Ambulatory Visit (HOSPITAL_COMMUNITY): Payer: Medicare Other

## 2020-04-13 ENCOUNTER — Encounter (HOSPITAL_COMMUNITY): Payer: Medicare Other

## 2020-04-13 ENCOUNTER — Inpatient Hospital Stay (HOSPITAL_COMMUNITY): Payer: Medicare Other

## 2020-04-13 DIAGNOSIS — G459 Transient cerebral ischemic attack, unspecified: Secondary | ICD-10-CM

## 2020-04-13 DIAGNOSIS — G9340 Encephalopathy, unspecified: Secondary | ICD-10-CM

## 2020-04-13 LAB — BASIC METABOLIC PANEL
Anion gap: 11 (ref 5–15)
BUN: 75 mg/dL — ABNORMAL HIGH (ref 8–23)
CO2: 13 mmol/L — ABNORMAL LOW (ref 22–32)
Calcium: 8.1 mg/dL — ABNORMAL LOW (ref 8.9–10.3)
Chloride: 120 mmol/L — ABNORMAL HIGH (ref 98–111)
Creatinine, Ser: 4.32 mg/dL — ABNORMAL HIGH (ref 0.44–1.00)
GFR, Estimated: 10 mL/min — ABNORMAL LOW (ref >60)
Glucose, Bld: 121 mg/dL — ABNORMAL HIGH (ref 70–99)
Potassium: 3.7 mmol/L (ref 3.5–5.1)
Sodium: 144 mmol/L (ref 135–145)

## 2020-04-13 LAB — BLOOD GAS, ARTERIAL
Acid-base deficit: 14.3 mmol/L — ABNORMAL HIGH (ref 0.0–2.0)
Bicarbonate: 11.2 mmol/L — ABNORMAL LOW (ref 20.0–28.0)
FIO2: 21
O2 Saturation: 96.9 %
Patient temperature: 36.9
pCO2 arterial: 24.5 mmHg — ABNORMAL LOW (ref 32.0–48.0)
pH, Arterial: 7.281 — ABNORMAL LOW (ref 7.350–7.450)
pO2, Arterial: 92.7 mmHg (ref 83.0–108.0)

## 2020-04-13 LAB — LIPID PANEL
Cholesterol: 143 mg/dL (ref 0–200)
HDL: 24 mg/dL — ABNORMAL LOW (ref >40)
LDL Cholesterol: 87 mg/dL (ref 0–99)
Total CHOL/HDL Ratio: 6 RATIO
Triglycerides: 159 mg/dL — ABNORMAL HIGH (ref <150)
VLDL: 32 mg/dL (ref 0–40)

## 2020-04-13 LAB — TSH: TSH: 0.601 u[IU]/mL (ref 0.350–4.500)

## 2020-04-13 LAB — URINE CULTURE: Culture: NO GROWTH

## 2020-04-13 LAB — HEMOGLOBIN A1C
Hgb A1c MFr Bld: 4.9 % (ref 4.8–5.6)
Mean Plasma Glucose: 93.93 mg/dL

## 2020-04-13 LAB — VITAMIN B12: Vitamin B-12: 547 pg/mL (ref 180–914)

## 2020-04-13 LAB — AMMONIA: Ammonia: 23 umol/L (ref 9–35)

## 2020-04-13 IMAGING — MR MR MRA HEAD W/O CM
2 series · 41 of 48 positions shown · non-contrast
Comparison: Brain MRI [Y6] hours today and earlier.

CLINICAL DATA: 74-year-old female code stroke presentation
yesterday. No acute finding on MRI.

EXAM:
MRA HEAD WITHOUT CONTRAST
TECHNIQUE: Angiographic images of the Circle of Willis were obtained using MRA
technique without intravenous contrast.

[Series 1: aahead_scout · sagittal · 1.6mm · 1.62mm/px · 20 of 128 slices shown]
[im 1/128]
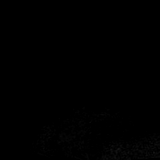
[im 7/128]
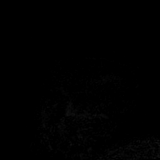
[im 14/128]
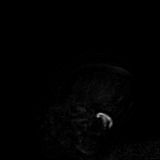
[im 21/128]
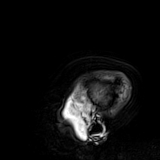
[im 27/128]
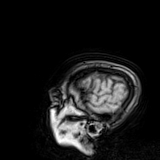
[im 34/128]
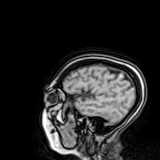
[im 41/128]
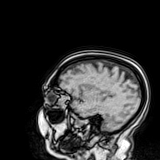
[im 47/128]
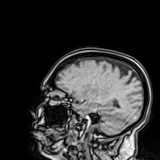
[im 54/128]
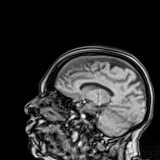
[im 61/128]
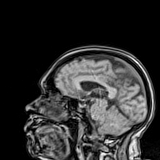
[im 67/128]
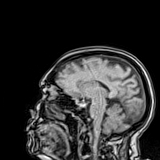
[im 74/128]
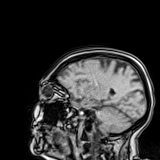
[im 81/128]
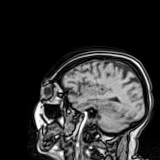
[im 87/128]
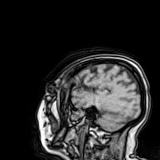
[im 94/128]
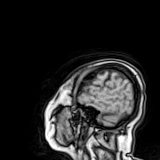
[im 101/128]
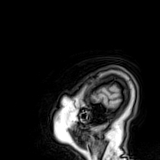
[im 107/128]
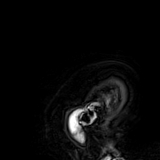
[im 114/128]
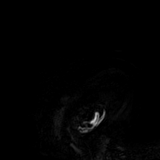
[im 121/128]
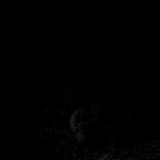
[im 128/128]
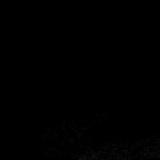

[Series 9: 3d cow · axial · 0.5mm · 0.41mm/px · z∈[-72,+6]mm · 21 of 172 slices shown]
[im 1/172]
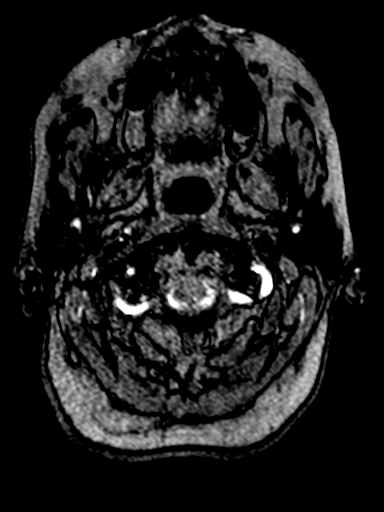
[im 7/172]
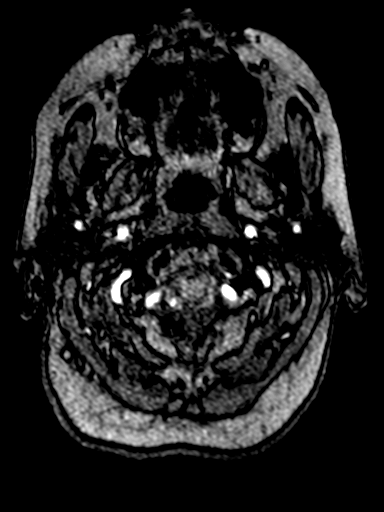
[im 13/172]
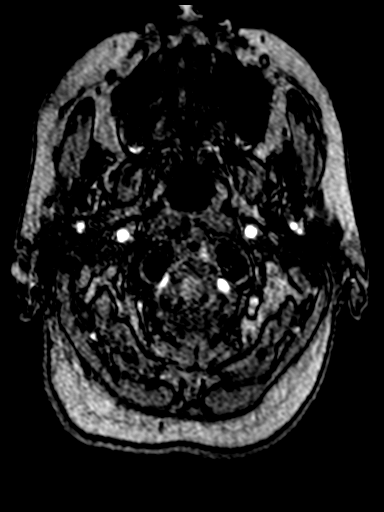
[im 20/172]
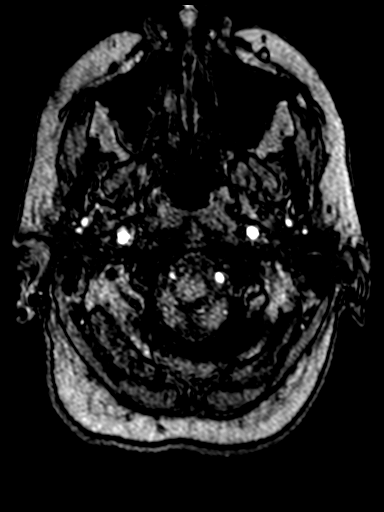
[im 26/172]
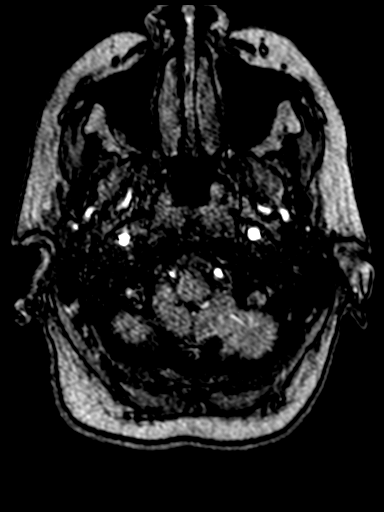
[im 32/172]
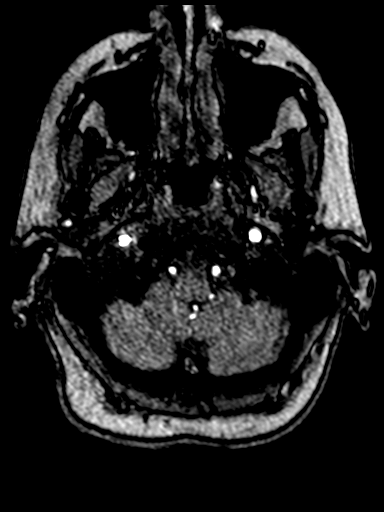
[im 39/172]
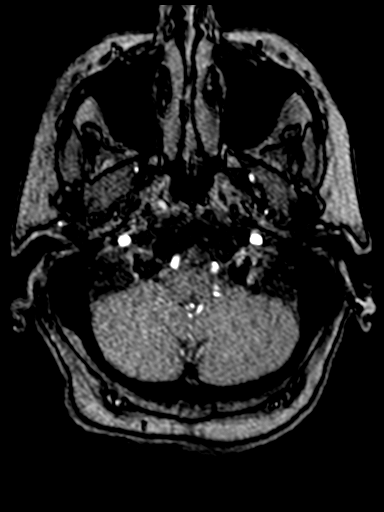
[im 45/172]
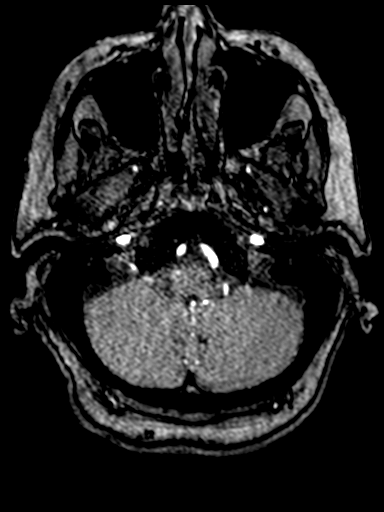
[im 51/172]
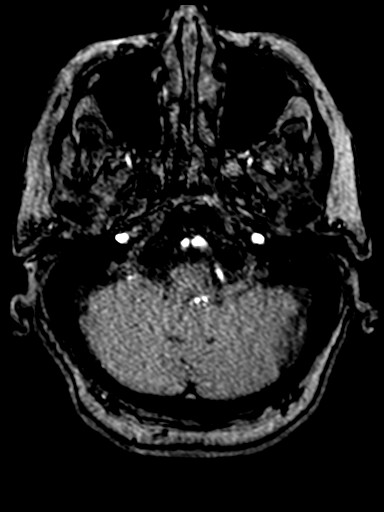
[im 58/172]
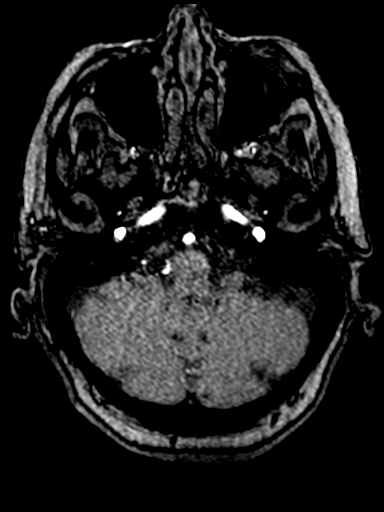
[im 64/172]
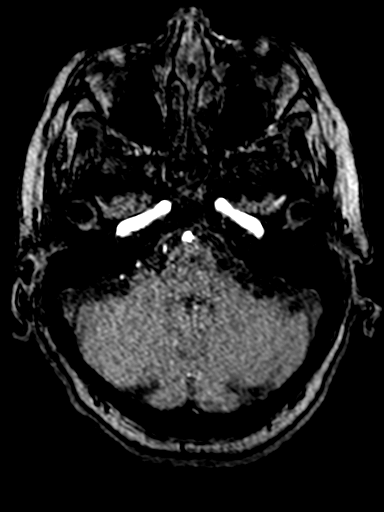
[im 70/172]
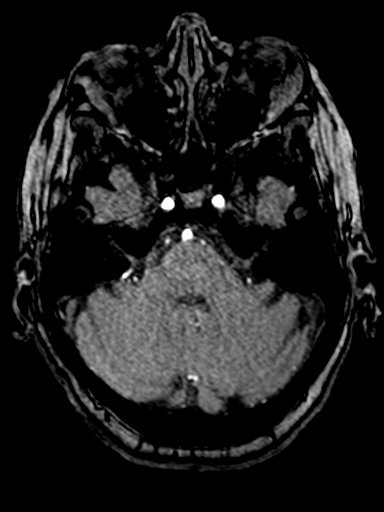
[im 77/172]
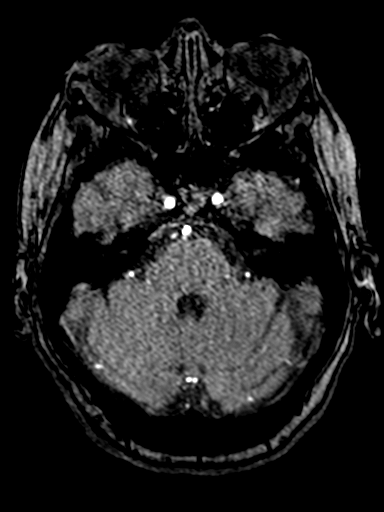
[im 83/172]
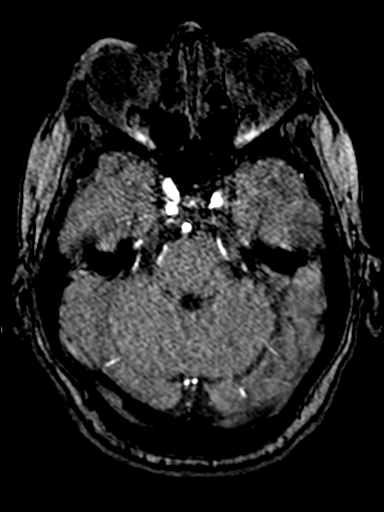
[im 89/172]
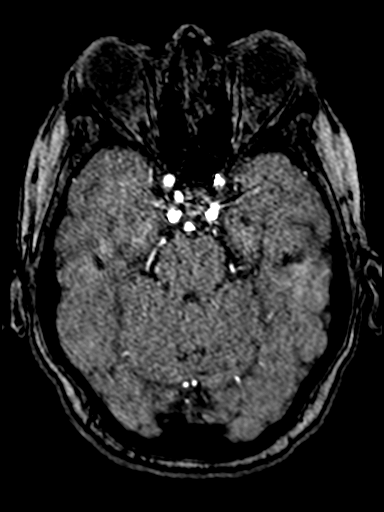
[im 96/172]
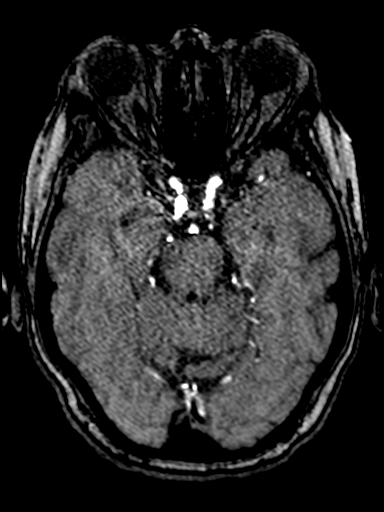
[im 102/172]
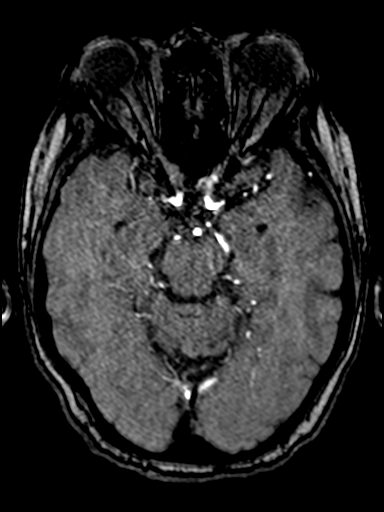
[im 121/172]
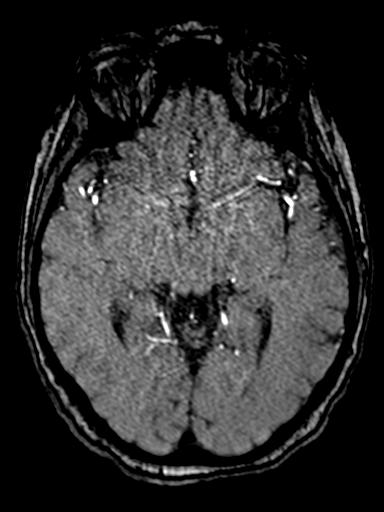
[im 140/172]
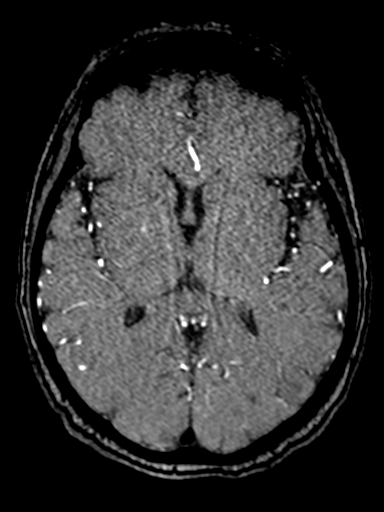
[im 146/172]
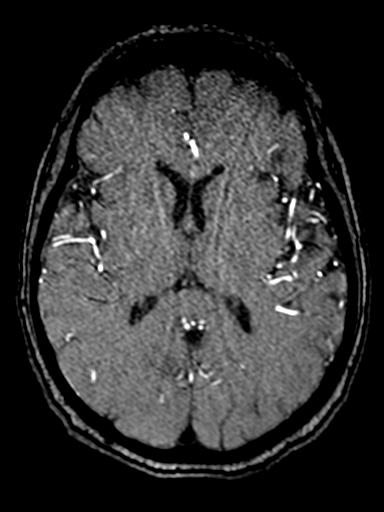
[im 165/172]
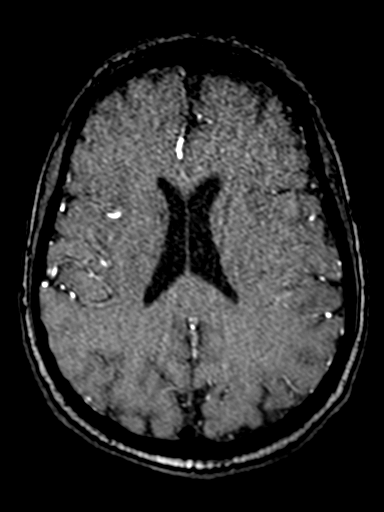

[41 of 48 positions shown; findings below may reference images not displayed]

FINDINGS: No intracranial mass effect or ventriculomegaly.

Antegrade flow in the posterior circulation. Dominant appearing
distal left vertebral artery and moderate to severe stenosis of the
right vertebral V4 segment (series [Y6], image 9). The right AICA
appears to be dominant and is patent. Mild irregularity and stenosis
of the left V4 segment just upstream of the normal left PICA origin.
Patent vertebrobasilar junction. Mild proximal basilar artery
stenosis.

More moderate irregularity and stenosis of the basilar tip, but the
SCA and PCA origins remain patent. Superimposed moderate to severe
left and severe short segment right PCA P1 stenoses. Bilateral PCA
branches remain patent although are mildly irregular, more so the
left.

Antegrade flow in both ICA siphons with artifacts suspected on the
lowest image. Both siphons are patent although irregular and there
is evidence of severe left siphon stenosis in the cavernous segment
(flow gap which also corresponds to an area of flow void
irregularity on the earlier MRI). On the right side sign some only
mild siphon stenosis is suspected.

Both carotid termini remain patent. MCA and ACA origins are within
normal limits. Visible ACA branches are within normal limits. MCA M1
segments and MCA bi/trifurcations are within normal limits. Visible
bilateral MCA branches are mildly irregular.
IMPRESSION: 1. Evidence of severe intracranial atherosclerosis, most notably:
- Severe stenosis Left ICA cavernous segment.
- up to Moderate stenosis at the Basilar tip, with moderate to
Severe bilateral PCA P1 stenoses.
- Moderate to severe Right Vertebral V4 segment stenosis.

2. Additional mild stenoses including distal Left vertebral artery,
proximal Basilar artery, Right siphon.

3. Neurointerventional Radiology consultation would be valuable.

## 2020-04-13 MED ORDER — ASPIRIN 325 MG PO TABS
325.0000 mg | ORAL_TABLET | Freq: Every day | ORAL | Status: DC
Start: 2020-04-13 — End: 2020-04-17
  Administered 2020-04-13 – 2020-04-17 (×5): 325 mg via ORAL
  Filled 2020-04-13 (×5): qty 1

## 2020-04-13 MED ORDER — STERILE WATER FOR INJECTION IV SOLN
INTRAVENOUS | Status: DC
  Filled 2020-04-13 (×4): qty 850

## 2020-04-13 MED ORDER — SODIUM BICARBONATE 4.2 % IV SOLN
50.0000 meq | Freq: Once | INTRAVENOUS | Status: DC
  Filled 2020-04-13: qty 100

## 2020-04-13 MED ORDER — HYDRALAZINE HCL 20 MG/ML IJ SOLN
5.0000 mg | INTRAMUSCULAR | Status: DC | PRN
  Administered 2020-04-13 – 2020-04-16 (×5): 5 mg via INTRAVENOUS
  Filled 2020-04-13 (×5): qty 1

## 2020-04-13 MED ORDER — CARBAMAZEPINE ER 100 MG PO TB12
100.0000 mg | ORAL_TABLET | Freq: Two times a day (BID) | ORAL | Status: DC
  Administered 2020-04-13 – 2020-04-14 (×3): 100 mg via ORAL
  Filled 2020-04-13 (×4): qty 1

## 2020-04-13 MED ORDER — GABAPENTIN 100 MG PO CAPS
100.0000 mg | ORAL_CAPSULE | Freq: Three times a day (TID) | ORAL | Status: DC
  Administered 2020-04-13 – 2020-04-17 (×12): 100 mg via ORAL
  Filled 2020-04-13 (×12): qty 1

## 2020-04-13 MED ORDER — ATORVASTATIN CALCIUM 10 MG PO TABS
10.0000 mg | ORAL_TABLET | Freq: Every day | ORAL | Status: DC
  Administered 2020-04-13: 17:00:00 10 mg via ORAL
  Filled 2020-04-13: qty 1

## 2020-04-13 NOTE — Progress Notes (Signed)
[facility] Progress Note    Assessment/ Plan:   AKI (improving): Likely secondary to pre-renal injury in the setting of decreased PO intake and diarrhea. Was hypotensive in the ER which was fluid responsive. Hyaline casts present on UA, Hgb hemoconcentrated. Baseline Cr 1-1.1. Peak Cr 11 here, now 4.3 -fortunately, kidney function improved with hydration -no indication for renal replacement therapy at this junction especially as kidney function is improving -supportive care with fluids, getting contrast today. Would not stop fluids at all right now -renal ultrasound: does have mild right hydronephrosis with possible 0.3 cm echogenic focus in the right kidney which could be a small stone. Given the fact that her kidney function is improving, we will hold off on any further interventions for now.  If kidney function moves in the wrong direction, then would recommend obtaining a CT renal stone protocol (+/- GU consultation) -continue with foley -Continue to monitor daily Cr, Dose meds for GFR<15 -Monitor Daily I/Os, Daily weight  -Maintain MAP>65 for optimal renal perfusion.  -Avoid further nephrotoxins including NSAIDS, Morphine.  Unless absolutely necessary, avoid CT with contrast and/or MRI with gadolinium.     Encephalopathy: -s/p code stroke, MRI unremarkable. MRA today -secondary to infection and AKI? Possible intracran pathology I.e. CVA  Anion gap metabolic acidosis: secondary to AKI. Improving -fluids switched to bicarb based fluids for pH of 7.28. Would recommend switch back to D5LR otherwise check ABG in AM  Influenza A: mgmt per primary service. On droplet precautions, tamiflu x 5 days  Possible UTI: on empiric rocephin  Hypotension: fluid resuscitated, resolved  History of hypertension: held anti-HTN's. BP plan dictated by MRA findings (does she need permissive HTN?)   Subjective:   Rep response overnight concerning for TIA versus stroke.  Mentation  slightly better today.  Open for MRA.  Spoke with husband at bedside   Objective:   BP (!) 176/69 (BP Location: Left Arm)   Pulse 65   Temp 98 F (36.7 C) (Oral)   Resp 18   Ht 5' (1.524 m)   Wt 69.7 kg   SpO2 100%   BMI 30.01 kg/m   Intake/Output Summary (Last 24 hours) at 04/13/2020 1403 Last data filed at 04/12/2020 2112 Gross per 24 hour  Intake 0 ml  Output 425 ml  Net -425 ml   Weight change: 6.652 kg  Physical Exam: Gen: NAD CVS: RRR Resp: Normal work of breathing Abd: Nondistended Ext: No edema Neuro: Slight dysarthria (improved), awake, alert, moves all extremities spontaneously (but globally weak)  Imaging: DG Abd 1 View  Result Date: 04/13/2020 CLINICAL DATA:  Metal implant.  MRI. EXAM: ABDOMEN - 1 VIEW COMPARISON:  None. FINDINGS: There are metallic clips in the right upper quadrant likely related to prior cholecystectomy. These are not a contraindication to MRI. 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 The bowel gas pattern is nonobstructive. IMPRESSION: Metallic clips in the right upper quadrant likely related to prior cholecystectomy. These are not a contraindication to MRI. Nonobstructive bowel gas pattern. Electronically Signed   By: Katherine Mantle M.D.   On: 04/13/2020 00:03   MR ANGIO HEAD WO CONTRAST  Result Date: 04/13/2020 CLINICAL DATA:  74 year old female code stroke presentation yesterday. No acute finding on MRI. EXAM: MRA HEAD WITHOUT CONTRAST TECHNIQUE: Angiographic images of the Circle of Willis were obtained using MRA technique without intravenous contrast. COMPARISON:  Brain MRI 0004 hours today and earlier. FINDINGS:  No intracranial mass effect or ventriculomegaly. Antegrade flow in the posterior circulation. Dominant appearing distal left vertebral artery and moderate to severe stenosis of the right vertebral V4 segment (series 1055, image 9). The right  AICA appears to be dominant and is patent. Mild irregularity and stenosis of the left V4 segment just upstream of the normal left PICA origin. Patent vertebrobasilar junction. Mild proximal basilar artery stenosis. More moderate irregularity and stenosis of the basilar tip, but the SCA and PCA origins remain patent. Superimposed moderate to severe left and severe short segment right PCA P1 stenoses. Bilateral PCA branches remain patent although are mildly irregular, more so the left. Antegrade flow in both ICA siphons with artifacts suspected on the lowest image. Both siphons are patent although irregular and there is evidence of severe left siphon stenosis in the cavernous segment (flow gap which also corresponds to an area of flow void irregularity on the earlier MRI). On the right side sign some only mild siphon stenosis is suspected. Both carotid termini remain patent. MCA and ACA origins are within normal limits. Visible ACA branches are within normal limits. MCA M1 segments and MCA bi/trifurcations are within normal limits. Visible bilateral MCA branches are mildly irregular. IMPRESSION: 1. Evidence of severe intracranial atherosclerosis, most notably: - Severe stenosis Left ICA cavernous segment. - up to Moderate stenosis at the Basilar tip, with moderate to Severe bilateral PCA P1 stenoses. - Moderate to severe Right Vertebral V4 segment stenosis. 2. Additional mild stenoses including distal Left vertebral artery, proximal Basilar artery, Right siphon. 3. Neurointerventional Radiology consultation would be valuable. Electronically Signed   By: Odessa Fleming M.D.   On: 04/13/2020 13:48   MR BRAIN WO CONTRAST  Result Date: 04/13/2020 CLINICAL DATA:  Initial evaluation for neuro deficit, stroke suspected. EXAM: MRI HEAD WITHOUT CONTRAST TECHNIQUE: Multiplanar, multiecho pulse sequences of the brain and surrounding structures were obtained without intravenous contrast. COMPARISON:  Prior CT from 04/12/2020.  FINDINGS: Brain: Diffuse prominence of the CSF containing spaces compatible with generalized age-related cerebral atrophy. Mild scattered patchy T2/FLAIR hyperintensity within the periventricular and deep white matter both cerebral hemispheres as well as the pons, most likely related chronic microvascular ischemic disease, mild for age. 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 No abnormal foci of restricted diffusion to suggest acute or subacute ischemia. Gray-white matter differentiation maintained. No encephalomalacia to suggest chronic cortical infarction. No foci of susceptibility artifact to suggest acute or chronic intracranial hemorrhage. No mass lesion, midline shift or mass effect. No hydrocephalus or extra-axial fluid collection. Pituitary gland suprasellar region normal. Midline structures intact. Vascular: Major intracranial vascular flow voids are maintained. Skull and upper cervical spine: Craniocervical junction within normal limits. Bone marrow signal intensity normal. No scalp soft tissue abnormality. Sinuses/Orbits: Patient status post bilateral ocular lens replacement. Globes and orbital soft tissues demonstrate no acute finding. Mild scattered mucosal thickening noted within the ethmoidal air cells, sphenoid sinuses, and maxillary sinuses. Air-fluid level within the left sphenoid sinus suggestive of acute sinusitis. No significant mastoid effusion. Inner ear structures normal. Other: None. IMPRESSION: 1. No acute intracranial abnormality. 2. Age-related cerebral atrophy with mild chronic small vessel ischemic disease. 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 3. Air-fluid level within the left sphenoid sinus suggestive of acute sinusitis. Electronically Signed   By: Rise Mu M.D.   On: 04/13/2020 00:30   US RENAL  Result Date: 04/12/2020 CLINICAL DATA:  Acute renal failure EXAM: RENAL / URINARY TRACT ULTRASOUND  COMPLETE COMPARISON:  None. FINDINGS: Right Kidney: Renal measurements: 9.1 x 4.6 x 4.7 cm = volume: 102 mL. Echogenicity appears within normal limits. There is mild right hydronephrosis. There is a 0.3 cm echogenic focus which is nonspecific but possibly represents a small stone. No renal mass. Left Kidney: Renal measurements: 8.8 x 4.7 x 4.2 cm = volume: 90 mL. Echogenicity appears within normal limits. No hydronephrosis. No renal calculus. No renal mass identified. Bladder: Decompressed with Foley catheter in place. Other: None. IMPRESSION: 1. Mild right kidney hydronephrosis. There is a 0.3 cm echogenic focus in the right kidney which could represent a small stone. 2.  Unremarkable appearance of the left kidney. Electronically Signed   By: Emmaline Kluver M.D.   On: 04/12/2020 11:04   CT HEAD CODE STROKE WO CONTRAST  Result Date: 04/12/2020 CLINICAL DATA:  Code stroke. Initial evaluation for acute facial droop, aphasia. Of EXAM: CT HEAD WITHOUT CONTRAST TECHNIQUE: Contiguous axial images were obtained from the base of the skull through the vertex without intravenous contrast. COMPARISON:  None. FINDINGS: Brain: Cerebral volume within normal limits for age. Mild chronic microvascular ischemic disease noted within the periventricular white matter. 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 No acute intracranial hemorrhage. No acute large vessel territory infarct. No mass lesion, midline shift or mass effect. No hydrocephalus or extra-axial fluid collection. Vascular: No hyperdense vessel. Mild calcified atherosclerosis at the skull base. Skull: Scalp soft tissues and calvarium within normal limits. Sinuses/Orbits: Globes and orbital soft tissues are normal. Mild scattered mucosal thickening noted within the paranasal sinuses, with small volume opacity within the left sphenoid sinus. Mastoid air cells and middle ear cavities are clear. Other: None. ASPECTS [facility] Stroke Program  Early CT Score) - Ganglionic level infarction (caudate, lentiform nuclei, internal capsule, insula, M1-M3 cortex): 7 - Supraganglionic infarction (M4-M6 cortex): 3 Total score (0-10 with 10 being normal): 10 IMPRESSION: 1. No acute intracranial abnormality. 2. ASPECTS is 10. 3. 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 These results were communicated to Dr. Thomasena Edis at 10:30 pmon 12/11/2021by text page via the [facility] messaging system. Electronically Signed   By: Rise Mu M.D.   On: 04/12/2020 22:32    Labs: BMET Recent Labs  Lab 04/11/20 1250 04/12/20 0456 04/13/20 0411  NA 134* 139 144  K 4.4 4.0 3.7  CL 105 115* 120*  CO2 10* 10* 13*  GLUCOSE 124* 121* 121*  BUN 105* 95* 75*  CREATININE 10.72* 8.41* 4.32*  CALCIUM 8.7* 8.0* 8.1*   CBC Recent Labs  Lab 04/11/20 1250 04/12/20 0456  WBC 9.4 6.2  NEUTROABS 5.5  --   HGB 15.9* 12.4  HCT 45.3 34.4*  MCV 88.5 87.5  PLT 231 118*    Medications:    . aspirin  325 mg Oral Daily  . atorvastatin  10 mg Oral Daily  . carbamazepine  100 mg Oral BID  . Chlorhexidine Gluconate Cloth  6 each Topical Daily  . docusate sodium  100 mg Oral BID  . gabapentin  100 mg Oral TID  . guaiFENesin  600 mg Oral BID  . heparin  5,000 Units Subcutaneous Q8H  . oseltamivir  30 mg Oral Q48H      Anthony Sar, MD [facility] 04/13/2020, 2:03 PM

## 2020-04-13 NOTE — Consult Note (Addendum)
Brief consult note  Patient was noted to have face droop, slurred speech, and aphasia ~ ~2150   LN:1200  CBG-126, NIH-15.    Ref. Range 04/13/2020 04:11  Chloride Latest Ref Range: 98 - 111 mmol/L 120 (H)  CO2 Latest Ref Range: 22 - 32 mmol/L 13 (L)  Glucose Latest Ref Range: 70 - 99 mg/dL 633 (H)  BUN Latest Ref Range: 8 - 23 mg/dL 75 (H)  Creatinine Latest Ref Range: 0.44 - 1.00 mg/dL 3.54 (H)  Calcium Latest Ref Range: 8.9 - 10.3 mg/dL 8.1 (L)    Pt taken to CT which was reviewed and did not show acute ischemic changes. ASPECTS 10.  Exam showed patient to awake, alert, following commands consistently without focal neurologic deficits NIHSS0 and code Stroke cancelled at 2217.  Continue to correct infectious etiology and metabolic derangements. Consider MRI brain or CT head non-contrast in 24 hours if MRI not possible, and if positive for acute ischemic stroke or if suspicion remains high, please call neurology for further evaluation.

## 2020-04-13 NOTE — Evaluation (Signed)
Physical Therapy Evaluation Patient Details Name: Vicki Swanson MRN: [id number] DOB: 1945/05/17 Today's Date: 04/13/2020   History of Present Illness  74 y.o. female with medical history significant of hypertension, COPD, GERD who presented to [facility] with c/o diarrhea, fever, weakness, and fatigue. Pt admitted with acute influenza infection as well as acute kidney injury. Code stroke called 12/11. MRI negative. Code stroke canceled due to symptoms resolved.    Clinical Impression  Pt admitted with above diagnosis. PTA pt lived at home with her husband, independent mobility/ADLs. Husband reports pt does have h/o confusion but current presentation is worse than baseline. On eval, she required +2 max assist bed mobility and +2 mod/HHA sit to stand. Only able to tolerate static stand each trial < 30 seconds. Unable to progress gait. Pt currently with functional limitations due to the deficits listed below. Pt will benefit from skilled PT to increase their independence and safety with mobility to allow discharge to the venue listed below.       Follow Up Recommendations SNF;Supervision/Assistance - 24 hour    Equipment Recommendations  Other (comment) (TBD)    Recommendations for Other Services       Precautions / Restrictions Precautions Precautions: Fall      Mobility  Bed Mobility Overal bed mobility: Needs Assistance Bed Mobility: Supine to Sit;Sit to Supine     Supine to sit: +2 for physical assistance;Max assist;HOB elevated Sit to supine: +2 for physical assistance;Max assist   General bed mobility comments: assist with all aspects of mobility, use of bed pad to scoot to EOB. Improved initiation of movement with sit>sup as compared with sup>sit.    Transfers Overall transfer level: Needs assistance Equipment used: 2 person hand held assist Transfers: Sit to/from Stand Sit to Stand: +2 physical assistance;Mod assist         General transfer comment: sit  <> stand x 2 trials from EOB  Ambulation/Gait             General Gait Details: unable  Stairs            Wheelchair Mobility    Modified Rankin (Stroke Patients Only)       Balance Overall balance assessment: Needs assistance Sitting-balance support: Feet supported;Single extremity supported Sitting balance-Leahy Scale: Fair Sitting balance - Comments: min guard assist sitting EOB   Standing balance support: Bilateral upper extremity supported;During functional activity Standing balance-Leahy Scale: Poor Standing balance comment: reliant on external support                             Pertinent Vitals/Pain Pain Assessment: Faces Faces Pain Scale: Hurts little more Pain Location: neck musculature Pain Descriptors / Indicators: Grimacing;Guarding;Discomfort;Tightness;Tender Pain Intervention(s): Monitored during session;Limited activity within patient's tolerance;Repositioned    Home Living Family/patient expects to be discharged to:: Private residence Living Arrangements: Spouse/significant other Available Help at Discharge: Family;Available 24 hours/day Type of Home: House Home Access: Stairs to enter   [employer] of Steps: multi steps, split level house Home Layout: Two level Home Equipment: None      Prior Function Level of Independence: Independent         Comments: Pt is a poor historian. Above information confirmed with husband.     Hand Dominance        Extremity/Trunk Assessment   Upper Extremity Assessment Upper Extremity Assessment: Defer to OT evaluation    Lower Extremity Assessment Lower Extremity Assessment: Generalized weakness  Cervical / Trunk Assessment Cervical / Trunk Assessment: Other exceptions Cervical / Trunk Exceptions: tightness of cervical musculature resulting in R rotation and L sidebend. Painful palpation and ROM.  Communication   Communication: No difficulties  Cognition  Arousal/Alertness: Awake/alert Behavior During Therapy: Flat affect Overall Cognitive Status: Impaired/Different from baseline Area of Impairment: Orientation;Attention;Memory;Following commands;Safety/judgement;Awareness;Problem solving                 Orientation Level: Disoriented to;Place;Time;Situation Current Attention Level: Focused Memory: Decreased short-term memory Following Commands: Follows one step commands inconsistently;Follows one step commands with increased time Safety/Judgement: Decreased awareness of safety;Decreased awareness of deficits Awareness: Intellectual Problem Solving: Slow processing;Decreased initiation;Difficulty sequencing;Requires verbal cues;Requires tactile cues General Comments: Husband reports confusion at baseline but current presentation is a decline from baseline. Very poor attention span, only focusing on a task for a few seconds. Perseverating on words. Pt confused to time; therefore, told that the month is December. 'December' was then her answer when asked the year. Pt told she was at [facility], and then asked what city she was from. Her answer was [place]      General Comments General comments (skin integrity, edema, etc.): VSS on RA    Exercises     Assessment/Plan    PT Assessment Patient needs continued PT services  PT Problem List Decreased strength;Decreased mobility;Decreased safety awareness;Decreased coordination;Decreased activity tolerance;Decreased cognition;Decreased balance;Decreased knowledge of use of DME;Pain       PT Treatment Interventions DME instruction;Therapeutic activities;Cognitive remediation;Gait training;Therapeutic exercise;Patient/family education;Balance training;Functional mobility training    PT Goals (Current goals can be found in the Care Plan section)  Acute Rehab PT Goals Patient Stated Goal: not stated PT Goal Formulation: Patient unable to participate in goal setting Time For Goal  Achievement: 04/27/20 Potential to Achieve Goals: Good    Frequency Min 3X/week   Barriers to discharge        Co-evaluation PT/OT/SLP Co-Evaluation/Treatment: Yes Reason for Co-Treatment: Complexity of the patient's impairments (multi-system involvement);For patient/therapist safety;Necessary to address cognition/behavior during functional activity PT goals addressed during session: Mobility/safety with mobility;Balance         AM-PAC PT "6 Clicks" Mobility  Outcome Measure Help needed turning from your back to your side while in a flat bed without using bedrails?: A Lot Help needed moving from lying on your back to sitting on the side of a flat bed without using bedrails?: A Lot Help needed moving to and from a bed to a chair (including a wheelchair)?: A Lot Help needed standing up from a chair using your arms (e.g., wheelchair or bedside chair)?: A Lot Help needed to walk in hospital room?: Total Help needed climbing 3-5 steps with a railing? : Total 6 Click Score: 10    End of Session Equipment Utilized During Treatment: Gait belt Activity Tolerance: Patient tolerated treatment well Patient left: in bed;with call bell/phone within reach;with bed alarm set Nurse Communication: Mobility status PT Visit Diagnosis: Other abnormalities of gait and mobility (R26.89);Muscle weakness (generalized) (M62.81);Pain;Unsteadiness on feet (R26.81)    Time: 0922-0950 PT Time Calculation (min) (ACUTE ONLY): 28 min   Charges:   PT Evaluation $PT Eval Moderate Complexity: 1 Mod          Aida Raider, PT  Office # [phone] Pager [phone]   Ilda Foil 04/13/2020, 12:06 PM

## 2020-04-13 NOTE — Plan of Care (Signed)
  Problem: Education: Goal: Knowledge of General Education information will improve Description: Including pain rating scale, medication(s)/side effects and non-pharmacologic comfort measures Outcome: Not Progressing Note: Conversation with husband and he stated that garbled speech for 1 week. Exit care notes left on Stroke prevention and warning signs of Stroke will inform day shift RN to teach Husband when he visits   Problem: Health Behavior/Discharge Planning: Goal: Ability to manage health-related needs will improve Outcome: Progressing   Problem: Clinical Measurements: Goal: Ability to maintain clinical measurements within normal limits will improve Outcome: Progressing Goal: Will remain free from infection Outcome: Progressing Goal: Diagnostic test results will improve Outcome: Progressing Goal: Respiratory complications will improve Outcome: Progressing Goal: Cardiovascular complication will be avoided Outcome: Progressing   Problem: Activity: Goal: Risk for activity intolerance will decrease Outcome: Not Progressing   Problem: Nutrition: Goal: Adequate nutrition will be maintained Outcome: Not Progressing   Problem: Coping: Goal: Level of anxiety will decrease Outcome: Progressing   Problem: Elimination: Goal: Will not experience complications related to bowel motility Outcome: Progressing Goal: Will not experience complications related to urinary retention Outcome: Not Progressing Note: Foley    Problem: Pain Managment: Goal: General experience of comfort will improve Outcome: Progressing   Problem: Safety: Goal: Ability to remain free from injury will improve Outcome: Progressing   Problem: Skin Integrity: Goal: Risk for impaired skin integrity will decrease Outcome: Progressing

## 2020-04-13 NOTE — Progress Notes (Signed)
Spoke with MD regarding 2nd ABG order. Time was apparently put in incorrect in system by lab making it look like ABG drawn previous day. 1st ABG was drawn at 0050 on 12/12. 2nd is canceled.

## 2020-04-13 NOTE — Progress Notes (Signed)
[facility] notified ot MEWS yellow for level of consciences informed to systolic bp 187 new orders received and patient bp stabilized and charted in Epic. Ilean Skill LPN

## 2020-04-13 NOTE — Progress Notes (Signed)
PROGRESS NOTE    Vicki Swanson  ZOX:[id number] DOB: 01-20-1946 DOA: 04/11/2020 PCP: Pcp, No   Brief Narrative:  PER ADMITTING MD: WJX:BJYN Andrewsis a 74 y.o.femalewith medical history significant ofhypertension, COPD, GERD who presented to [facility] with 5 days of pulse. Diarrhea fever weakness and fatigue. Patient lives with her husband and has been doing fine until this onset of symptoms. She has not been vaccinated against COVID-19. She came to the ER very symptomatic and weak. She was also altered in her mental status. Husband has tried to let patient eat and drink at home on that has become difficult. In the last 3 days she has slowly deteriorated so he brought her to the ER for further evaluation and treatment. Patient is seen and evaluated. She is appears to be in acute kidney injury. Has not been vaccinated against COVID-19 but viral screen shows patient is positive for influenza. She is being admitted to the hospital with acute influenza infection as well as acute kidney injury. No previous records in the hospital or in our system but creatinine is above 10. With spew is diarrhea over the last 5 days patient presumed to have prerenal azotemia. No obvious acute need for hemodialysis..  ED Course:Temperature 95 with blood pressure 86/54 initially pulse 54 respiratory rate 21 oxygen sat 95% room air. Current temperature up to 98.3 and blood pressure 118/53. Acute viral screen is positive for influenza A. Urinalysis showed cloudy urine with moderate leukocytes nitrite negative. WBC 21-50 and many bacteria. Sodium 134 potassium 4.4 chloride 105 CO2 20 glucose 124 BUN 105 creatinine 10.72 calcium 8.7. Anion gap of 19. Troponin of 4 lactic acid 1.0. CBC entirely within normal. Head CT without contrast is negative. Chest x-ray shows no active finding. Patient being admitted with acute influenza A infection.   Assessment & Plan:   Principal Problem:    Influenza Active Problems:   Acute lower UTI   COPD (chronic obstructive pulmonary disease) (HCC)   GERD (gastroesophageal reflux disease)   Essential hypertension   AKI (acute kidney injury) (HCC)   acute influenza A infection: C/W symptomatic management. Hydrate patient. Initiate Tamiflu DAY 3/5. Mainly hydration and pain management.  ? TIA Pt with RR with findings concerning for possible TIA versus stroke Acute head CT negative MRI pending Patient was not placed on aspirin which will I place now Add a statin Discussed with neurology for follow-up today  acute kidney injury:No documented history of chronic kidney disease. No prior records in our system. At this point I agree, patient is presumed to have AKI because of 5 days experience diarrhea and possible dehydration. f/up renal ultrasound to rule out obstructive uropathy. If none exist we will hydrate and consider nephrology consult if no immediate improvement  COPD:No exacerbation  GERD:Continue his PPIs  essential hypertension:Patient was profoundly hypotensive in the ER. Blood pressure has improved but will cont to hold antihypertensives for now in the setting of patient's potential neurological event  possible WGN:FAOZH on patient's urinalysis she may have had a UTI. Continue empiric Rocephin. F/up urine culture and sensitivity.  confusion and altered mental status:Secondary to acute illness including influenza as well as the AKI. Continue monitoring  history of atrial fibrillation:No obvious A. fib now. Patient does not appear to be on chronic medications. Will obtain records in the morning   DVT prophylaxis: Heparin SQ  Code Status: Full    Code Status Orders  (From admission, onward)         Start  Ordered   04/12/20 0045  Full code  Continuous        04/12/20 0047        Code Status History    This patient has a current code status but no historical code status.   Advance  Care Planning Activity     Family Communication: Tried calling husband, no answer, voicemail not set up Disposition Plan:   Patient remained in the hospital additional day for further neurological work-up.  She is not yet medically stable for discharge. Consults called: Neurology Admission status: Inpatient   Consultants:   As above  Procedures:  DG Abd 1 View  Result Date: 04/13/2020 CLINICAL DATA:  Metal implant.  MRI. EXAM: ABDOMEN - 1 VIEW COMPARISON:  None. FINDINGS: There are metallic clips in the right upper quadrant likely related to prior cholecystectomy. These are not a contraindication to MRI. 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 The bowel gas pattern is nonobstructive. IMPRESSION: Metallic clips in the right upper quadrant likely related to prior cholecystectomy. These are not a contraindication to MRI. Nonobstructive bowel gas pattern. Electronically Signed   By: Katherine Mantle M.D.   On: 04/13/2020 00:03   CT HEAD WO CONTRAST  Result Date: 04/11/2020 CLINICAL DATA:  Confusion for a week. Diarrhea last weekend. Weakness. EXAM: CT HEAD WITHOUT CONTRAST TECHNIQUE: Contiguous axial images were obtained from the base of the skull through the vertex without intravenous contrast. COMPARISON:  Head CT, 06/07/2016 FINDINGS: Brain: No evidence of acute infarction, hemorrhage, hydrocephalus, extra-axial collection or mass lesion/mass effect. Small area of lucency in the right basal ganglia consistent with a old lacunar infarct. Minor areas of white matter hypoattenuation are noted bilaterally consistent with chronic microvascular ischemic change. Vascular: No hyperdense vessel or unexpected calcification. Skull: Normal. Negative for fracture or focal lesion. Sinuses/Orbits: Globes and orbits are unremarkable. Visualized sinuses are clear. Other: None. IMPRESSION: 1. No acute intracranial  abnormalities. Electronically Signed   By: Amie Portland M.D.   On: 04/11/2020 13:49   MR BRAIN WO CONTRAST  Result Date: 04/13/2020 CLINICAL DATA:  Initial evaluation for neuro deficit, stroke suspected. EXAM: MRI HEAD WITHOUT CONTRAST TECHNIQUE: Multiplanar, multiecho pulse sequences of the brain and surrounding structures were obtained without intravenous contrast. COMPARISON:  Prior CT from 04/12/2020. FINDINGS: Brain: Diffuse prominence of the CSF containing spaces compatible with generalized age-related cerebral atrophy. Mild scattered patchy T2/FLAIR hyperintensity within the periventricular and deep white matter both cerebral hemispheres as well as the pons, most likely related chronic microvascular ischemic disease, mild for age. 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 No abnormal foci of restricted diffusion to suggest acute or subacute ischemia. Gray-white matter differentiation maintained. No encephalomalacia to suggest chronic cortical infarction. No foci of susceptibility artifact to suggest acute or chronic intracranial hemorrhage. No mass lesion, midline shift or mass effect. No hydrocephalus or extra-axial fluid collection. Pituitary gland suprasellar region normal. Midline structures intact. Vascular: Major intracranial vascular flow voids are maintained. Skull and upper cervical spine: Craniocervical junction within normal limits. Bone marrow signal intensity normal. No scalp soft tissue abnormality. Sinuses/Orbits: Patient status post bilateral ocular lens replacement. Globes and orbital soft tissues demonstrate no acute finding. Mild scattered mucosal thickening noted within the ethmoidal air cells, sphenoid sinuses, and maxillary sinuses. Air-fluid level within the left sphenoid sinus suggestive of acute sinusitis. No significant mastoid effusion. Inner ear structures normal. Other: None. IMPRESSION: 1. No acute  intracranial abnormality. 2. Age-related  cerebral atrophy with mild chronic small vessel ischemic disease. 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 3. Air-fluid level within the left sphenoid sinus suggestive of acute sinusitis. Electronically Signed   By: Rise Mu M.D.   On: 04/13/2020 00:30   US RENAL  Result Date: 04/12/2020 CLINICAL DATA:  Acute renal failure EXAM: RENAL / URINARY TRACT ULTRASOUND COMPLETE COMPARISON:  None. FINDINGS: Right Kidney: Renal measurements: 9.1 x 4.6 x 4.7 cm = volume: 102 mL. Echogenicity appears within normal limits. There is mild right hydronephrosis. There is a 0.3 cm echogenic focus which is nonspecific but possibly represents a small stone. No renal mass. Left Kidney: Renal measurements: 8.8 x 4.7 x 4.2 cm = volume: 90 mL. Echogenicity appears within normal limits. No hydronephrosis. No renal calculus. No renal mass identified. Bladder: Decompressed with Foley catheter in place. Other: None. IMPRESSION: 1. Mild right kidney hydronephrosis. There is a 0.3 cm echogenic focus in the right kidney which could represent a small stone. 2.  Unremarkable appearance of the left kidney. Electronically Signed   By: Emmaline Kluver M.D.   On: 04/12/2020 11:04   DG Chest Port 1 View  Result Date: 04/11/2020 CLINICAL DATA:  Questionable sepsis EXAM: PORTABLE CHEST 1 VIEW COMPARISON:  None. FINDINGS: The lungs are clear and negative for focal airspace consolidation, pulmonary edema or suspicious pulmonary nodule. No pleural effusion or pneumothorax. Cardiac and mediastinal contours are within normal limits. No acute fracture or lytic or blastic osseous lesions. The visualized upper abdominal bowel gas pattern is unremarkable. IMPRESSION: No active disease. Electronically Signed   By: Malachy Moan M.D.   On: 04/11/2020 13:48   CT HEAD CODE STROKE WO CONTRAST  Result Date: 04/12/2020 CLINICAL DATA:  Code stroke. Initial evaluation for acute facial droop, aphasia. Of  EXAM: CT HEAD WITHOUT CONTRAST TECHNIQUE: Contiguous axial images were obtained from the base of the skull through the vertex without intravenous contrast. COMPARISON:  None. FINDINGS: Brain: Cerebral volume within normal limits for age. Mild chronic microvascular ischemic disease noted within the periventricular white matter. 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 No acute intracranial hemorrhage. No acute large vessel territory infarct. No mass lesion, midline shift or mass effect. No hydrocephalus or extra-axial fluid collection. Vascular: No hyperdense vessel. Mild calcified atherosclerosis at the skull base. Skull: Scalp soft tissues and calvarium within normal limits. Sinuses/Orbits: Globes and orbital soft tissues are normal. Mild scattered mucosal thickening noted within the paranasal sinuses, with small volume opacity within the left sphenoid sinus. Mastoid air cells and middle ear cavities are clear. Other: None. ASPECTS [facility] Stroke Program Early CT Score) - Ganglionic level infarction (caudate, lentiform nuclei, internal capsule, insula, M1-M3 cortex): 7 - Supraganglionic infarction (M4-M6 cortex): 3 Total score (0-10 with 10 being normal): 10 IMPRESSION: 1. No acute intracranial abnormality. 2. ASPECTS is 10. 3. 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 These results were communicated to Dr. Thomasena Edis at 10:30 pmon 12/11/2021by text page via the [facility] messaging system. Electronically Signed   By: Rise Mu M.D.   On: 04/12/2020 22:32     Antimicrobials:   See MAR   Subjective: Patient with rapid response overnight concerning for possible TIA versus stroke Symptoms appear to have resolved this morning  Objective: Vitals:   04/13/20 0043 04/13/20 0200 04/13/20 0448 04/13/20 0700  BP: (!) 187/82 (!) 159/75 (!) 148/59 (!) 152/57  Pulse: 97 98 68 64  Resp:   18 18  Temp: 98.8 F  (37.1 C) 97.9 F (36.6 C) 97.7 F (36.5 C) (!) 97.5 F (36.4 C)  TempSrc: Oral Oral Oral Oral  SpO2: 98% 98% 98% 99%  Weight:      Height:        Intake/Output Summary (Last 24 hours) at 04/13/2020 1205 Last data filed at 04/12/2020 2112 Gross per 24 hour  Intake 461.78 ml  Output 1025 ml  Net -563.22 ml   Filed Weights   04/11/20 1259 04/12/20 0449 04/12/20 2112  Weight: 65.3 kg 69.7 kg 69.7 kg    Examination:  General exam: Appears calm and comfortable  Respiratory system: Clear to auscultation. Respiratory effort normal. Cardiovascular system: S1 & S2 heard, RRR. No JVD, murmurs, rubs, gallops or clicks. No pedal edema. Gastrointestinal system: Abdomen is nondistended, soft and nontender. No organomegaly or masses felt. Normal bowel sounds heard. Central nervous system: Alert and oriented. No focal neurological deficits, symptoms from previous evening appear resolved Extremities: Warm well perfused, globally weak. Skin: No rashes, lesions or ulcers Psychiatry: Judgement and insight appear normal for age and condition mood & affect appropriate.     Data Reviewed: I have personally reviewed following labs and imaging studies  CBC: Recent Labs  Lab 04/11/20 1250 04/12/20 0456  WBC 9.4 6.2  NEUTROABS 5.5  --   HGB 15.9* 12.4  HCT 45.3 34.4*  MCV 88.5 87.5  PLT 231 118*   Basic Metabolic Panel: Recent Labs  Lab 04/11/20 1250 04/12/20 0456 04/13/20 0411  NA 134* 139 144  K 4.4 4.0 3.7  CL 105 115* 120*  CO2 10* 10* 13*  GLUCOSE 124* 121* 121*  BUN 105* 95* 75*  CREATININE 10.72* 8.41* 4.32*  CALCIUM 8.7* 8.0* 8.1*   GFR: Estimated Creatinine Clearance: 9.8 mL/min (A) (by C-G formula based on SCr of 4.32 mg/dL (H)). Liver Function Tests: Recent Labs  Lab 04/11/20 1250 04/12/20 0456  AST 11* 11*  ALT 11 9  ALKPHOS 98 68  BILITOT 0.7 0.4  PROT 7.9 5.6*  ALBUMIN 4.3 3.0*   No results for input(s): LIPASE, AMYLASE in the last 168 hours. Recent  Labs  Lab 04/12/20 2307  AMMONIA 23   Coagulation Profile: Recent Labs  Lab 04/11/20 1250  INR 1.1   Cardiac Enzymes: No results for input(s): CKTOTAL, CKMB, CKMBINDEX, TROPONINI in the last 168 hours. BNP (last 3 results) No results for input(s): PROBNP in the last 8760 hours. HbA1C: No results for input(s): HGBA1C in the last 72 hours. CBG: Recent Labs  Lab 04/11/20 1249 04/12/20 2143  GLUCAP 110* 126*   Lipid Profile: Recent Labs    04/13/20 1023  CHOL 143  HDL 24*  LDLCALC 87  TRIG 440*  CHOLHDL 6.0   Thyroid Function Tests: Recent Labs    04/12/20 2307  TSH 0.601   Anemia Panel: Recent Labs    04/12/20 2307  VITAMINB12 547   Sepsis Labs: Recent Labs  Lab 04/11/20 1250  LATICACIDVEN 1.0    Recent Results (from the past 240 hour(s))  Blood culture (routine x 2)     Status: None (Preliminary result)   Collection Time: 04/11/20 12:50 PM   Specimen: BLOOD  Result Value Ref Range Status   Specimen Description   Final    BLOOD RIGHT ANTECUBITAL Performed at [facility], [address], [place], [place] [address]    Special Requests   Final  BOTTLES DRAWN AEROBIC AND ANAEROBIC Blood Culture adequate volume Performed at [facility], [address] [place], [place] [address]    Culture   Final    NO GROWTH 2 DAYS Performed at [facility], [address] [place], [place] [address]    Report Status PENDING  Incomplete  Blood culture (routine x 2)     Status: None (Preliminary result)   Collection Time: 04/11/20  1:11 PM   Specimen: BLOOD  Result Value Ref Range Status   Specimen Description   Final    BLOOD LEFT ANTECUBITAL Performed at [facility], [address] [place], [place] [address]    Special Requests   Final    BOTTLES DRAWN AEROBIC AND ANAEROBIC Blood Culture adequate volume Performed at [facility], [address] [place], [place] [address]    Culture   Final    NO  GROWTH 2 DAYS Performed at [facility], [address] [place], [place] [address]    Report Status PENDING  Incomplete  Resp Panel by RT-PCR (Flu A&B, Covid) Nasopharyngeal Swab     Status: Abnormal   Collection Time: 04/11/20  1:12 PM   Specimen: Nasopharyngeal Swab; Nasopharyngeal(NP) swabs in vial transport medium  Result Value Ref Range Status   SARS Coronavirus 2 by RT PCR NEGATIVE NEGATIVE Final    Comment: (NOTE) SARS-CoV-2 target nucleic acids are NOT DETECTED.  The SARS-CoV-2 RNA is generally detectable in upper respiratory specimens during the acute phase of infection. The lowest concentration of SARS-CoV-2 viral copies this assay can detect is 138 copies/mL. A negative result does not preclude SARS-Cov-2 infection and should not be used as the sole basis for treatment or other patient management decisions. A negative result may occur with  improper specimen collection/handling, submission of specimen other than nasopharyngeal swab, presence of viral mutation(s) within the areas targeted by this assay, and inadequate number of viral copies(<138 copies/mL). A negative result must be combined with clinical observations, patient history, and epidemiological information. The expected result is Negative.  Fact Sheet for Patients:  BloggerCourse.com  Fact Sheet for Healthcare Providers:  SeriousBroker.it  This test is no t yet approved or cleared by the [place] FDA and  has been authorized for detection and/or diagnosis of SARS-CoV-2 by FDA under an Emergency Use Authorization (EUA). This EUA will remain  in effect (meaning this test can be used) for the duration of the COVID-19 declaration under Section 564(b)(1) of the Act, 21 U.S.C.section 360bbb-3(b)(1), unless the authorization is terminated  or revoked sooner.       Influenza A by PCR POSITIVE (A) NEGATIVE Final   Influenza B by PCR NEGATIVE NEGATIVE  Final    Comment: (NOTE) The Xpert Xpress SARS-CoV-2/FLU/RSV plus assay is intended as an aid in the diagnosis of influenza from Nasopharyngeal swab specimens and should not be used as a sole basis for treatment. Nasal washings and aspirates are unacceptable for Xpert Xpress SARS-CoV-2/FLU/RSV testing.  Fact Sheet for Patients: BloggerCourse.com  Fact Sheet for Healthcare Providers: SeriousBroker.it  This test is not yet approved or cleared by the [place] FDA and has been authorized for detection and/or diagnosis of SARS-CoV-2 by FDA under an Emergency Use Authorization (EUA). This EUA will remain in effect (meaning this test can be used) for the duration of the COVID-19 declaration under Section 564(b)(1) of the Act, 21 U.S.C. section 360bbb-3(b)(1), unless the authorization is terminated or revoked.  Performed at [facility], [address] [place], [place] [address]   Urine culture     Status: None (Preliminary result)   Collection Time: 04/11/20  5:00 PM   Specimen: In/Out Cath Urine  Result Value Ref Range Status   Specimen Description   Final    IN/OUT CATH URINE Performed at [facility], [address] [place], [place] [address]    Special Requests   Final    NONE Performed at [facility], [address] [place], [place] [address]    Culture   Final    CULTURE REINCUBATED FOR BETTER GROWTH Performed at [facility], [address], [place], [place] [address]    Report Status PENDING  Incomplete  Culture, Urine     Status: None   Collection Time: 04/12/20 10:21 AM   Specimen: Urine, Random  Result Value Ref Range Status   Specimen Description URINE, RANDOM  Final   Special Requests NONE  Final   Culture   Final    NO GROWTH Performed at [facility], [address] [place], [place] [address]    Report Status 04/13/2020 FINAL  Final          Radiology Studies: DG Abd 1 View  Result Date: 04/13/2020 CLINICAL DATA:  Metal implant.  MRI. EXAM: ABDOMEN - 1 VIEW COMPARISON:  None. FINDINGS: There are metallic clips in the right upper quadrant likely related to prior cholecystectomy. These are not a contraindication to MRI. 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 The bowel gas pattern is nonobstructive. IMPRESSION: Metallic clips in the right upper quadrant likely related to prior cholecystectomy. These are not a contraindication to MRI. Nonobstructive bowel gas pattern. Electronically Signed   By: Katherine Mantle M.D.   On: 04/13/2020 00:03   CT HEAD WO CONTRAST  Result Date: 04/11/2020 CLINICAL DATA:  Confusion for a week. Diarrhea last weekend. Weakness. EXAM: CT HEAD WITHOUT CONTRAST TECHNIQUE: Contiguous axial images were obtained from the base of the skull through the vertex without intravenous contrast. COMPARISON:  Head CT, 06/07/2016 FINDINGS: Brain: No evidence of acute infarction, hemorrhage, hydrocephalus, extra-axial collection or mass lesion/mass effect. Small area of lucency in the right basal ganglia consistent with a old lacunar infarct. Minor areas of white matter hypoattenuation are noted bilaterally consistent with chronic microvascular ischemic change. Vascular: No hyperdense vessel or unexpected calcification. Skull: Normal. Negative for fracture or focal lesion. Sinuses/Orbits: Globes and orbits are unremarkable. Visualized sinuses are clear. Other: None. IMPRESSION: 1. No acute intracranial abnormalities. Electronically Signed   By: Amie Portland M.D.   On: 04/11/2020 13:49   MR BRAIN WO CONTRAST  Result Date: 04/13/2020 CLINICAL DATA:  Initial evaluation for neuro deficit, stroke suspected. EXAM: MRI HEAD WITHOUT CONTRAST TECHNIQUE: Multiplanar, multiecho pulse sequences of the brain and surrounding structures were  obtained without intravenous contrast. COMPARISON:  Prior CT from 04/12/2020. FINDINGS: Brain: Diffuse prominence of the CSF containing spaces compatible with generalized age-related cerebral atrophy. Mild scattered patchy T2/FLAIR hyperintensity within the periventricular and deep white matter both cerebral hemispheres as well as the pons, most likely related chronic microvascular ischemic disease, mild for age. 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 No abnormal foci of restricted diffusion to suggest acute or subacute ischemia. Gray-white matter differentiation maintained. No encephalomalacia to suggest chronic cortical infarction. No foci of susceptibility  artifact to suggest acute or chronic intracranial hemorrhage. No mass lesion, midline shift or mass effect. No hydrocephalus or extra-axial fluid collection. Pituitary gland suprasellar region normal. Midline structures intact. Vascular: Major intracranial vascular flow voids are maintained. Skull and upper cervical spine: Craniocervical junction within normal limits. Bone marrow signal intensity normal. No scalp soft tissue abnormality. Sinuses/Orbits: Patient status post bilateral ocular lens replacement. Globes and orbital soft tissues demonstrate no acute finding. Mild scattered mucosal thickening noted within the ethmoidal air cells, sphenoid sinuses, and maxillary sinuses. Air-fluid level within the left sphenoid sinus suggestive of acute sinusitis. No significant mastoid effusion. Inner ear structures normal. Other: None. IMPRESSION: 1. No acute intracranial abnormality. 2. Age-related cerebral atrophy with mild chronic small vessel ischemic disease. 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 3. Air-fluid level within the left sphenoid sinus suggestive of acute sinusitis. Electronically Signed   By: Rise MuBenjamin  McClintock M.D.   On: 04/13/2020 00:30   US RENAL  Result Date:  04/12/2020 CLINICAL DATA:  Acute renal failure EXAM: RENAL / URINARY TRACT ULTRASOUND COMPLETE COMPARISON:  None. FINDINGS: Right Kidney: Renal measurements: 9.1 x 4.6 x 4.7 cm = volume: 102 mL. Echogenicity appears within normal limits. There is mild right hydronephrosis. There is a 0.3 cm echogenic focus which is nonspecific but possibly represents a small stone. No renal mass. Left Kidney: Renal measurements: 8.8 x 4.7 x 4.2 cm = volume: 90 mL. Echogenicity appears within normal limits. No hydronephrosis. No renal calculus. No renal mass identified. Bladder: Decompressed with Foley catheter in place. Other: None. IMPRESSION: 1. Mild right kidney hydronephrosis. There is a 0.3 cm echogenic focus in the right kidney which could represent a small stone. 2.  Unremarkable appearance of the left kidney. Electronically Signed   By: Emmaline KluverNancy  Ballantyne M.D.   On: 04/12/2020 11:04   DG Chest Port 1 View  Result Date: 04/11/2020 CLINICAL DATA:  Questionable sepsis EXAM: PORTABLE CHEST 1 VIEW COMPARISON:  None. FINDINGS: The lungs are clear and negative for focal airspace consolidation, pulmonary edema or suspicious pulmonary nodule. No pleural effusion or pneumothorax. Cardiac and mediastinal contours are within normal limits. No acute fracture or lytic or blastic osseous lesions. The visualized upper abdominal bowel gas pattern is unremarkable. IMPRESSION: No active disease. Electronically Signed   By: Malachy MoanHeath  McCullough M.D.   On: 04/11/2020 13:48   CT HEAD CODE STROKE WO CONTRAST  Result Date: 04/12/2020 CLINICAL DATA:  Code stroke. Initial evaluation for acute facial droop, aphasia. Of EXAM: CT HEAD WITHOUT CONTRAST TECHNIQUE: Contiguous axial images were obtained from the base of the skull through the vertex without intravenous contrast. COMPARISON:  None. FINDINGS: Brain: Cerebral volume within normal limits for age. Mild chronic microvascular ischemic disease noted within the periventricular white matter.  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 No acute intracranial hemorrhage. No acute large vessel territory infarct. No mass lesion, midline shift or mass effect. No hydrocephalus or extra-axial fluid collection. Vascular: No hyperdense vessel. Mild calcified atherosclerosis at the skull base. Skull: Scalp soft tissues and calvarium within normal limits. Sinuses/Orbits: Globes and orbital soft tissues are normal. Mild scattered mucosal thickening noted within the paranasal sinuses, with small volume opacity within the left sphenoid sinus. Mastoid air cells and middle ear cavities are clear. Other: None. ASPECTS [facility](Alberta Stroke Program Early CT Score) - Ganglionic level infarction (caudate, lentiform nuclei, internal capsule, insula, M1-M3 cortex): 7 - Supraganglionic infarction (M4-M6 cortex): 3 Total score (0-10 with 10 being  normal): 10 IMPRESSION: 1. No acute intracranial abnormality. 2. ASPECTS is 10. 3. 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 These results were communicated to Dr. Thomasena Edis at 10:30 pmon 12/11/2021by text page via the [facility] messaging system. Electronically Signed   By: Rise Mu M.D.   On: 04/12/2020 22:32        Scheduled Meds: . carbamazepine  100 mg Oral BID  . Chlorhexidine Gluconate Cloth  6 each Topical Daily  . docusate sodium  100 mg Oral BID  . gabapentin  100 mg Oral TID  . guaiFENesin  600 mg Oral BID  . heparin  5,000 Units Subcutaneous Q8H  . oseltamivir  30 mg Oral Q48H   Continuous Infusions: . sodium chloride 20 mL/hr at 04/11/20 1315  . cefTRIAXone (ROCEPHIN)  IV 1 g (04/13/20 0128)  .  sodium bicarbonate (isotonic) infusion in sterile water 125 mL/hr at 04/13/20 0637     LOS: 2 days    Time spent: 70 MIN    Burke Keels, MD [facility]  If 7PM-7AM, please contact night-coverage  04/13/2020, 12:05 PM

## 2020-04-13 NOTE — Consult Note (Addendum)
Referring Physician: Dr Loney Loh    Reason for Consult: in-house code stroke  HPI: Vicki Swanson is an 74 y.o. female with PMH of COPD, GERD, HTN who was admitted on 12/10 for weakness/fatigue, confusion and dehydration. She was found to be positive for influenza and was hypotensive upon arrival with AKI. Tamiflu was started and BP improved with IVF. COVID neg.   On evening of 12/11, RRT called Code Stroke for report of right facial droop and aphasia. By the time the night time neurologist arrived, symptoms had resolved. CTH was neg, ASPECTS 10. CTA not done as Cr is 4.32. MRI is neg for acute infarct. However, TIA wk up is indicated. There has been no further symptoms of stroke. The Vicki Swanson does have some dysarthria at baseline per husband and currently with a very dry mouth, her tongue is literally sticking to her teeth.   Interestingly, Vicki Swanson and husband describe shooting "lighning" scalp pains recently. While she describes "all my hair hurts over my entire top of head", on exam, this is mostly on the right scalp and extends throughout CN V induration. Even to light touch she yelps in pain when I move to the right side. I wonder if she actually had a facial spasm last night in relation to a trigeminal neuralgia. Furthermore, she has not been eating/drinking much because of this. Since MRI brain is neg, we discussed a trial of Tegretol for this as well as some gabapentin for the pain.   Date last known well: 04/12/20 Time last known well: 1200  tPA Given: no, outside of time window; rapid improvement back to [facility] 0  Past Medical History Past Medical History:  Diagnosis Date  . COPD (chronic obstructive pulmonary disease) (HCC)   . GERD (gastroesophageal reflux disease)   . Hypertension     Surgical History Past Surgical History:  Procedure Laterality Date  . ABDOMINAL HYSTERECTOMY    . BREAST SURGERY    . CHOLECYSTECTOMY    . TONSILLECTOMY      Family History  No family history on  file.  Social History:   reports that she has never smoked. She has never used smokeless tobacco. She reports that she does not drink alcohol and does not use drugs.  Allergies:  No Known Allergies  Home Medications:  Medications Prior to Admission  Medication Sig Dispense Refill  . ascorbic acid (VITAMIN C) 500 MG tablet Take 500 mg by mouth daily.    Marland Kitchen aspirin 325 MG tablet Take 325 mg by mouth daily.    . B Complex Vitamins (VITAMIN B COMPLEX) TABS Take 1 tablet by mouth daily.    Marland Kitchen gabapentin (NEURONTIN) 300 MG capsule Take 300 mg by mouth daily.    Marland Kitchen lisinopril (ZESTRIL) 20 MG tablet Take 20 mg by mouth daily.    . pantoprazole (PROTONIX) 40 MG tablet Take 40 mg by mouth daily before breakfast.    . potassium chloride SA (KLOR-CON) 20 MEQ tablet Take 20 mEq by mouth daily.    . pravastatin (PRAVACHOL) 20 MG tablet Take 20 mg by mouth every evening.    . propranolol (INDERAL) 40 MG tablet Take 40 mg by mouth daily.    . sertraline (ZOLOFT) 100 MG tablet Take 100 mg by mouth daily.    Marland Kitchen topiramate (TOPAMAX) 25 MG tablet Take 50 mg by mouth 2 (two) times daily.      Hospital Medications . Chlorhexidine Gluconate Cloth  6 each Topical Daily  . docusate sodium  100 mg  Oral BID  . guaiFENesin  600 mg Oral BID  . heparin  5,000 Units Subcutaneous Q8H  . oseltamivir  30 mg Oral Q48H    ROS:  History obtained from Vicki Swanson and husband collaborating  General ROS: + for  Fatigue. negative for - chills,, fever, night sweats, weight gain or weight loss Psychological ROS: negative for - behavioral disorder, hallucinations, memory difficulties, mood swings or suicidal ideation Ophthalmic ROS: negative for - blurry vision, double vision, eye pain or loss of vision ENT ROS: negative for - epistaxis, nasal discharge, oral lesions, sore throat, tinnitus or vertigo Allergy and Immunology ROS: negative for - hives or itchy/watery eyes Hematological and Lymphatic ROS: negative for - bleeding  problems, bruising or swollen lymph nodes Endocrine ROS: negative for - galactorrhea, hair pattern changes, polydipsia/polyuria or temperature intolerance Respiratory ROS: negative for - cough, hemoptysis, shortness of breath or wheezing Cardiovascular ROS: negative for - chest pain, dyspnea on exertion, edema or irregular heartbeat Gastrointestinal ROS: negative for - abdominal pain, diarrhea, hematemesis, nausea/vomiting or stool incontinence Genito-Urinary ROS: negative for - dysuria, hematuria, incontinence or urinary frequency/urgency Musculoskeletal ROS: negative for - joint swelling or muscular weakness Neurological ROS: as noted in HPI Dermatological ROS: negative for rash and skin lesion changes   Physical Examination:  Vitals:   04/13/20 0043 04/13/20 0200 04/13/20 0448 04/13/20 0700  BP: (!) 187/82 (!) 159/75 (!) 148/59 (!) 152/57  Pulse: 97 98 68 64  Resp:   18 18  Temp: 98.8 F (37.1 C) 97.9 F (36.6 C) 97.7 F (36.5 C) (!) 97.5 F (36.4 C)  TempSrc: Oral Oral Oral Oral  SpO2: 98% 98% 98% 99%  Weight:      Height:        General: Appears well-developed, mild distress, gen discomfort.  Psych: Affect appropriate to situation Eyes: No scleral injection HENT: No OP obstrucion Head: Normocephalic.  Cardiovascular: Normal rate and regular rhythm.  Respiratory: Effort normal and breath sounds normal to anterior ascultation GI: Soft.  No distension. There is no tenderness.  Skin: WDI    Neurological Examination Mental Status: Alert, oriented, thought content appropriate.  Speech fluent without evidence of aphasia. Able to follow 3 step commands without difficulty. She does have some dysarthria at baseline per husband (Currently her mouth is so dry her tongue is stuck to her teeth). Cranial Nerves: II: Visual fields grossly normal,  III,IV, VI: ptosis not present, extra-ocular motions intact bilaterally, pupils equal, round, reactive to light and accommodation V,VII:  smile symmetric, facial light touch sensation intact and in fact very sensitive on right.   VIII: hearing normal bilaterally IX,X: uvula rises symmetrically XI: bilateral shoulder shrug XII: midline tongue extension Motor: Diffusely weak and fatigues easily, barely able to lift legs, but are equal. No focal deficits. Tone and bulk:normal tone throughout; no atrophy noted Sensory: Pinprick and light touch intact throughout, bilaterally Deep Tendon Reflexes: 2+ and symmetric throughout Plantars: Right: downgoing   Left: downgoing Cerebellar: normal finger-to-nose, normal rapid alternating movements and normal heel-to-shin test Gait: normal gait and station NIHSS 1a Level of Conscious.:  1b LOC Questions:  1c LOC Commands:  2 Best Gaze:  3 Visual:  4 Facial Palsy:  5a Motor Arm - Left: 5b Motor Arm - Right:  6a Motor Leg - Left:  6b Motor Leg - Right:  7 Limb Ataxia:  8 Sensory:  9 Best Language:  10 Dysarthria:  11 Extinct. and Inatten.:  TOTAL: 0  LABORATORY STUDIES:  Basic Metabolic Panel:  Recent Labs  Lab 04/11/20 1250 04/12/20 0456 04/13/20 0411  NA 134* 139 144  K 4.4 4.0 3.7  CL 105 115* 120*  CO2 10* 10* 13*  GLUCOSE 124* 121* 121*  BUN 105* 95* 75*  CREATININE 10.72* 8.41* 4.32*  CALCIUM 8.7* 8.0* 8.1*    Liver Function Tests: Recent Labs  Lab 04/11/20 1250 04/12/20 0456  AST 11* 11*  ALT 11 9  ALKPHOS 98 68  BILITOT 0.7 0.4  PROT 7.9 5.6*  ALBUMIN 4.3 3.0*   No results for input(s): LIPASE, AMYLASE in the last 168 hours. Recent Labs  Lab 04/12/20 2307  AMMONIA 23    CBC: Recent Labs  Lab 04/11/20 1250 04/12/20 0456  WBC 9.4 6.2  NEUTROABS 5.5  --   HGB 15.9* 12.4  HCT 45.3 34.4*  MCV 88.5 87.5  PLT 231 118*    Cardiac Enzymes: No results for input(s): CKTOTAL, CKMB, CKMBINDEX, TROPONINI in the last 168 hours.  BNP: Invalid input(s): POCBNP  CBG: Recent Labs  Lab 04/11/20 1249 04/12/20 2143  GLUCAP 110* 126*     Microbiology:   Coagulation Studies: Recent Labs    04/11/20 1250  LABPROT 13.8  INR 1.1    Urinalysis:  Recent Labs  Lab 04/11/20 1700  COLORURINE YELLOW  LABSPEC >1.030*  PHURINE 5.0  GLUCOSEU NEGATIVE  HGBUR SMALL*  BILIRUBINUR NEGATIVE  KETONESUR NEGATIVE  PROTEINUR 30*  NITRITE NEGATIVE  LEUKOCYTESUR MODERATE*    Lipid Panel:     Component Value Date/Time   CHOL 143 04/13/2020 1023   TRIG 159 (H) 04/13/2020 1023   HDL 24 (L) 04/13/2020 1023   CHOLHDL 6.0 04/13/2020 1023   VLDL 32 04/13/2020 1023   LDLCALC 87 04/13/2020 1023   IMAGING: DG Abd 1 View  Result Date: 04/13/2020 CLINICAL DATA:  Metal implant.  MRI. EXAM: ABDOMEN - 1 VIEW COMPARISON:  None. FINDINGS: There are metallic clips in the right upper quadrant likely related to prior cholecystectomy. These are not a contraindication to MRI. 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 The bowel gas pattern is nonobstructive. IMPRESSION: Metallic clips in the right upper quadrant likely related to prior cholecystectomy. These are not a contraindication to MRI. Nonobstructive bowel gas pattern. Electronically Signed   By: Katherine Mantle M.D.   On: 04/13/2020 00:03   CT HEAD WO CONTRAST  Result Date: 04/11/2020 CLINICAL DATA:  Confusion for a week. Diarrhea last weekend. Weakness. EXAM: CT HEAD WITHOUT CONTRAST TECHNIQUE: Contiguous axial images were obtained from the base of the skull through the vertex without intravenous contrast. COMPARISON:  Head CT, 06/07/2016 FINDINGS: Brain: No evidence of acute infarction, hemorrhage, hydrocephalus, extra-axial collection or mass lesion/mass effect. Small area of lucency in the right basal ganglia consistent with a old lacunar infarct. Minor areas of white matter hypoattenuation are noted bilaterally consistent with chronic microvascular ischemic change. Vascular: No hyperdense  vessel or unexpected calcification. Skull: Normal. Negative for fracture or focal lesion. Sinuses/Orbits: Globes and orbits are unremarkable. Visualized sinuses are clear. Other: None. IMPRESSION: 1. No acute intracranial abnormalities. Electronically Signed   By: Amie Portland M.D.   On: 04/11/2020 13:49   MR BRAIN WO CONTRAST  Result Date: 04/13/2020 CLINICAL DATA:  Initial evaluation for neuro deficit, stroke suspected. EXAM: MRI HEAD WITHOUT CONTRAST TECHNIQUE: Multiplanar, multiecho pulse sequences of the brain and surrounding structures were obtained without intravenous contrast. COMPARISON:  Prior CT from 04/12/2020. FINDINGS: Brain:  Diffuse prominence of the CSF containing spaces compatible with generalized age-related cerebral atrophy. Mild scattered patchy T2/FLAIR hyperintensity within the periventricular and deep white matter both cerebral hemispheres as well as the pons, most likely related chronic microvascular ischemic disease, mild for age. 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 No abnormal foci of restricted diffusion to suggest acute or subacute ischemia. Gray-white matter differentiation maintained. No encephalomalacia to suggest chronic cortical infarction. No foci of susceptibility artifact to suggest acute or chronic intracranial hemorrhage. No mass lesion, midline shift or mass effect. No hydrocephalus or extra-axial fluid collection. Pituitary gland suprasellar region normal. Midline structures intact. Vascular: Major intracranial vascular flow voids are maintained. Skull and upper cervical spine: Craniocervical junction within normal limits. Bone marrow signal intensity normal. No scalp soft tissue abnormality. Sinuses/Orbits: Patient status post bilateral ocular lens replacement. Globes and orbital soft tissues demonstrate no acute finding. Mild scattered mucosal thickening noted within the ethmoidal air cells, sphenoid sinuses, and maxillary sinuses.  Air-fluid level within the left sphenoid sinus suggestive of acute sinusitis. No significant mastoid effusion. Inner ear structures normal. Other: None. IMPRESSION: 1. No acute intracranial abnormality. 2. Age-related cerebral atrophy with mild chronic small vessel ischemic disease. 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 3. Air-fluid level within the left sphenoid sinus suggestive of acute sinusitis. Electronically Signed   By: Rise Mu M.D.   On: 04/13/2020 00:30   US RENAL  Result Date: 04/12/2020 CLINICAL DATA:  Acute renal failure EXAM: RENAL / URINARY TRACT ULTRASOUND COMPLETE COMPARISON:  None. FINDINGS: Right Kidney: Renal measurements: 9.1 x 4.6 x 4.7 cm = volume: 102 mL. Echogenicity appears within normal limits. There is mild right hydronephrosis. There is a 0.3 cm echogenic focus which is nonspecific but possibly represents a small stone. No renal mass. Left Kidney: Renal measurements: 8.8 x 4.7 x 4.2 cm = volume: 90 mL. Echogenicity appears within normal limits. No hydronephrosis. No renal calculus. No renal mass identified. Bladder: Decompressed with Foley catheter in place. Other: None. IMPRESSION: 1. Mild right kidney hydronephrosis. There is a 0.3 cm echogenic focus in the right kidney which could represent a small stone. 2.  Unremarkable appearance of the left kidney. Electronically Signed   By: Emmaline Kluver M.D.   On: 04/12/2020 11:04   DG Chest Port 1 View  Result Date: 04/11/2020 CLINICAL DATA:  Questionable sepsis EXAM: PORTABLE CHEST 1 VIEW COMPARISON:  None. FINDINGS: The lungs are clear and negative for focal airspace consolidation, pulmonary edema or suspicious pulmonary nodule. No pleural effusion or pneumothorax. Cardiac and mediastinal contours are within normal limits. No acute fracture or lytic or blastic osseous lesions. The visualized upper abdominal bowel gas pattern is unremarkable. IMPRESSION: No active disease.  Electronically Signed   By: Malachy Moan M.D.   On: 04/11/2020 13:48   CT HEAD CODE STROKE WO CONTRAST  Result Date: 04/12/2020 CLINICAL DATA:  Code stroke. Initial evaluation for acute facial droop, aphasia. Of EXAM: CT HEAD WITHOUT CONTRAST TECHNIQUE: Contiguous axial images were obtained from the base of the skull through the vertex without intravenous contrast. COMPARISON:  None. FINDINGS: Brain: Cerebral volume within normal limits for age. Mild chronic microvascular ischemic disease noted within the periventricular white matter. 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 No acute intracranial hemorrhage. No acute large vessel territory infarct. No mass lesion, midline shift or mass effect. No hydrocephalus or extra-axial fluid collection. Vascular: No hyperdense vessel. Mild calcified atherosclerosis at the  skull base. Skull: Scalp soft tissues and calvarium within normal limits. Sinuses/Orbits: Globes and orbital soft tissues are normal. Mild scattered mucosal thickening noted within the paranasal sinuses, with small volume opacity within the left sphenoid sinus. Mastoid air cells and middle ear cavities are clear. Other: None. ASPECTS [facility](Al[place]ta Stroke Program Early CT Score) - Ganglionic level infarction (caudate, lentiform nuclei, internal capsule, insula, M1-M3 cortex): 7 - Supraganglionic infarction (M4-M6 cortex): 3 Total score (0-10 with 10 being normal): 10 IMPRESSION: 1. No acute intracranial abnormality. 2. ASPECTS is 10. 3. 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 These results were communicated to Dr. Thomasena Edisollins at 10:30 pmon 12/11/2021by text page via the [facility] system. Electronically Signed   By: Rise MuBenjamin  McClintock M.D.   On: 04/12/2020 22:32   Assessment:  Ms. Dalphine HandingJoan Klayman is a 74 y.o. female with history of COPD, GERD, HTN who was admitted on 12/10 for weakness/fatigue, confusion  and dehydration. She was found to be positive for influenza and was hypotensive upon arrival with AKI. Tamiflu was started and BP improved with IVF. On evening of 12/11 Code stroke was called for right facial weakness and aphasia. Warrants TIA work up, but will wait for results to add statin /ASA at this time given the findings of trigeminal neurologia.  After careful exam and discussion, Vicki Swanson has been having shooting "lighning" scalp pains recently. This is mostly on the right scalp and extends throughout CN V induration. Even to light touch she yelps in pain when I move to the right side. I wonder if she actually had a facial spasm last night in relation to a trigeminal neuralgia.   1. 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 Baseline dysarthria- unclear etiology, husband describes as mild, but that she has "always" slurred words, but he states this was worse yesterday.  4. AKI- likely contributing to her confusion 5. Influenza- contributing to her dehydration and confusion   Plan:  HgbA1c, fasting lipid panel  MRA  of the brain without contrast  Echocardiogram  Carotid dopplers   Prophylactic therapy - Antiplatelet medication, pending wk up  Normotensive BP goals  Statin Therapy - pending wk up  Risk factor modification  Telemetry monitoring  Frequent neuro checks  Gabapention 100mg  TID po   Tegretol 100mg  BID po    Attending Neurologist's note to follow  Desiree Metzger-Cihelka, ARNP-C, ANVP-BC Pager: [phone]  I have seen the patient reviewed the above note.  The symptoms were observed by a rapid response nurse, and therefore I would consider them relatively reliable.  We will pursue a TIA work-up given the transient nature of the symptoms.  Though pain could have played some role, this is not definite.  TIA work-up as above.  Ritta SlotMcNeill Abhi Moccia, MD [facility] [phone]  If 7pm-  7am, please page neurology on call as listed in [facility].

## 2020-04-13 NOTE — Evaluation (Signed)
Clinical/Bedside Swallow Evaluation Patient Details  Name: Vicki Swanson MRN: [id number] Date of Birth: [birth year]  Today's Date: 04/13/2020 Time: SLP Start Time (ACUTE ONLY): 1420 SLP Stop Time (ACUTE ONLY): 1440 SLP Time Calculation (min) (ACUTE ONLY): 20 min  Past Medical History:  Past Medical History:  Diagnosis Date  . COPD (chronic obstructive pulmonary disease) (HCC)   . GERD (gastroesophageal reflux disease)   . Hypertension    Past Surgical History:  Past Surgical History:  Procedure Laterality Date  . ABDOMINAL HYSTERECTOMY    . BREAST SURGERY    . CHOLECYSTECTOMY    . TONSILLECTOMY     HPI:  Pt is an 74 y.o. female with PMH of COPD, GERD, HTN who was admitted on 12/10 for weakness/fatigue, confusion and dehydration. She was found to be positive for influenza and was hypotensive upon arrival with AKI. Rapid Response called on 12/11 due to facial droop and aphasia which resolved by the time of neurology's assessment. Neurology ?facial spasm due to trigeminal neuralgia. Pt passed yale on 12/10 prior to symptoms and was made NPO on 12/11. MRI brain negative for acute changes. MRA: Severe stenosis left ICA cavernous segment., up to moderate stenosis at the Basilar tip, with moderate to severe bilateral PCA P1 stenoses. 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 Pt found to have possible UTI.   Assessment / Plan / Recommendation Clinical Impression  Pt was seen for bedside swallow evaluation with her husband present for the latter part of the evaluation. Pt's husband reported that the pt does typically eat slowly but described swallow function with was otherwise normal. He expressed his concern regarding her mentation and expressed that is usually "pretty sharp". She was alert throughout the session but disoriented to time and exhibited difficulty  following commands. A complete oral mechanism exam could not be conducted, but strength appeared [facility] and dentition was adequate. She tolerated all solids and liquids without signs or symptoms of aspiration. Pt initially held regular texture solids between her teeth for a remarkable length of time, and exhibited moderately prolonged mastication thereafter. A dysphagia 3 diet will be initiated at this time and SLP will follow to assess tolerance and for possible advancement. SLP Visit Diagnosis: Dysphagia, unspecified (R13.10)    Aspiration Risk  Mild aspiration risk    Diet Recommendation Thin liquid;Dysphagia 3 (Mech soft)   Liquid Administration via: Cup;Straw Medication Administration: Whole meds with liquid Supervision: Patient able to self feed Postural Changes: Seated upright at 90 degrees    Other  Recommendations Oral Care Recommendations: Oral care BID   Follow up Recommendations  (TBD)      Frequency and Duration min 2x/week  1 week       Prognosis Barriers to Reach Goals: Cognitive deficits      Swallow Study   General Date of Onset: 04/12/20 HPI: Pt is an 74 y.o. female with PMH of COPD, GERD, HTN who was admitted on 12/10 for weakness/fatigue, confusion and dehydration. She was found to be positive for influenza and was hypotensive upon arrival with AKI. Rapid Response called on 12/11 due to facial droop and aphasia which resolved by the time of neurology's assessment. Neurology ?facial spasm due to trigeminal neuralgia. Pt passed yale on 12/10 prior to symptoms and was made NPO on 12/11. MRI brain negative for acute changes. 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 Pt found to have possible  UTI. Type of Study: Bedside Swallow Evaluation Previous Swallow Assessment: NOne Diet Prior to this Study: Regular;Thin liquids Temperature Spikes Noted: No Respiratory Status: Room air History of Recent Intubation: No Behavior/Cognition: Alert;Cooperative;Pleasant mood Oral Cavity Assessment: Within Functional Limits Oral Care Completed by SLP: No Oral Cavity - Dentition: Adequate natural dentition Vision: Functional for self-feeding Self-Feeding Abilities: Able to feed self Patient Positioning: Upright in bed;Postural control adequate for testing Baseline Vocal Quality: Normal Volitional Cough: Strong Volitional Swallow: Able to elicit    Oral/Motor/Sensory Function Overall Oral Motor/Sensory Function: Within functional limits   Ice Chips Ice chips: Within functional limits Presentation: Spoon   Thin Liquid Thin Liquid: Within functional limits Presentation: Straw;Cup    Nectar Thick Nectar Thick Liquid: Not tested   Honey Thick Honey Thick Liquid: Not tested   Puree Puree: Within functional limits Presentation: Spoon   Solid     Solid: Impaired Oral Phase Impairments: Impaired mastication Oral Phase Functional Implications: Oral holding;Impaired mastication     Shalaunda Weatherholtz I. Vear Clock, MS, CCC-SLP Acute Rehabilitation Services Office number [phone] Pager [phone]  Scheryl Marten 04/13/2020,3:05 PM

## 2020-04-13 NOTE — Evaluation (Signed)
Occupational Therapy Evaluation Patient Details Name: Vicki Swanson MRN: [id number] DOB: 02/18/1946 Today's Date: 04/13/2020    History of Present Illness 74 y.o. female with medical history significant of hypertension, COPD, GERD who presented to med [facility] with c/o diarrhea, fever, weakness, and fatigue. Pt admitted with acute influenza infection as well as acute kidney injury. Code stroke called 12/11. MRI negative. Code stroke canceled due to symptoms resolved.   Clinical Impression   Pt admitted with above diagnoses, presenting with cognitive deficits and generalized weakness limiting ability to engage in ADL. Unsure of exact baseline due to pt being poor historian. Pt is oriented to self only and shows very short attention span. She also requires increased time and cueing to follow simple one step commands. Pt was able to complete bed mobility at max A +2 and sit <> stands with mod A +2. Pt also presenting with neck pain which makes it difficult for her to move her head and scan environment. Attempted visual testing, but cognitive deficits limiting. Noted poor motor planning throughout session as well. Given current status, recommend SNF at d/c. Will continue to follow per POC listed below.    Follow Up Recommendations  SNF;Supervision/Assistance - 24 hour    Equipment Recommendations  Wheelchair (measurements OT);Wheelchair cushion (measurements OT);Hospital bed;3 in 1 bedside commode    Recommendations for Other Services       Precautions / Restrictions Precautions Precautions: Fall Restrictions Weight Bearing Restrictions: No      Mobility Bed Mobility Overal bed mobility: Needs Assistance Bed Mobility: Supine to Sit;Sit to Supine     Supine to sit: Max assist;+2 for physical assistance;+2 for safety/equipment Sit to supine: Max assist;+2 for physical assistance;+2 for safety/equipment   General bed mobility comments: assist with all aspects of mobility, use  of bed pad to scoot to EOB. Improved initiation of movement with sit>sup as compared with sup>sit.    Transfers Overall transfer level: Needs assistance Equipment used: 2 person hand held assist Transfers: Sit to/from Stand Sit to Stand: Mod assist;+2 physical assistance;+2 safety/equipment         General transfer comment: sit <> stand x 2 trials from EOB    Balance Overall balance assessment: Needs assistance Sitting-balance support: Feet supported;Single extremity supported Sitting balance-Leahy Scale: Fair Sitting balance - Comments: min guard assist sitting EOB   Standing balance support: Bilateral upper extremity supported;During functional activity Standing balance-Leahy Scale: Poor Standing balance comment: reliant on external support                           ADL either performed or assessed with clinical judgement   ADL Overall ADL's : Needs assistance/impaired Eating/Feeding: NPO   Grooming: Minimal assistance;Sitting   Upper Body Bathing: Moderate assistance;Sitting   Lower Body Bathing: Maximal assistance;Sitting/lateral leans;Sit to/from stand   Upper Body Dressing : Moderate assistance;Sitting   Lower Body Dressing: Maximal assistance;Sitting/lateral leans;Sit to/from stand   Toilet Transfer: Moderate assistance;+2 for physical assistance;+2 for safety/equipment;Stand-pivot   Toileting- Clothing Manipulation and Hygiene: Total assistance       Functional mobility during ADLs: Moderate assistance;+2 for physical assistance;+2 for safety/equipment General ADL Comments: decreased cognition limiting engagement in ADLs     Vision Patient Visual Report: No change from baseline       Perception     Praxis      Pertinent Vitals/Pain Pain Assessment: Faces Faces Pain Scale: Hurts little more Pain Location: neck musculature Pain Descriptors /  Indicators: Grimacing;Guarding;Discomfort;Tightness;Tender Pain Intervention(s): Limited activity  within patient's tolerance;Monitored during session;Repositioned     Hand Dominance     Extremity/Trunk Assessment Upper Extremity Assessment Upper Extremity Assessment: Generalized weakness   Lower Extremity Assessment Lower Extremity Assessment: Generalized weakness   Cervical / Trunk Assessment Cervical / Trunk Assessment: Other exceptions Cervical / Trunk Exceptions: tightness of cervical musculature resulting in R rotation and L sidebend. Painful palpation and ROM.   Communication Communication Communication: No difficulties   Cognition Arousal/Alertness: Awake/alert Behavior During Therapy: Flat affect Overall Cognitive Status: Impaired/Different from baseline Area of Impairment: Orientation;Attention;Memory;Following commands;Safety/judgement;Awareness;Problem solving                 Orientation Level: Disoriented to;Place;Time;Situation Current Attention Level: Focused Memory: Decreased short-term memory Following Commands: Follows one step commands inconsistently;Follows one step commands with increased time Safety/Judgement: Decreased awareness of safety;Decreased awareness of deficits Awareness: Intellectual Problem Solving: Slow processing;Decreased initiation;Difficulty sequencing;Requires verbal cues;Requires tactile cues General Comments: Husband reports confusion at baseline but current presentation is a decline from baseline. Very poor attention span, only focusing on a task for a few seconds. Perseverating on words. Pt confused to time; therefore, told that the month is December. 'December' was then her answer when asked the year. Pt told she was at [facility], and then asked what city she was from. Her answer was [place]   General Comments  VSS on RA    Exercises     Shoulder Instructions      Home Living Family/patient expects to be discharged to:: Private residence Living Arrangements: Spouse/significant other Available Help at Discharge:  Family;Available 24 hours/day Type of Home: House Home Access: Stairs to enter [employer] of Steps: multi steps, split level house   Home Layout: Two level         Bathroom Toilet: Standard     Home Equipment: None          Prior Functioning/Environment Level of Independence: Independent        Comments: Pt is a poor historian. Above information confirmed with husband.        OT Problem List: Decreased strength;Decreased knowledge of use of DME or AE;Decreased knowledge of precautions;Decreased activity tolerance;Impaired balance (sitting and/or standing);Decreased safety awareness;Decreased cognition      OT Treatment/Interventions: Self-care/ADL training;Therapeutic exercise;Patient/family education;Balance training;Energy conservation;Therapeutic activities;DME and/or AE instruction;Cognitive remediation/compensation    OT Goals(Current goals can be found in the care plan section) Acute Rehab OT Goals Patient Stated Goal: not stated OT Goal Formulation: Patient unable to participate in goal setting Time For Goal Achievement: 04/27/20 Potential to Achieve Goals: Good  OT Frequency: Min 2X/week   Barriers to D/C:            Co-evaluation PT/OT/SLP Co-Evaluation/Treatment: Yes Reason for Co-Treatment: Complexity of the patient's impairments (multi-system involvement);For patient/therapist safety;To address functional/ADL transfers PT goals addressed during session: Mobility/safety with mobility;Balance OT goals addressed during session: ADL's and self-care;Proper use of Adaptive equipment and DME      AM-PAC OT "6 Clicks" Daily Activity     Outcome Measure Help from another person eating meals?: A Little Help from another person taking care of personal grooming?: A Little Help from another person toileting, which includes using toliet, bedpan, or urinal?: A Lot Help from another person bathing (including washing, rinsing, drying)?: A Lot Help from  another person to put on and taking off regular upper body clothing?: A Little Help from another person to put on and taking off regular lower body clothing?:  A Lot 6 Click Score: 15   End of Session Equipment Utilized During Treatment: Gait belt Nurse Communication: Mobility status  Activity Tolerance: Patient tolerated treatment well Patient left: in bed;with call bell/phone within reach;with bed alarm set  OT Visit Diagnosis: Unsteadiness on feet (R26.81);Other abnormalities of gait and mobility (R26.89);Muscle weakness (generalized) (M62.81);Other symptoms and signs involving cognitive function                Time: 5597-4163 OT Time Calculation (min): 30 min Charges:  OT General Charges $OT Visit: 1 Visit OT Evaluation $OT Eval Moderate Complexity: 1 Mod  Dalphine Handing, MSOT, OTR/L Acute Rehabilitation Services [facility] Office Number: [phone] Pager: [phone]  Dalphine Handing 04/13/2020, 12:22 PM

## 2020-04-13 NOTE — Progress Notes (Signed)
ABG drawn and sent to lab

## 2020-04-14 ENCOUNTER — Inpatient Hospital Stay (HOSPITAL_COMMUNITY): Payer: Medicare Other

## 2020-04-14 DIAGNOSIS — I479 Paroxysmal tachycardia, unspecified: Secondary | ICD-10-CM

## 2020-04-14 DIAGNOSIS — G459 Transient cerebral ischemic attack, unspecified: Secondary | ICD-10-CM

## 2020-04-14 DIAGNOSIS — I6389 Other cerebral infarction: Secondary | ICD-10-CM

## 2020-04-14 DIAGNOSIS — M5481 Occipital neuralgia: Secondary | ICD-10-CM

## 2020-04-14 LAB — BASIC METABOLIC PANEL
Anion gap: 13 (ref 5–15)
BUN: 51 mg/dL — ABNORMAL HIGH (ref 8–23)
CO2: 25 mmol/L (ref 22–32)
Calcium: 7.7 mg/dL — ABNORMAL LOW (ref 8.9–10.3)
Chloride: 108 mmol/L (ref 98–111)
Creatinine, Ser: 2.06 mg/dL — ABNORMAL HIGH (ref 0.44–1.00)
GFR, Estimated: 25 mL/min — ABNORMAL LOW (ref >60)
Glucose, Bld: 116 mg/dL — ABNORMAL HIGH (ref 70–99)
Potassium: 3 mmol/L — ABNORMAL LOW (ref 3.5–5.1)
Sodium: 146 mmol/L — ABNORMAL HIGH (ref 135–145)

## 2020-04-14 LAB — ECHOCARDIOGRAM COMPLETE
Area-P 1/2: 2.42 cm2
Height: 60 in
S' Lateral: 2.2 cm
Weight: 2458.64 oz

## 2020-04-14 LAB — URINE CULTURE: Culture: 10000 — AB

## 2020-04-14 MED ORDER — ATORVASTATIN CALCIUM 40 MG PO TABS
40.0000 mg | ORAL_TABLET | Freq: Every day | ORAL | Status: DC
  Administered 2020-04-14 – 2020-04-17 (×4): 40 mg via ORAL
  Filled 2020-04-14 (×4): qty 1

## 2020-04-14 MED ORDER — CLOPIDOGREL BISULFATE 75 MG PO TABS
75.0000 mg | ORAL_TABLET | Freq: Every day | ORAL | Status: DC
  Administered 2020-04-14 – 2020-04-17 (×4): 75 mg via ORAL
  Filled 2020-04-14 (×4): qty 1

## 2020-04-14 MED ORDER — PROMETHAZINE HCL 25 MG/ML IJ SOLN
12.5000 mg | Freq: Three times a day (TID) | INTRAMUSCULAR | Status: DC | PRN
  Administered 2020-04-14: 15:00:00 12.5 mg via INTRAVENOUS
  Filled 2020-04-14: qty 1

## 2020-04-14 MED ORDER — SODIUM CHLORIDE 0.9 % IV SOLN: INTRAVENOUS | Status: DC

## 2020-04-14 MED ORDER — POTASSIUM CHLORIDE 10 MEQ/100ML IV SOLN
10.0000 meq | INTRAVENOUS | Status: AC
  Administered 2020-04-14 (×4): 10 meq via INTRAVENOUS
  Filled 2020-04-14 (×4): qty 100

## 2020-04-14 NOTE — Progress Notes (Signed)
Patient ID: Vicki Swanson, female   DOB: 04/07/1946, 74 y.o.   MRN: [id number] [facility] Progress Note   Assessment/ Plan:   1. Acute kidney Injury on chronic kidney disease stage III: This appears to have been hemodynamically mediated with prerenal azotemia from diarrhea/decreased oral intake.  Renal function improving well with ongoing intravenous fluids with consistent downtrend of BUN and creatinine.  Her hydronephrosis is not felt to be significant enough to warrant additional evaluation given consistent improvement of renal function.  Agree with electrolyte replacements. 2.  Hypokalemia: Secondary to GI losses/limited oral intake and shifts with sodium bicarbonate administration.  Status post replacement. 3.  Encephalopathy: Status post code stroke with MRI/MRA showing severe intracranial atherosclerotic changes with age-related brain atrophy.  On secondary prophylaxis with aspirin.  Possibly from polypharmacy versus acute renal injury. 4.  Acute influenza A infection: On oseltamavir day 4 of 5 and ongoing intravenous volume resuscitation.  Blood cultures negative to date. 5.  History of atrial fibrillation: Rate controlled on propranolol.  Subjective:   Continues to have some nausea today.  Denies any chest pain or shortness of breath.   Objective:   BP (!) 173/98 (BP Location: Right Arm)   Pulse 67   Temp 97.6 F (36.4 C) (Axillary)   Resp (!) 24   Ht 5' (1.524 m)   Wt 69.7 kg   SpO2 99%   BMI 30.01 kg/m   Intake/Output Summary (Last 24 hours) at 04/14/2020 1519 Last data filed at 04/14/2020 1500 Gross per 24 hour  Intake 2440 ml  Output 1150 ml  Net 1290 ml   Weight change:   Physical Exam: Gen: Comfortably sitting up in recliner, husband at her side. CVS: Pulse regular rhythm, normal rate, S1 and S2 normal Resp: Diminished breath sounds over bases otherwise clear to auscultation, no rales/rhonchi Abd: Soft, obese, nontender, bowel sounds normal Ext: No  lower extremity edema  Imaging: DG Abd 1 View  Result Date: 04/13/2020 CLINICAL DATA:  Metal implant.  MRI. EXAM: ABDOMEN - 1 VIEW COMPARISON:  None. FINDINGS: There are metallic clips in the right upper quadrant likely related to prior cholecystectomy. These are not a contraindication to MRI. 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 The bowel gas pattern is nonobstructive. IMPRESSION: Metallic clips in the right upper quadrant likely related to prior cholecystectomy. These are not a contraindication to MRI. Nonobstructive bowel gas pattern. Electronically Signed   By: Katherine Mantle M.D.   On: 04/13/2020 00:03   MR ANGIO HEAD WO CONTRAST  Result Date: 04/13/2020 CLINICAL DATA:  74 year old female code stroke presentation yesterday. No acute finding on MRI. EXAM: MRA HEAD WITHOUT CONTRAST TECHNIQUE: Angiographic images of the Circle of Willis were obtained using MRA technique without intravenous contrast. COMPARISON:  Brain MRI 0004 hours today and earlier. FINDINGS: No intracranial mass effect or ventriculomegaly. Antegrade flow in the posterior circulation. Dominant appearing distal left vertebral artery and moderate to severe stenosis of the right vertebral V4 segment (series 1055, image 9). The right AICA appears to be dominant and is patent. Mild irregularity and stenosis of the left V4 segment just upstream of the normal left PICA origin. Patent vertebrobasilar junction. Mild proximal basilar artery stenosis. More moderate irregularity and stenosis of the basilar tip, but the SCA and PCA origins remain patent. Superimposed moderate to severe left and severe short segment right PCA P1 stenoses. Bilateral PCA branches remain patent although are mildly  irregular, more so the left. Antegrade flow in both ICA siphons with artifacts suspected on the lowest image. Both siphons are patent although irregular and there  is evidence of severe left siphon stenosis in the cavernous segment (flow gap which also corresponds to an area of flow void irregularity on the earlier MRI). On the right side sign some only mild siphon stenosis is suspected. Both carotid termini remain patent. MCA and ACA origins are within normal limits. Visible ACA branches are within normal limits. MCA M1 segments and MCA bi/trifurcations are within normal limits. Visible bilateral MCA branches are mildly irregular. IMPRESSION: 1. Evidence of severe intracranial atherosclerosis, most notably: - Severe stenosis Left ICA cavernous segment. - up to Moderate stenosis at the Basilar tip, with moderate to Severe bilateral PCA P1 stenoses. - Moderate to severe Right Vertebral V4 segment stenosis. 2. Additional mild stenoses including distal Left vertebral artery, proximal Basilar artery, Right siphon. 3. Neurointerventional Radiology consultation would be valuable. Electronically Signed   By: Odessa Fleming M.D.   On: 04/13/2020 13:48   MR BRAIN WO CONTRAST  Result Date: 04/13/2020 CLINICAL DATA:  Initial evaluation for neuro deficit, stroke suspected. EXAM: MRI HEAD WITHOUT CONTRAST TECHNIQUE: Multiplanar, multiecho pulse sequences of the brain and surrounding structures were obtained without intravenous contrast. COMPARISON:  Prior CT from 04/12/2020. FINDINGS: Brain: Diffuse prominence of the CSF containing spaces compatible with generalized age-related cerebral atrophy. Mild scattered patchy T2/FLAIR hyperintensity within the periventricular and deep white matter both cerebral hemispheres as well as the pons, most likely related chronic microvascular ischemic disease, mild for age. 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 No abnormal foci of restricted diffusion to suggest acute or subacute ischemia. Gray-white matter differentiation maintained. No encephalomalacia to suggest chronic cortical infarction. No foci of susceptibility  artifact to suggest acute or chronic intracranial hemorrhage. No mass lesion, midline shift or mass effect. No hydrocephalus or extra-axial fluid collection. Pituitary gland suprasellar region normal. Midline structures intact. Vascular: Major intracranial vascular flow voids are maintained. Skull and upper cervical spine: Craniocervical junction within normal limits. Bone marrow signal intensity normal. No scalp soft tissue abnormality. Sinuses/Orbits: Patient status post bilateral ocular lens replacement. Globes and orbital soft tissues demonstrate no acute finding. Mild scattered mucosal thickening noted within the ethmoidal air cells, sphenoid sinuses, and maxillary sinuses. Air-fluid level within the left sphenoid sinus suggestive of acute sinusitis. No significant mastoid effusion. Inner ear structures normal. Other: None. IMPRESSION: 1. No acute intracranial abnormality. 2. Age-related cerebral atrophy with mild chronic small vessel ischemic disease. 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 3. Air-fluid level within the left sphenoid sinus suggestive of acute sinusitis. Electronically Signed   By: Rise Mu M.D.   On: 04/13/2020 00:30   ECHOCARDIOGRAM COMPLETE  Result Date: 04/14/2020    ECHOCARDIOGRAM REPORT   Patient Name:   COLLEENA KURTENBACH Date of Exam: 04/14/2020 Medical Rec #:  [id number]    Height:       60.0 in Accession #:    [id number]   Weight:       153.7 lb Date of Birth:  1946/04/27    BSA:          1.669 m Patient Age:    74 years     BP:           169/64 mmHg Patient Gender: F            HR:  48 bpm. Exam Location:  Inpatient Procedure: 2D Echo, Color Doppler and Cardiac Doppler Indications:    Stroke i163.9  History:        Patient has prior history of Echocardiogram examinations, most                 recent 09/19/2019. COPD, Arrythmias:Atrial Fibrillation; Risk                 Factors:Hypertension. Prior performed at [facility].  Sonographer:     Irving Burton Senior RDCS Referring Phys: [phone] DESIREE METZGER-CIHELKA  Sonographer Comments: Suboptimal parasternal window due to COPD IMPRESSIONS  1. Left ventricular ejection fraction, by estimation, is 65 to 70%. The left ventricle has normal function. The left ventricle has no regional wall motion abnormalities. There is mild left ventricular hypertrophy. Left ventricular diastolic parameters are consistent with Grade II diastolic dysfunction (pseudonormalization).  2. Right ventricular systolic function is normal. The right ventricular size is normal. Tricuspid regurgitation signal is inadequate for assessing PA pressure.  3. Left atrial size was mildly dilated.  4. Right atrial size was mildly dilated.  5. The mitral valve is normal in structure. Trivial mitral valve regurgitation. No evidence of mitral stenosis.  6. The aortic valve is tricuspid. Aortic valve regurgitation is not visualized. Mild aortic valve sclerosis is present, with no evidence of aortic valve stenosis.  7. The inferior vena cava is normal in size with greater than 50% respiratory variability, suggesting right atrial pressure of 3 mmHg. FINDINGS  Left Ventricle: Left ventricular ejection fraction, by estimation, is 65 to 70%. The left ventricle has normal function. The left ventricle has no regional wall motion abnormalities. The left ventricular internal cavity size was normal in size. There is  mild left ventricular hypertrophy. Left ventricular diastolic parameters are consistent with Grade II diastolic dysfunction (pseudonormalization). Right Ventricle: The right ventricular size is normal. No increase in right ventricular wall thickness. Right ventricular systolic function is normal. Tricuspid regurgitation signal is inadequate for assessing PA pressure. Left Atrium: Left atrial size was mildly dilated. Right Atrium: Right atrial size was mildly dilated. Pericardium: Trivial pericardial effusion is present. Mitral Valve: The mitral  valve is normal in structure. There is mild thickening of the mitral valve leaflet(s). Trivial mitral valve regurgitation. No evidence of mitral valve stenosis. Tricuspid Valve: The tricuspid valve is normal in structure. Tricuspid valve regurgitation is not demonstrated. Aortic Valve: The aortic valve is tricuspid. Aortic valve regurgitation is not visualized. Mild aortic valve sclerosis is present, with no evidence of aortic valve stenosis. Pulmonic Valve: The pulmonic valve was normal in structure. Pulmonic valve regurgitation is not visualized. Aorta: The aortic root is normal in size and structure. Venous: The inferior vena cava is normal in size with greater than 50% respiratory variability, suggesting right atrial pressure of 3 mmHg. IAS/Shunts: No atrial level shunt detected by color flow Doppler.  LEFT VENTRICLE PLAX 2D LVIDd:         3.50 cm  Diastology LVIDs:         2.20 cm  LV e' medial:    6.42 cm/s LV PW:         1.20 cm  LV E/e' medial:  17.8 LV IVS:        1.10 cm  LV e' lateral:   6.64 cm/s LVOT diam:     1.80 cm  LV E/e' lateral: 17.2 LV SV:         75 LV SV Index:   45 LVOT Area:  2.54 cm  RIGHT VENTRICLE RV S prime:     14.10 cm/s TAPSE (M-mode): 2.3 cm LEFT ATRIUM             Index       RIGHT ATRIUM           Index LA diam:        2.90 cm 1.74 cm/m  RA Area:     11.00 cm LA Vol (A2C):   68.2 ml 40.87 ml/m RA Volume:   22.20 ml  13.30 ml/m LA Vol (A4C):   41.9 ml 25.11 ml/m LA Biplane Vol: 56.2 ml 33.68 ml/m  AORTIC VALVE LVOT Vmax:   127.00 cm/s LVOT Vmean:  84.700 cm/s LVOT VTI:    0.295 m  AORTA Ao Root diam: 2.50 cm MITRAL VALVE MV Area (PHT): 2.42 cm     SHUNTS MV Decel Time: 313 msec     Systemic VTI:  0.30 m MV E velocity: 114.00 cm/s  Systemic Diam: 1.80 cm MV A velocity: 77.80 cm/s MV E/A ratio:  1.47 Marca Ancona MD Electronically signed by Marca Ancona MD Signature Date/Time: 04/14/2020/2:20:17 PM    Final    CT HEAD CODE STROKE WO CONTRAST  Result Date:  04/12/2020 CLINICAL DATA:  Code stroke. Initial evaluation for acute facial droop, aphasia. Of EXAM: CT HEAD WITHOUT CONTRAST TECHNIQUE: Contiguous axial images were obtained from the base of the skull through the vertex without intravenous contrast. COMPARISON:  None. FINDINGS: Brain: Cerebral volume within normal limits for age. Mild chronic microvascular ischemic disease noted within the periventricular white matter. 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 No acute intracranial hemorrhage. No acute large vessel territory infarct. No mass lesion, midline shift or mass effect. No hydrocephalus or extra-axial fluid collection. Vascular: No hyperdense vessel. Mild calcified atherosclerosis at the skull base. Skull: Scalp soft tissues and calvarium within normal limits. Sinuses/Orbits: Globes and orbital soft tissues are normal. Mild scattered mucosal thickening noted within the paranasal sinuses, with small volume opacity within the left sphenoid sinus. Mastoid air cells and middle ear cavities are clear. Other: None. ASPECTS [facility] Stroke Program Early CT Score) - Ganglionic level infarction (caudate, lentiform nuclei, internal capsule, insula, M1-M3 cortex): 7 - Supraganglionic infarction (M4-M6 cortex): 3 Total score (0-10 with 10 being normal): 10 IMPRESSION: 1. No acute intracranial abnormality. 2. ASPECTS is 10. 3. 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 These results were communicated to Dr. Thomasena Edis at 10:30 pmon 12/11/2021by text page via the [facility] messaging system. Electronically Signed   By: Rise Mu M.D.   On: 04/12/2020 22:32   VAS US CAROTID  Result Date: 04/14/2020 Carotid Arterial Duplex Study Indications:       TIA. Risk Factors:      Hypertension. Comparison Study:  No prior studies. Performing Technologist: Jean Rosenthal RDMS  Examination Guidelines: A complete evaluation includes  B-mode imaging, spectral Doppler, color Doppler, and power Doppler as needed of all accessible portions of each vessel. Bilateral testing is considered an integral part of a complete examination. Limited examinations for reoccurring indications may be performed as noted.  Right Carotid Findings: +----------+--------+--------+--------+-----------------------+--------+           PSV cm/sEDV cm/sStenosisPlaque Description     Comments +----------+--------+--------+--------+-----------------------+--------+ CCA Prox  123     22                                              +----------+--------+--------+--------+-----------------------+--------+  CCA Distal97      24                                              +----------+--------+--------+--------+-----------------------+--------+ ICA Prox  265     99      60-79%  heterogenous and smooth         +----------+--------+--------+--------+-----------------------+--------+ ICA Mid   287     101     60-79%                                  +----------+--------+--------+--------+-----------------------+--------+ ICA Distal206     59                                              +----------+--------+--------+--------+-----------------------+--------+ ECA       134                                                     +----------+--------+--------+--------+-----------------------+--------+ +----------+--------+-------+----------------+-------------------+           PSV cm/sEDV cmsDescribe        Arm Pressure (mmHG) +----------+--------+-------+----------------+-------------------+ [id number]            Multiphasic, WNL                    +----------+--------+-------+----------------+-------------------+ +---------+--------+--+--------+--+---------+ VertebralPSV cm/s91EDV cm/s14Antegrade +---------+--------+--+--------+--+---------+  Left Carotid Findings:  +----------+--------+--------+--------+------------------+--------+           PSV cm/sEDV cm/sStenosisPlaque DescriptionComments +----------+--------+--------+--------+------------------+--------+ CCA Prox  132     30                                         +----------+--------+--------+--------+------------------+--------+ CCA Distal118     34                                         +----------+--------+--------+--------+------------------+--------+ ICA Prox  92      24      1-39%                              +----------+--------+--------+--------+------------------+--------+ ICA Distal194     57                                tortuous +----------+--------+--------+--------+------------------+--------+ ECA       122                                                +----------+--------+--------+--------+------------------+--------+ +----------+--------+--------+----------------+-------------------+           PSV cm/sEDV cm/sDescribe        Arm Pressure (mmHG) +----------+--------+--------+----------------+-------------------+ [id number]  Multiphasic, WNL                    +----------+--------+--------+----------------+-------------------+ +---------+--------+---+--------+--+---------+ VertebralPSV cm/s108EDV cm/s30Antegrade +---------+--------+---+--------+--+---------+   Summary: Right Carotid: 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 Left Carotid: Velocities in the left ICA are consistent with a 1-39% stenosis. Vertebrals:  Bilateral vertebral arteries demonstrate antegrade flow. Subclavians: Normal flow hemodynamics were seen in bilateral subclavian              arteries. *See table(s) above for measurements and observations.     Preliminary     Labs: BMET Recent Labs  Lab 04/11/20 1250 04/12/20 0456 04/13/20 0411 04/14/20 0551  NA 134* 139 144 146*  K 4.4 4.0 3.7 3.0*  CL 105 115* 120*  108  CO2 10* 10* 13* 25  GLUCOSE 124* 121* 121* 116*  BUN 105* 95* 75* 51*  CREATININE 10.72* 8.41* 4.32* 2.06*  CALCIUM 8.7* 8.0* 8.1* 7.7*   CBC Recent Labs  Lab 04/11/20 1250 04/12/20 0456  WBC 9.4 6.2  NEUTROABS 5.5  --   HGB 15.9* 12.4  HCT 45.3 34.4*  MCV 88.5 87.5  PLT 231 118*    Medications:    . aspirin  325 mg Oral Daily  . atorvastatin  40 mg Oral Daily  . carbamazepine  100 mg Oral BID  . Chlorhexidine Gluconate Cloth  6 each Topical Daily  . clopidogrel  75 mg Oral Daily  . docusate sodium  100 mg Oral BID  . gabapentin  100 mg Oral TID  . guaiFENesin  600 mg Oral BID  . heparin  5,000 Units Subcutaneous Q8H   Zetta Bills, MD 04/14/2020, 3:19 PM

## 2020-04-14 NOTE — TOC Initial Note (Signed)
Transition of Care [facility]) - Initial/Assessment Note    Patient Details  Name: Vicki Swanson MRN: [id number] Date of Birth: 03/09/1946  Transition of Care [facility] CM/SW Contact:    Carley Hammed, LCSWA Phone Number: 04/14/2020, 2:04 PM  Clinical Narrative:                 CSW noted that patient is disoriented, so spoke with spouse about SNF placement. He is agreeable and has no preference, but would like to stay in [place] or [place]/ [place] area. He noted that pt does not have covid vaccines and he was informed about quarantine policies, he verbalized understanding. CSW will do pt workup, faxout, and start ins. Auth. SW will continue to follow for DC planning.  Expected Discharge Plan: Skilled Nursing Facility Barriers to Discharge: Continued Medical Work up,SNF Pending bed offer,Insurance Authorization   Patient Goals and CMS Choice Patient states their goals for this hospitalization and ongoing recovery are:: Pt spouse agreeable to SNF placement. CMS Medicare.gov Compare Post Acute Care list provided to:: Patient Represenative (must comment) Choice offered to / list presented to : Spouse  Expected Discharge Plan and Services Expected Discharge Plan: Skilled Nursing Facility       Living arrangements for the past 2 months: Single Family Home                                      Prior Living Arrangements/Services Living arrangements for the past 2 months: Single Family Home Lives with:: Spouse Patient language and need for interpreter reviewed:: Yes Do you feel safe going back to the place where you live?: Yes      Need for Family Participation in Patient Care: Yes (Comment) Care giver support system in place?: Yes (comment)   Criminal Activity/Legal Involvement Pertinent to Current Situation/Hospitalization: No - Comment as needed  Activities of Daily Living Home Assistive Devices/Equipment: None ADL Screening (condition at time of admission) Patient's  cognitive ability adequate to safely complete daily activities?: No Is the patient deaf or have difficulty hearing?: No Does the patient have difficulty seeing, even when wearing glasses/contacts?: No Does the patient have difficulty concentrating, remembering, or making decisions?: Yes Patient able to express need for assistance with ADLs?: Yes Does the patient have difficulty dressing or bathing?: Yes Independently performs ADLs?: No Communication: Needs assistance Dressing (OT): Needs assistance Is this a change from baseline?: Change from baseline, expected to last >3 days Grooming: Needs assistance Is this a change from baseline?: Change from baseline, expected to last >3 days Feeding: Independent Bathing: Needs assistance Is this a change from baseline?: Change from baseline, expected to last >3 days Toileting: Needs assistance Is this a change from baseline?: Change from baseline, expected to last >3days In/Out Bed: Needs assistance Is this a change from baseline?: Change from baseline, expected to last >3 days Walks in Home: Needs assistance Is this a change from baseline?: Change from baseline, expected to last >3 days Does the patient have difficulty walking or climbing stairs?: Yes Weakness of Legs: Both Weakness of Arms/Hands: Right  Permission Sought/Granted Permission sought to share information with : Family Supports Permission granted to share information with : Yes, Verbal Permission Granted  Share Information with NAME: Errika Narvaiz     Permission granted to share info w Relationship: Spouse  Permission granted to share info w Contact Information: [phone]  Emotional Assessment Appearance:: Other (Comment Required (Unable  to Assess) Attitude/Demeanor/Rapport: Unable to Assess Affect (typically observed): Unable to Assess Orientation: : Oriented to Self,Oriented to Place Alcohol / Substance Use: Not Applicable Psych Involvement: No (comment)  Admission  diagnosis:  Encephalopathy acute [G93.40] Acute kidney injury (HCC) [N17.9] Influenza [J11.1] Patient Active Problem List   Diagnosis Date Noted  . Influenza 04/11/2020  . Acute lower UTI 04/11/2020  . COPD (chronic obstructive pulmonary disease) (HCC) 04/11/2020  . GERD (gastroesophageal reflux disease) 04/11/2020  . Essential hypertension 04/11/2020  . AKI (acute kidney injury) (HCC) 04/11/2020  . Atrial fibrillation (HCC) 10/02/2019   PCP:  Pcp, No Pharmacy:   [id number] - [place], [place] - [address] AT [facility] [address] [place] [address] Phone: [phone] Fax: [phone]     Social Determinants of Health (SDOH) Interventions    Readmission Risk Interventions No flowsheet data found.

## 2020-04-14 NOTE — Plan of Care (Signed)
  Problem: Education: Goal: Knowledge of secondary prevention will improve Outcome: Progressing   

## 2020-04-14 NOTE — [place] FL2 (Signed)
[place] MEDICAID FL2 LEVEL OF CARE SCREENING TOOL     IDENTIFICATION  Patient Name: Vicki Swanson Birthdate: 1945-06-28 Sex: female Admission Date (Current Location): 04/11/2020  [facility] and [place] Number:  Producer, television/film/video and Address:  [facility], [address], [place], [place] [address]      Provider Number: [id number]  Attending Physician Name and Address:  Joycelyn Das, MD  Relative Name and Phone Number:  Rena Hunke [phone]    Current Level of Care: Hospital Recommended Level of Care: Skilled Nursing Facility Prior Approval Number:    Date Approved/Denied:   PASRR Number: [id number]  Discharge Plan: SNF    Current Diagnoses: Patient Active Problem List   Diagnosis Date Noted  . Influenza 04/11/2020  . Acute lower UTI 04/11/2020  . COPD (chronic obstructive pulmonary disease) (HCC) 04/11/2020  . GERD (gastroesophageal reflux disease) 04/11/2020  . Essential hypertension 04/11/2020  . AKI (acute kidney injury) (HCC) 04/11/2020  . Atrial fibrillation (HCC) 10/02/2019    Orientation RESPIRATION BLADDER Height & Weight     Self,Place  Normal Indwelling catheter Weight: 153 lb 10.6 oz (69.7 kg) Height:  5' (152.4 cm)  BEHAVIORAL SYMPTOMS/MOOD NEUROLOGICAL BOWEL NUTRITION STATUS      Continent Diet (See DC Summary)  AMBULATORY STATUS COMMUNICATION OF NEEDS Skin   Extensive Assist Verbally Normal                       Personal Care Assistance Level of Assistance  Bathing,Feeding,Dressing Bathing Assistance: Maximum assistance Feeding assistance:  (NPO, Unable to Assess) Dressing Assistance: Maximum assistance     Functional Limitations Info  Sight,Hearing,Speech Sight Info: Adequate Hearing Info: Adequate Speech Info: Adequate    SPECIAL CARE FACTORS FREQUENCY  PT (By licensed PT),OT (By licensed OT),Speech therapy     PT Frequency: 5x week OT Frequency: 5x week     Speech Therapy Frequency: 5x  week      Contractures Contractures Info: Not present    Additional Factors Info  Code Status,Allergies Code Status Info: Full Allergies Info: NKA           Current Medications (04/14/2020):  This is the current hospital active medication list Current Facility-Administered Medications  Medication Dose Route Frequency Provider Last Rate Last Admin  . 0.9 %  sodium chloride infusion   Intravenous PRN Cheryll Cockayne, MD 20 mL/hr at 04/11/20 1315 New Bag at 04/11/20 1315  . 0.9 %  sodium chloride infusion   Intravenous Continuous Pokhrel, Laxman, MD 75 mL/hr at 04/14/20 0921 New Bag at 04/14/20 0921  . acetaminophen (TYLENOL) tablet 650 mg  650 mg Oral Q6H PRN Rometta Emery, MD   650 mg at 04/14/20 0543   Or  . acetaminophen (TYLENOL) suppository 650 mg  650 mg Rectal Q6H PRN Rometta Emery, MD   650 mg at 04/13/20 0121  . albuterol (PROVENTIL) (2.5 MG/3ML) 0.083% nebulizer solution 2.5 mg  2.5 mg Nebulization Q2H PRN Earlie Lou L, MD      . aspirin tablet 325 mg  325 mg Oral Daily Marzetta Board, MD   325 mg at 04/14/20 0930  . atorvastatin (LIPITOR) tablet 40 mg  40 mg Oral Daily Marvel Plan, MD   40 mg at 04/14/20 0930  . carbamazepine (TEGRETOL XR) 12 hr tablet 100 mg  100 mg Oral BID Metzger-Cihelka, Desiree, NP   100 mg at 04/14/20 0929  . Chlorhexidine Gluconate Cloth 2 % PADS 6  each  6 each Topical Daily Rometta Emery, MD   6 each at 04/14/20 0930  . clopidogrel (PLAVIX) tablet 75 mg  75 mg Oral Daily Marvel Plan, MD   75 mg at 04/14/20 0930  . docusate sodium (COLACE) capsule 100 mg  100 mg Oral BID Earlie Lou L, MD   100 mg at 04/12/20 1101  . gabapentin (NEURONTIN) capsule 100 mg  100 mg Oral TID Metzger-Cihelka, Desiree, NP   100 mg at 04/14/20 0930  . guaiFENesin (MUCINEX) 12 hr tablet 600 mg  600 mg Oral BID Earlie Lou L, MD   600 mg at 04/14/20 0929  . heparin injection 5,000 Units  5,000 Units Subcutaneous Q8H Rometta Emery, MD    5,000 Units at 04/14/20 0543  . hydrALAZINE (APRESOLINE) injection 5 mg  5 mg Intravenous Q4H PRN John Giovanni, MD   5 mg at 04/13/20 1631  . ondansetron (ZOFRAN) tablet 4 mg  4 mg Oral Q6H PRN Rometta Emery, MD       Or  . ondansetron (ZOFRAN) injection 4 mg  4 mg Intravenous Q6H PRN Rometta Emery, MD   4 mg at 04/14/20 1233     Discharge Medications: Please see discharge summary for a list of discharge medications.  Relevant Imaging Results:  Relevant Lab Results:   Additional Information SS# [id number]  Carley Hammed, LCSWA

## 2020-04-14 NOTE — Progress Notes (Signed)
Echocardiogram 2D Echocardiogram has been performed.  Vicki Swanson [name] 04/14/2020, 10:33 AM

## 2020-04-14 NOTE — Progress Notes (Addendum)
PROGRESS NOTE    Vicki Swanson  ZOX:[id number] DOB: 26-May-1945 DOA: 04/11/2020 PCP: Pcp, No   Brief Narrative:   Vicki Swanson a 74 y.o.femalewith medical history significant ofhypertension, COPD, GERD who presented to med [facility] with complaints ofdiarrhea fever, weakness and fatigue for 4 to 5 days prior to presentation with decreased oral intake. In the ED patient was mildly hypotensive.  Viral screen was positive for influenza A.  Urinalysis showed moderate leukocytosis but no nitrites.  Initial chemistry was notable for creatinine of 10.7 and BUN of 105.. Troponin of 4 lactic acid 1.0. CBC entirely within normal. Head CT without contrast was negative. Chest x-ray showed no active finding. Patient was then admitted to hospital for acute influenza.  Assessment & Plan:   Principal Problem:   Influenza Active Problems:   Acute lower UTI   COPD (chronic obstructive pulmonary disease) (HCC)   GERD (gastroesophageal reflux disease)   Essential hypertension   AKI (acute kidney injury) (HCC) Diarrhea  acute influenza A infection: Continue IV hydration Tamiflu day 4/5. Improving renal function.  Still with impaired appetite.  Blood culture with no growth in 3 days.  WBC at 6.2.  Questionable TIA.   Head CT scan was negative.  MRI of the head/MRA showed severe intracranial atherosclerotic changes.  Continue aspirin.  MRI of the brain with age-related cerebral atrophy.  Neurology was consulted.  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  Acute kidney injury:No prior history of chronic kidney disease.  Thought to be secondary to volume depletion from nausea vomiting, diarrhea and poor oral intake with the use of ACE inhibitor's at home.  Continue IV hydration.  Creatinine improving to 2.0 from 10 on presentation.  Continue to hold lisinopril.  Hemoglobin A1c of 4.9.  Lab Results  Component Value Date   CREATININE 2.06 (H) 04/14/2020   CREATININE  4.32 (H) 04/13/2020   CREATININE 8.41 (H) 04/12/2020   COPD:Continue inhalers.  No acute exacerbation at this time.  GERD:Continue PPI  essential hypertension:Initially hypotensive.  Antihypertensives on hold.  On propranolol at home.  Hypokalemia.  Will replace orally.  Check levels in a.m.  Metabolic encephalopathy secondary to influenza acute kidney injury Will monitor.  Improving.  Hold sertraline, topiramate and gabapentin at this time.  Ammonia was 23.  TSH 0.6.  Vitamin B12 at  547.  history of atrial fibrillation: On propranolol at home.  On hold at this time.  Abnormal urinalysis.  Urine culture with 10,000 colony of Enterococcus.  No evidence of UTI. Dc antibiotics.   DVT prophylaxis: heparin injection 5,000 Units Start: 04/12/20 0145  Code Status: Full code  Family Communication: Spoke with the patient's husband at bedside  Status is: Inpatient  Remains inpatient appropriate because:IV treatments appropriate due to intensity of illness or inability to take PO and Inpatient level of care appropriate due to severity of illness  Dispo: The patient is from: Home              Anticipated d/c is to: Skilled nursing facility as per PT.  Husband agreeable              Anticipated d/c date is: 2 days              Patient currently is not medically stable to d/c.  Consult: Neurology  Procedures:  DG Abd 1 View  Result Date: 04/13/2020 CLINICAL DATA:  Metal implant.  MRI. EXAM: ABDOMEN - 1 VIEW COMPARISON:  None. FINDINGS: There  are metallic clips in the right upper quadrant likely related to prior cholecystectomy. These are not a contraindication to MRI. 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 The bowel gas pattern is nonobstructive. IMPRESSION: Metallic clips in the right upper quadrant likely related to prior cholecystectomy. These are not a contraindication to MRI. Nonobstructive  bowel gas pattern. Electronically Signed   By: Katherine Mantle M.D.   On: 04/13/2020 00:03   CT HEAD WO CONTRAST  Result Date: 04/11/2020 CLINICAL DATA:  Confusion for a week. Diarrhea last weekend. Weakness. EXAM: CT HEAD WITHOUT CONTRAST TECHNIQUE: Contiguous axial images were obtained from the base of the skull through the vertex without intravenous contrast. COMPARISON:  Head CT, 06/07/2016 FINDINGS: Brain: No evidence of acute infarction, hemorrhage, hydrocephalus, extra-axial collection or mass lesion/mass effect. Small area of lucency in the right basal ganglia consistent with a old lacunar infarct. Minor areas of white matter hypoattenuation are noted bilaterally consistent with chronic microvascular ischemic change. Vascular: No hyperdense vessel or unexpected calcification. Skull: Normal. Negative for fracture or focal lesion. Sinuses/Orbits: Globes and orbits are unremarkable. Visualized sinuses are clear. Other: None. IMPRESSION: 1. No acute intracranial abnormalities. Electronically Signed   By: Amie Portland M.D.   On: 04/11/2020 13:49   MR ANGIO HEAD WO CONTRAST  Result Date: 04/13/2020 CLINICAL DATA:  74 year old female code stroke presentation yesterday. No acute finding on MRI. EXAM: MRA HEAD WITHOUT CONTRAST TECHNIQUE: Angiographic images of the Circle of Willis were obtained using MRA technique without intravenous contrast. COMPARISON:  Brain MRI 0004 hours today and earlier. FINDINGS: No intracranial mass effect or ventriculomegaly. Antegrade flow in the posterior circulation. Dominant appearing distal left vertebral artery and moderate to severe stenosis of the right vertebral V4 segment (series 1055, image 9). The right AICA appears to be dominant and is patent. Mild irregularity and stenosis of the left V4 segment just upstream of the normal left PICA origin. Patent vertebrobasilar junction. Mild proximal basilar artery stenosis. More moderate irregularity and stenosis of the  basilar tip, but the SCA and PCA origins remain patent. Superimposed moderate to severe left and severe short segment right PCA P1 stenoses. Bilateral PCA branches remain patent although are mildly irregular, more so the left. Antegrade flow in both ICA siphons with artifacts suspected on the lowest image. Both siphons are patent although irregular and there is evidence of severe left siphon stenosis in the cavernous segment (flow gap which also corresponds to an area of flow void irregularity on the earlier MRI). On the right side sign some only mild siphon stenosis is suspected. Both carotid termini remain patent. MCA and ACA origins are within normal limits. Visible ACA branches are within normal limits. MCA M1 segments and MCA bi/trifurcations are within normal limits. Visible bilateral MCA branches are mildly irregular. IMPRESSION: 1. Evidence of severe intracranial atherosclerosis, most notably: - Severe stenosis Left ICA cavernous segment. - up to Moderate stenosis at the Basilar tip, with moderate to Severe bilateral PCA P1 stenoses. - Moderate to severe Right Vertebral V4 segment stenosis. 2. Additional mild stenoses including distal Left vertebral artery, proximal Basilar artery, Right siphon. 3. Neurointerventional Radiology consultation would be valuable. Electronically Signed   By: Odessa Fleming M.D.   On: 04/13/2020 13:48   MR BRAIN WO CONTRAST  Result Date: 04/13/2020 CLINICAL DATA:  Initial evaluation for neuro deficit, stroke suspected. EXAM: MRI HEAD WITHOUT CONTRAST TECHNIQUE: Multiplanar, multiecho pulse sequences of the brain  and surrounding structures were obtained without intravenous contrast. COMPARISON:  Prior CT from 04/12/2020. FINDINGS: Brain: Diffuse prominence of the CSF containing spaces compatible with generalized age-related cerebral atrophy. Mild scattered patchy T2/FLAIR hyperintensity within the periventricular and deep white matter both cerebral hemispheres as well as the pons,  most likely related chronic microvascular ischemic disease, mild for age. 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 No abnormal foci of restricted diffusion to suggest acute or subacute ischemia. Gray-white matter differentiation maintained. No encephalomalacia to suggest chronic cortical infarction. No foci of susceptibility artifact to suggest acute or chronic intracranial hemorrhage. No mass lesion, midline shift or mass effect. No hydrocephalus or extra-axial fluid collection. Pituitary gland suprasellar region normal. Midline structures intact. Vascular: Major intracranial vascular flow voids are maintained. Skull and upper cervical spine: Craniocervical junction within normal limits. Bone marrow signal intensity normal. No scalp soft tissue abnormality. Sinuses/Orbits: Patient status post bilateral ocular lens replacement. Globes and orbital soft tissues demonstrate no acute finding. Mild scattered mucosal thickening noted within the ethmoidal air cells, sphenoid sinuses, and maxillary sinuses. Air-fluid level within the left sphenoid sinus suggestive of acute sinusitis. No significant mastoid effusion. Inner ear structures normal. Other: None. IMPRESSION: 1. No acute intracranial abnormality. 2. Age-related cerebral atrophy with mild chronic small vessel ischemic disease. 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 3. Air-fluid level within the left sphenoid sinus suggestive of acute sinusitis. Electronically Signed   By: Rise Mu M.D.   On: 04/13/2020 00:30   US RENAL  Result Date: 04/12/2020 CLINICAL DATA:  Acute renal failure EXAM: RENAL / URINARY TRACT ULTRASOUND COMPLETE COMPARISON:  None. FINDINGS: Right Kidney: Renal measurements: 9.1 x 4.6 x 4.7 cm = volume: 102 mL. Echogenicity appears within normal limits. There is mild right hydronephrosis. There is a 0.3 cm echogenic focus which is nonspecific but possibly represents a  small stone. No renal mass. Left Kidney: Renal measurements: 8.8 x 4.7 x 4.2 cm = volume: 90 mL. Echogenicity appears within normal limits. No hydronephrosis. No renal calculus. No renal mass identified. Bladder: Decompressed with Foley catheter in place. Other: None. IMPRESSION: 1. Mild right kidney hydronephrosis. There is a 0.3 cm echogenic focus in the right kidney which could represent a small stone. 2.  Unremarkable appearance of the left kidney. Electronically Signed   By: Emmaline Kluver M.D.   On: 04/12/2020 11:04   DG Chest Port 1 View  Result Date: 04/11/2020 CLINICAL DATA:  Questionable sepsis EXAM: PORTABLE CHEST 1 VIEW COMPARISON:  None. FINDINGS: The lungs are clear and negative for focal airspace consolidation, pulmonary edema or suspicious pulmonary nodule. No pleural effusion or pneumothorax. Cardiac and mediastinal contours are within normal limits. No acute fracture or lytic or blastic osseous lesions. The visualized upper abdominal bowel gas pattern is unremarkable. IMPRESSION: No active disease. Electronically Signed   By: Malachy Moan M.D.   On: 04/11/2020 13:48   CT HEAD CODE STROKE WO CONTRAST  Result Date: 04/12/2020 CLINICAL DATA:  Code stroke. Initial evaluation for acute facial droop, aphasia. Of EXAM: CT HEAD WITHOUT CONTRAST TECHNIQUE: Contiguous axial images were obtained from the base of the skull through the vertex without intravenous contrast. COMPARISON:  None. FINDINGS: Brain: Cerebral volume within normal limits for age. Mild chronic microvascular ischemic disease noted within the periventricular white matter. 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 No acute intracranial hemorrhage. No acute large vessel territory infarct. No mass lesion, midline shift or mass  effect. No hydrocephalus or extra-axial fluid collection. Vascular: No hyperdense vessel. Mild calcified atherosclerosis at the skull base. Skull: Scalp soft tissues and  calvarium within normal limits. Sinuses/Orbits: Globes and orbital soft tissues are normal. Mild scattered mucosal thickening noted within the paranasal sinuses, with small volume opacity within the left sphenoid sinus. Mastoid air cells and middle ear cavities are clear. Other: None. ASPECTS [facility] Stroke Program Early CT Score) - Ganglionic level infarction (caudate, lentiform nuclei, internal capsule, insula, M1-M3 cortex): 7 - Supraganglionic infarction (M4-M6 cortex): 3 Total score (0-10 with 10 being normal): 10 IMPRESSION: 1. No acute intracranial abnormality. 2. ASPECTS is 10. 3. 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 These results were communicated to Dr. Thomasena Edis at 10:30 pmon 12/11/2021by text page via the [facility] messaging system. Electronically Signed   By: Rise Mu M.D.   On: 04/12/2020 22:32    Antimicrobials:   Rocephin IV   Subjective: Today, patient was seen and examined at bedside.  Husband at bedside.  Patient feels sleepy.  She was however alert awake and communicative.  Denied any chest pain fever or shortness of breath has mild cough.  Has improved appetite.  No nausea vomiting or diarrhea today..  Objective: Vitals:   04/13/20 1639 04/13/20 2000 04/14/20 0000 04/14/20 0417  BP: (!) 170/61 (!) 147/85 (!) 159/73 (!) 169/64  Pulse: 75 76 80 79  Resp:  Temp:  (!) 97.5 F (36.4 C) 97.8 F (36.6 C) 98.9 F (37.2 C)  TempSrc:  Oral Oral Oral  SpO2: 99% 100% 98% 98%  Weight:      Height:        Intake/Output Summary (Last 24 hours) at 04/14/2020 0745 Last data filed at 04/14/2020 0600 Gross per 24 hour  Intake 2060 ml  Output 1400 ml  Net 660 ml   Filed Weights   04/11/20 1259 04/12/20 0449 04/12/20 2112  Weight: 65.3 kg 69.7 kg 69.7 kg   Body mass index is 30.01 kg/m.   Physical examination: General:  Average built, not in obvious distress, alert awake  communicative HENT:   No scleral pallor or icterus noted. Oral mucosa is moist.  Chest:   Diminished breath sounds bilaterally. No crackles or wheezes.  CVS: S1 &S2 heard. No murmur.  Regular rate and rhythm. Abdomen: Soft, nontender, nondistended.  Bowel sounds are heard.   Extremities: No cyanosis, clubbing or edema.  Peripheral pulses are palpable. Psych: Alert, awake and communicative, normal mood CNS:  No cranial nerve deficits.  Power equal in all extremities.  Generalized weakness noted Skin: Warm and dry.  No rashes noted.   Data Reviewed: I have personally reviewed following labs and imaging studies  CBC: Recent Labs  Lab 04/11/20 1250 04/12/20 0456  WBC 9.4 6.2  NEUTROABS 5.5  --   HGB 15.9* 12.4  HCT 45.3 34.4*  MCV 88.5 87.5  PLT 231 118*   Basic Metabolic Panel: Recent Labs  Lab 04/11/20 1250 04/12/20 0456 04/13/20 0411 04/14/20 0551  NA 134* 139 144 146*  K 4.4 4.0 3.7 3.0*  CL 105 115* 120* 108  CO2 10* 10* 13* 25  GLUCOSE 124* 121* 121* 116*  BUN 105* 95* 75* 51*  CREATININE 10.72* 8.41* 4.32* 2.06*  CALCIUM 8.7* 8.0* 8.1* 7.7*   GFR: Estimated Creatinine Clearance: 20.6 mL/min (A) (by C-G formula based on SCr of 2.06 mg/dL (H)). Liver Function Tests: Recent Labs  Lab 04/11/20  1250 04/12/20 0456  AST 11* 11*  ALT 11 9  ALKPHOS 98 68  BILITOT 0.7 0.4  PROT 7.9 5.6*  ALBUMIN 4.3 3.0*   No results for input(s): LIPASE, AMYLASE in the last 168 hours. Recent Labs  Lab 04/12/20 2307  AMMONIA 23   Coagulation Profile: Recent Labs  Lab 04/11/20 1250  INR 1.1   Cardiac Enzymes: No results for input(s): CKTOTAL, CKMB, CKMBINDEX, TROPONINI in the last 168 hours. BNP (last 3 results) No results for input(s): PROBNP in the last 8760 hours. HbA1C: Recent Labs    04/13/20 1023  HGBA1C 4.9   CBG: Recent Labs  Lab 04/11/20 1249 04/12/20 2143  GLUCAP 110* 126*   Lipid Profile: Recent Labs    04/13/20 1023  CHOL 143  HDL 24*   LDLCALC 87  TRIG 159*  CHOLHDL 6.0   Thyroid Function Tests: Recent Labs    04/12/20 2307  TSH 0.601   Anemia Panel: Recent Labs    04/12/20 2307  VITAMINB12 547   Sepsis Labs: Recent Labs  Lab 04/11/20 1250  LATICACIDVEN 1.0    Recent Results (from the past 240 hour(s))  Blood culture (routine x 2)     Status: None (Preliminary result)   Collection Time: 04/11/20 12:50 PM   Specimen: BLOOD  Result Value Ref Range Status   Specimen Description   Final    BLOOD RIGHT ANTECUBITAL Performed at [facility] [place], [address] [place], [place] [address]    Special Requests   Final    BOTTLES DRAWN AEROBIC AND ANAEROBIC Blood Culture adequate volume Performed at [facility] [place], [address] [place], [place] [address]    Culture   Final    NO GROWTH 2 DAYS Performed at [facility], [address] [place], [place] [address]    Report Status PENDING  Incomplete  Blood culture (routine x 2)     Status: None (Preliminary result)   Collection Time: 04/11/20  1:11 PM   Specimen: BLOOD  Result Value Ref Range Status   Specimen Description   Final    BLOOD LEFT ANTECUBITAL Performed at [facility] [place], [address] [place], [place] [address]    Special Requests   Final    BOTTLES DRAWN AEROBIC AND ANAEROBIC Blood Culture adequate volume Performed at [facility] [place], [address] [place], [place] [address]    Culture   Final    NO GROWTH 2 DAYS Performed at [facility], [address] [place], [place] [address]    Report Status PENDING  Incomplete  Resp Panel by RT-PCR (Flu A&B, Covid) Nasopharyngeal Swab     Status: Abnormal   Collection Time: 04/11/20  1:12 PM   Specimen: Nasopharyngeal Swab; Nasopharyngeal(NP) swabs in vial transport medium  Result Value Ref Range Status   SARS Coronavirus 2 by RT PCR NEGATIVE NEGATIVE Final    Comment: (NOTE) SARS-CoV-2 target nucleic acids are NOT DETECTED.  The  SARS-CoV-2 RNA is generally detectable in upper respiratory specimens during the acute phase of infection. The lowest concentration of SARS-CoV-2 viral copies this assay can detect is 138 copies/mL. A negative result does not preclude SARS-Cov-2 infection and should not be used as the sole basis for treatment or other patient management decisions. A negative result may occur with  improper specimen collection/handling, submission of specimen other than nasopharyngeal swab, presence of viral mutation(s) within the areas targeted by this  assay, and inadequate number of viral copies(<138 copies/mL). A negative result must be combined with clinical observations, patient history, and epidemiological information. The expected result is Negative.  Fact Sheet for Patients:  BloggerCourse.com  Fact Sheet for Healthcare Providers:  SeriousBroker.it  This test is no t yet approved or cleared by the [place] FDA and  has been authorized for detection and/or diagnosis of SARS-CoV-2 by FDA under an Emergency Use Authorization (EUA). This EUA will remain  in effect (meaning this test can be used) for the duration of the COVID-19 declaration under Section 564(b)(1) of the Act, 21 U.S.C.section 360bbb-3(b)(1), unless the authorization is terminated  or revoked sooner.       Influenza A by PCR POSITIVE (A) NEGATIVE Final   Influenza B by PCR NEGATIVE NEGATIVE Final    Comment: (NOTE) The Xpert Xpress SARS-CoV-2/FLU/RSV plus assay is intended as an aid in the diagnosis of influenza from Nasopharyngeal swab specimens and should not be used as a sole basis for treatment. Nasal washings and aspirates are unacceptable for Xpert Xpress SARS-CoV-2/FLU/RSV testing.  Fact Sheet for Patients: BloggerCourse.com  Fact Sheet for Healthcare Providers: SeriousBroker.it  This test is not yet approved or  cleared by the [place] FDA and has been authorized for detection and/or diagnosis of SARS-CoV-2 by FDA under an Emergency Use Authorization (EUA). This EUA will remain in effect (meaning this test can be used) for the duration of the COVID-19 declaration under Section 564(b)(1) of the Act, 21 U.S.C. section 360bbb-3(b)(1), unless the authorization is terminated or revoked.  Performed at [facility], [address] [place], [place] [address]   Urine culture     Status: Abnormal (Preliminary result)   Collection Time: 04/11/20  5:00 PM   Specimen: In/Out Cath Urine  Result Value Ref Range Status   Specimen Description   Final    IN/OUT CATH URINE Performed at [facility], [address] [place], [place] [address]    Special Requests   Final    NONE Performed at [facility], [address] [place], [place] [address]    Culture (A)  Final    10,000 COLONIES/mL ENTEROCOCCUS FAECALIS SUSCEPTIBILITIES TO FOLLOW Performed at [facility], [address] [place], [place] [address]    Report Status PENDING  Incomplete  Culture, Urine     Status: None   Collection Time: 04/12/20 10:21 AM   Specimen: Urine, Random  Result Value Ref Range Status   Specimen Description URINE, RANDOM  Final   Special Requests NONE  Final   Culture   Final    NO GROWTH Performed at [facility], [address], [place], [place] [address]    Report Status 04/13/2020 FINAL  Final         Radiology Studies: DG Abd 1 View  Result Date: 04/13/2020 CLINICAL DATA:  Metal implant.  MRI. EXAM: ABDOMEN - 1 VIEW COMPARISON:  None. FINDINGS: There are metallic clips in the right upper quadrant likely related to prior cholecystectomy. These are not a contraindication to MRI. 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 The bowel gas pattern is nonobstructive. IMPRESSION:  Metallic clips in the right upper quadrant likely related to prior cholecystectomy. These are not a contraindication to MRI. Nonobstructive bowel gas pattern. Electronically Signed   By: Katherine Mantle M.D.   On: 04/13/2020 00:03  MR ANGIO HEAD WO CONTRAST  Result Date: 04/13/2020 CLINICAL DATA:  74 year old female code stroke presentation yesterday. No acute finding on MRI. EXAM: MRA HEAD WITHOUT CONTRAST TECHNIQUE: Angiographic images of the Circle of Willis were obtained using MRA technique without intravenous contrast. COMPARISON:  Brain MRI 0004 hours today and earlier. FINDINGS: No intracranial mass effect or ventriculomegaly. Antegrade flow in the posterior circulation. Dominant appearing distal left vertebral artery and moderate to severe stenosis of the right vertebral V4 segment (series 1055, image 9). The right AICA appears to be dominant and is patent. Mild irregularity and stenosis of the left V4 segment just upstream of the normal left PICA origin. Patent vertebrobasilar junction. Mild proximal basilar artery stenosis. More moderate irregularity and stenosis of the basilar tip, but the SCA and PCA origins remain patent. Superimposed moderate to severe left and severe short segment right PCA P1 stenoses. Bilateral PCA branches remain patent although are mildly irregular, more so the left. Antegrade flow in both ICA siphons with artifacts suspected on the lowest image. Both siphons are patent although irregular and there is evidence of severe left siphon stenosis in the cavernous segment (flow gap which also corresponds to an area of flow void irregularity on the earlier MRI). On the right side sign some only mild siphon stenosis is suspected. Both carotid termini remain patent. MCA and ACA origins are within normal limits. Visible ACA branches are within normal limits. MCA M1 segments and MCA bi/trifurcations are within normal limits. Visible bilateral MCA branches are mildly irregular.  IMPRESSION: 1. Evidence of severe intracranial atherosclerosis, most notably: - Severe stenosis Left ICA cavernous segment. - up to Moderate stenosis at the Basilar tip, with moderate to Severe bilateral PCA P1 stenoses. - Moderate to severe Right Vertebral V4 segment stenosis. 2. Additional mild stenoses including distal Left vertebral artery, proximal Basilar artery, Right siphon. 3. Neurointerventional Radiology consultation would be valuable. Electronically Signed   By: Odessa Fleming M.D.   On: 04/13/2020 13:48   MR BRAIN WO CONTRAST  Result Date: 04/13/2020 CLINICAL DATA:  Initial evaluation for neuro deficit, stroke suspected. EXAM: MRI HEAD WITHOUT CONTRAST TECHNIQUE: Multiplanar, multiecho pulse sequences of the brain and surrounding structures were obtained without intravenous contrast. COMPARISON:  Prior CT from 04/12/2020. FINDINGS: Brain: Diffuse prominence of the CSF containing spaces compatible with generalized age-related cerebral atrophy. Mild scattered patchy T2/FLAIR hyperintensity within the periventricular and deep white matter both cerebral hemispheres as well as the pons, most likely related chronic microvascular ischemic disease, mild for age. 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 No abnormal foci of restricted diffusion to suggest acute or subacute ischemia. Gray-white matter differentiation maintained. No encephalomalacia to suggest chronic cortical infarction. No foci of susceptibility artifact to suggest acute or chronic intracranial hemorrhage. No mass lesion, midline shift or mass effect. No hydrocephalus or extra-axial fluid collection. Pituitary gland suprasellar region normal. Midline structures intact. Vascular: Major intracranial vascular flow voids are maintained. Skull and upper cervical spine: Craniocervical junction within normal limits. Bone marrow signal intensity normal. No scalp soft tissue abnormality. Sinuses/Orbits: Patient status post  bilateral ocular lens replacement. Globes and orbital soft tissues demonstrate no acute finding. Mild scattered mucosal thickening noted within the ethmoidal air cells, sphenoid sinuses, and maxillary sinuses. Air-fluid level within the left sphenoid sinus suggestive of acute sinusitis. No significant mastoid effusion. Inner ear structures normal. Other: None. IMPRESSION: 1. No acute intracranial abnormality. 2. Age-related cerebral atrophy with mild chronic small vessel ischemic disease. Superimposed small  remote lacunar infarct at the right lentiform nucleus. 3. Air-fluid level within the left sphenoid sinus suggestive of acute sinusitis. Electronically Signed   By: Rise Mu M.D.   On: 04/13/2020 00:30   US RENAL  Result Date: 04/12/2020 CLINICAL DATA:  Acute renal failure EXAM: RENAL / URINARY TRACT ULTRASOUND COMPLETE COMPARISON:  None. FINDINGS: Right Kidney: Renal measurements: 9.1 x 4.6 x 4.7 cm = volume: 102 mL. Echogenicity appears within normal limits. There is mild right hydronephrosis. There is a 0.3 cm echogenic focus which is nonspecific but possibly represents a small stone. No renal mass. Left Kidney: Renal measurements: 8.8 x 4.7 x 4.2 cm = volume: 90 mL. Echogenicity appears within normal limits. No hydronephrosis. No renal calculus. No renal mass identified. Bladder: Decompressed with Foley catheter in place. Other: None. IMPRESSION: 1. Mild right kidney hydronephrosis. There is a 0.3 cm echogenic focus in the right kidney which could represent a small stone. 2.  Unremarkable appearance of the left kidney. Electronically Signed   By: Emmaline Kluver M.D.   On: 04/12/2020 11:04   CT HEAD CODE STROKE WO CONTRAST  Result Date: 04/12/2020 CLINICAL DATA:  Code stroke. Initial evaluation for acute facial droop, aphasia. Of EXAM: CT HEAD WITHOUT CONTRAST TECHNIQUE: Contiguous axial images were obtained from the base of the skull through the vertex without intravenous contrast.  COMPARISON:  None. FINDINGS: Brain: Cerebral volume within normal limits for age. Mild chronic microvascular ischemic disease noted within the periventricular white matter. 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 No acute intracranial hemorrhage. No acute large vessel territory infarct. No mass lesion, midline shift or mass effect. No hydrocephalus or extra-axial fluid collection. Vascular: No hyperdense vessel. Mild calcified atherosclerosis at the skull base. Skull: Scalp soft tissues and calvarium within normal limits. Sinuses/Orbits: Globes and orbital soft tissues are normal. Mild scattered mucosal thickening noted within the paranasal sinuses, with small volume opacity within the left sphenoid sinus. Mastoid air cells and middle ear cavities are clear. Other: None. ASPECTS [facility] Stroke Program Early CT Score) - Ganglionic level infarction (caudate, lentiform nuclei, internal capsule, insula, M1-M3 cortex): 7 - Supraganglionic infarction (M4-M6 cortex): 3 Total score (0-10 with 10 being normal): 10 IMPRESSION: 1. No acute intracranial abnormality. 2. ASPECTS is 10. 3. 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 These results were communicated to Dr. Thomasena Edis at 10:30 pmon 12/11/2021by text page via the [facility] messaging system. Electronically Signed   By: Rise Mu M.D.   On: 04/12/2020 22:32      Scheduled Meds: . aspirin  325 mg Oral Daily  . atorvastatin  10 mg Oral Daily  . carbamazepine  100 mg Oral BID  . Chlorhexidine Gluconate Cloth  6 each Topical Daily  . docusate sodium  100 mg Oral BID  . gabapentin  100 mg Oral TID  . guaiFENesin  600 mg Oral BID  . heparin  5,000 Units Subcutaneous Q8H   Continuous Infusions: . sodium chloride 20 mL/hr at 04/11/20 1315  . cefTRIAXone (ROCEPHIN)  IV 1 g (04/14/20 0129)  .  sodium bicarbonate (isotonic) infusion in sterile water 125 mL/hr at  04/14/20 0029     LOS: 3 days    Joycelyn Das, MD [facility] 04/14/2020,

## 2020-04-14 NOTE — Progress Notes (Signed)
Carotid duplex bilateral study completed.   Please see CV Proc for preliminary results.   Stclair Szymborski, RDMS  

## 2020-04-14 NOTE — Progress Notes (Signed)
STROKE TEAM PROGRESS NOTE   INTERVAL HISTORY Her husband is at the bedside.  Patient sitting in chair, awake alert, orientated to people, place and months, but not to year.  No aphasia, facial symmetrical, moving all extremities symmetrically.  Stated that she had intermittent right scalp tenderness to touch with a tenderness point at the base of the skull. When I compressed the tenderness point, she felt tenderness, the location and pain resembles occipital neuralgia.  Vitals:   04/14/20 0000 04/14/20 0417 04/14/20 0829 04/14/20 1216  BP: (!) 159/73 (!) 169/64 (!) 162/94 (!) 173/98  Pulse: 80 79 (!) 101 67  Resp: 16 19 (!) 24 (!) 24  Temp: 97.8 F (36.6 C) 98.9 F (37.2 C) 98.5 F (36.9 C) 97.6 F (36.4 C)  TempSrc: Oral Oral Axillary Axillary  SpO2: 98% 98% 99% 99%  Weight:      Height:       CBC:  Recent Labs  Lab 04/11/20 1250 04/12/20 0456  WBC 9.4 6.2  NEUTROABS 5.5  --   HGB 15.9* 12.4  HCT 45.3 34.4*  MCV 88.5 87.5  PLT 231 118*   Basic Metabolic Panel:  Recent Labs  Lab 04/13/20 0411 04/14/20 0551  NA 144 146*  K 3.7 3.0*  CL 120* 108  CO2 13* 25  GLUCOSE 121* 116*  BUN 75* 51*  CREATININE 4.32* 2.06*  CALCIUM 8.1* 7.7*   Lipid Panel:  Recent Labs  Lab 04/13/20 1023  CHOL 143  TRIG 159*  HDL 24*  CHOLHDL 6.0  VLDL 32  LDLCALC 87   HgbA1c:  Recent Labs  Lab 04/13/20 1023  HGBA1C 4.9   Urine Drug Screen: No results for input(s): LABOPIA, COCAINSCRNUR, LABBENZ, AMPHETMU, THCU, LABBARB in the last 168 hours.  Alcohol Level No results for input(s): ETH in the last 168 hours.  IMAGING past 24 hours ECHOCARDIOGRAM COMPLETE  Result Date: 04/14/2020    ECHOCARDIOGRAM REPORT   Patient Name:   Vicki Swanson Date of Exam: 04/14/2020 Medical Rec #:  [id number]    Height:       60.0 in Accession #:    [id number]   Weight:       153.7 lb Date of Birth:  [birth year]    BSA:          1.669 m Patient Age:    74 years     BP:           169/64 mmHg Patient  Gender: F            HR:           48 bpm. Exam Location:  Inpatient Procedure: 2D Echo, Color Doppler and Cardiac Doppler Indications:    Stroke i163.9  History:        Patient has prior history of Echocardiogram examinations, most                 recent 09/19/2019. COPD, Arrythmias:Atrial Fibrillation; Risk                 Factors:Hypertension. Prior performed at [facility].  Sonographer:    Irving Burton Senior RDCS Referring Phys: [phone] DESIREE METZGER-CIHELKA  Sonographer Comments: Suboptimal parasternal window due to COPD IMPRESSIONS  1. Left ventricular ejection fraction, by estimation, is 65 to 70%. The left ventricle has normal function. The left ventricle has no regional wall motion abnormalities. There is mild left ventricular hypertrophy. Left ventricular diastolic parameters are consistent with Grade II diastolic dysfunction (pseudonormalization).  2. Right ventricular systolic  function is normal. The right ventricular size is normal. Tricuspid regurgitation signal is inadequate for assessing PA pressure.  3. Left atrial size was mildly dilated.  4. Right atrial size was mildly dilated.  5. The mitral valve is normal in structure. Trivial mitral valve regurgitation. No evidence of mitral stenosis.  6. The aortic valve is tricuspid. Aortic valve regurgitation is not visualized. Mild aortic valve sclerosis is present, with no evidence of aortic valve stenosis.  7. The inferior vena cava is normal in size with greater than 50% respiratory variability, suggesting right atrial pressure of 3 mmHg. FINDINGS  Left Ventricle: Left ventricular ejection fraction, by estimation, is 65 to 70%. The left ventricle has normal function. The left ventricle has no regional wall motion abnormalities. The left ventricular internal cavity size was normal in size. There is  mild left ventricular hypertrophy. Left ventricular diastolic parameters are consistent with Grade II diastolic dysfunction (pseudonormalization). Right Ventricle:  The right ventricular size is normal. No increase in right ventricular wall thickness. Right ventricular systolic function is normal. Tricuspid regurgitation signal is inadequate for assessing PA pressure. Left Atrium: Left atrial size was mildly dilated. Right Atrium: Right atrial size was mildly dilated. Pericardium: Trivial pericardial effusion is present. Mitral Valve: The mitral valve is normal in structure. There is mild thickening of the mitral valve leaflet(s). Trivial mitral valve regurgitation. No evidence of mitral valve stenosis. Tricuspid Valve: The tricuspid valve is normal in structure. Tricuspid valve regurgitation is not demonstrated. Aortic Valve: The aortic valve is tricuspid. Aortic valve regurgitation is not visualized. Mild aortic valve sclerosis is present, with no evidence of aortic valve stenosis. Pulmonic Valve: The pulmonic valve was normal in structure. Pulmonic valve regurgitation is not visualized. Aorta: The aortic root is normal in size and structure. Venous: The inferior vena cava is normal in size with greater than 50% respiratory variability, suggesting right atrial pressure of 3 mmHg. IAS/Shunts: No atrial level shunt detected by color flow Doppler.  LEFT VENTRICLE PLAX 2D LVIDd:         3.50 cm  Diastology LVIDs:         2.20 cm  LV e' medial:    6.42 cm/s LV PW:         1.20 cm  LV E/e' medial:  17.8 LV IVS:        1.10 cm  LV e' lateral:   6.64 cm/s LVOT diam:     1.80 cm  LV E/e' lateral: 17.2 LV SV:         75 LV SV Index:   45 LVOT Area:     2.54 cm  RIGHT VENTRICLE RV S prime:     14.10 cm/s TAPSE (M-mode): 2.3 cm LEFT ATRIUM             Index       RIGHT ATRIUM           Index LA diam:        2.90 cm 1.74 cm/m  RA Area:     11.00 cm LA Vol (A2C):   68.2 ml 40.87 ml/m RA Volume:   22.20 ml  13.30 ml/m LA Vol (A4C):   41.9 ml 25.11 ml/m LA Biplane Vol: 56.2 ml 33.68 ml/m  AORTIC VALVE LVOT Vmax:   127.00 cm/s LVOT Vmean:  84.700 cm/s LVOT VTI:    0.295 m  AORTA Ao  Root diam: 2.50 cm MITRAL VALVE MV Area (PHT): 2.42 cm     SHUNTS MV Decel  Time: 313 msec     Systemic VTI:  0.30 m MV E velocity: 114.00 cm/s  Systemic Diam: 1.80 cm MV A velocity: 77.80 cm/s MV E/A ratio:  1.47 Marca Ancona MD Electronically signed by Marca Ancona MD Signature Date/Time: 04/14/2020/2:20:17 PM    Final    VAS US CAROTID  Result Date: 04/14/2020 Carotid Arterial Duplex Study Indications:       TIA. Risk Factors:      Hypertension. Comparison Study:  No prior studies. Performing Technologist: Jean Rosenthal RDMS  Examination Guidelines: A complete evaluation includes B-mode imaging, spectral Doppler, color Doppler, and power Doppler as needed of all accessible portions of each vessel. Bilateral testing is considered an integral part of a complete examination. Limited examinations for reoccurring indications may be performed as noted.  Right Carotid Findings: +----------+--------+--------+--------+-----------------------+--------+           PSV cm/sEDV cm/sStenosisPlaque Description     Comments +----------+--------+--------+--------+-----------------------+--------+ CCA Prox  123     22                                              +----------+--------+--------+--------+-----------------------+--------+ CCA Distal97      24                                              +----------+--------+--------+--------+-----------------------+--------+ ICA Prox  265     99      60-79%  heterogenous and smooth         +----------+--------+--------+--------+-----------------------+--------+ ICA Mid   287     101     60-79%                                  +----------+--------+--------+--------+-----------------------+--------+ ICA Distal206     59                                              +----------+--------+--------+--------+-----------------------+--------+ ECA       134                                                      +----------+--------+--------+--------+-----------------------+--------+ +----------+--------+-------+----------------+-------------------+           PSV cm/sEDV cmsDescribe        Arm Pressure (mmHG) +----------+--------+-------+----------------+-------------------+ [id number]            Multiphasic, WNL                    +----------+--------+-------+----------------+-------------------+ +---------+--------+--+--------+--+---------+ VertebralPSV cm/s91EDV cm/s14Antegrade +---------+--------+--+--------+--+---------+  Left Carotid Findings: +----------+--------+--------+--------+------------------+--------+           PSV cm/sEDV cm/sStenosisPlaque DescriptionComments +----------+--------+--------+--------+------------------+--------+ CCA Prox  132     30                                         +----------+--------+--------+--------+------------------+--------+ CCA [id number]  34                                         +----------+--------+--------+--------+------------------+--------+ ICA Prox  92      24      1-39%                              +----------+--------+--------+--------+------------------+--------+ ICA Distal194     57                                tortuous +----------+--------+--------+--------+------------------+--------+ ECA       122                                                +----------+--------+--------+--------+------------------+--------+ +----------+--------+--------+----------------+-------------------+           PSV cm/sEDV cm/sDescribe        Arm Pressure (mmHG) +----------+--------+--------+----------------+-------------------+ [id number]             Multiphasic, WNL                    +----------+--------+--------+----------------+-------------------+ +---------+--------+---+--------+--+---------+ VertebralPSV cm/s108EDV cm/s30Antegrade +---------+--------+---+--------+--+---------+   Summary:  Right Carotid: 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 Left Carotid: Velocities in the left ICA are consistent with a 1-39% stenosis. Vertebrals:  Bilateral vertebral arteries demonstrate antegrade flow. Subclavians: Normal flow hemodynamics were seen in bilateral subclavian              arteries. *See table(s) above for measurements and observations.     Preliminary     PHYSICAL EXAM  Temp:  [97.6 F (36.4 C)-99.2 F (37.3 C)] 99.2 F (37.3 C) (12/13 2014) Pulse Rate:  [65-101] 65 (12/13 2014) Resp:  [16-24] 22 (12/13 2014) BP: (159-185)/(64-98) 185/76 (12/13 2014) SpO2:  [98 %-99 %] 98 % (12/13 2014)  General - Well nourished, well developed, in no apparent distress.  Ophthalmologic - fundi not visualized due to noncooperation.  Cardiovascular - Regular rhythm and rate.  Skull - right occipital nerve tenderness and skull base  Mental Status -  Level of arousal and orientation to months, place, and person were intact, but not to year. Language including expression, naming, repetition, comprehension was assessed and found intact. Fund of Knowledge was assessed and was intact.  Cranial Nerves II - XII - II - Visual field intact OU. III, IV, VI - Extraocular movements intact. V - Facial sensation intact bilaterally. VII - Facial movement intact bilaterally. VIII - Hearing & vestibular intact bilaterally. X - Palate elevates symmetrically. XI - Chin turning & shoulder shrug intact bilaterally. XII - Tongue protrusion intact.  Motor Strength - The patient's strength was 4/5 BUEs and 3-/5 BLE proximal and 4/5 knee flexion and pronator drift was absent.  Bulk was normal and fasciculations were absent.   Motor Tone - Muscle tone was assessed at the neck and appendages and was normal.  Reflexes - The patient's reflexes were symmetrical in all extremities and she had no pathological reflexes.  Sensory - Light touch, temperature/pinprick were  assessed and were symmetrical.  Coordination - The patient had normal movements in the hands with no ataxia or dysmetria.  Tremor was absent.  Gait and Station - deferred.   ASSESSMENT/PLAN Ms. Vicki Swanson is a 74 y.o. female with history of COPD, GERD, HTN presenting to [facility] with weakness/fatigue, confusion and dehydration. Found to have the FLU on admission. Developed transient R facial droop, slurred speech and aphasia in hospital. MRI neg. TIA workup pursued.   TIA in setting of ? AF not on AC vs. large vessel disease  CT head 12/10 No acute abnormality.   Code Stroke CT head 12/11 No acute abnormality. Small vessel disease. Old R lentiform nucleus lacune. ASPECTS 10  MRI  12/11 No acute abnormality. Small vessel disease. Atrophy. Old R lentiform nucleus lacune. Sinus dz.  MRA 12/12 intracranial atherosclerosis: severe cavernous L ICA, moderate BA tip, moderate to severe B P1 and R V4 stenoses, mild distal L VA, proximal BA, R siphon  Carotid Doppler  R ICA 60-79%  May consider CTA head and neck if creatinine improves  2D Echo EF 65-70%. No source of embolus. RA mildly dilated.  LDL 87  HgbA1c 4.9  VTE prophylaxis - Heparin 5000 units sq tid   aspirin 325 mg daily prior to admission, now on aspirin 325 mg daily and clopidogrel 75 mg daily. Continue DAPT x 3 months then plavix alone given large vessel stenosis  Therapy recommendations:  SNF  Disposition:  pending   ? atrial fibrillation   Follow with Dr. Desma Maxim for questionable A. Fib  Had EKG and 40-hour Holter monitoring showed frequent PACs and SVT  Will consider loop recorder placement  Occipital neuralgia  Right occipital nerve tenderness on compression  Right scalp intermittent tenderness  Consider with right occipital neuralgia, currently improved  Follow-up with outpatient neurology to consider occipital nerve block if needed  No sign of trigeminal neuralgia  DC  Tegretol  Continue Neurontin 100 tid  Metabolic encephalopathy due to dehydration, Flu infection, AKI and UTI  Creatinine 8.41-4.32-2.06  On Tamiflu for + flu  UA WBC 21-50, urine culture showed E. coli  On Rocephin  IV fluid  Mental status improved  Continue treatment of underlying conditions  Hypertension  Stable . BP goal normotensive  Hyperlipidemia  Home meds:  pravachol 20  LDL 87, goal < 70  Now on lipitor 40  Continue statin at discharge   Other Stroke Risk Factors  Advanced Age >/= 77   Obesity, Body mass index is 30.01 kg/m., BMI >/= 30 associated with increased stroke risk, recommend weight loss, diet and exercise as appropriate   Other Active Problems  COPD  GERD  Hypokalemia  Hospital day # 3  Marvel Plan, MD PhD Stroke Neurology 04/14/2020 8:50 PM    To contact Stroke Continuity provider, please refer to [email or web address]. After hours, contact General Neurology

## 2020-04-14 NOTE — Progress Notes (Signed)
pT EDUCATED ON VACCINATION AND DROPLET PRECAUTION. VERBALIZED UNDERSTANDING BUT REFUSED FLU/ COVID VACCINE IMMUNIZATION RATIONAL OF NOT BEING VACCINATED REENFORCED.

## 2020-04-15 LAB — RENAL FUNCTION PANEL
Albumin: 2.5 g/dL — ABNORMAL LOW (ref 3.5–5.0)
Anion gap: 9 (ref 5–15)
BUN: 40 mg/dL — ABNORMAL HIGH (ref 8–23)
CO2: 27 mmol/L (ref 22–32)
Calcium: 7.4 mg/dL — ABNORMAL LOW (ref 8.9–10.3)
Chloride: 110 mmol/L (ref 98–111)
Creatinine, Ser: 1.68 mg/dL — ABNORMAL HIGH (ref 0.44–1.00)
GFR, Estimated: 32 mL/min — ABNORMAL LOW (ref >60)
Glucose, Bld: 90 mg/dL (ref 70–99)
Phosphorus: 2.7 mg/dL (ref 2.5–4.6)
Potassium: 3.3 mmol/L — ABNORMAL LOW (ref 3.5–5.1)
Sodium: 146 mmol/L — ABNORMAL HIGH (ref 135–145)

## 2020-04-15 LAB — CBC
HCT: 30.6 % — ABNORMAL LOW (ref 36.0–46.0)
Hemoglobin: 11.1 g/dL — ABNORMAL LOW (ref 12.0–15.0)
MCH: 32.3 pg (ref 26.0–34.0)
MCHC: 36.3 g/dL — ABNORMAL HIGH (ref 30.0–36.0)
MCV: 89 fL (ref 80.0–100.0)
Platelets: 125 10*3/uL — ABNORMAL LOW (ref 150–400)
RBC: 3.44 MIL/uL — ABNORMAL LOW (ref 3.87–5.11)
RDW: 13.5 % (ref 11.5–15.5)
WBC: 7.5 10*3/uL (ref 4.0–10.5)
nRBC: 0 % (ref 0.0–0.2)

## 2020-04-15 LAB — MAGNESIUM: Magnesium: 1.2 mg/dL — ABNORMAL LOW (ref 1.7–2.4)

## 2020-04-15 MED ORDER — PROPRANOLOL HCL 40 MG PO TABS
40.0000 mg | ORAL_TABLET | Freq: Every day | ORAL | Status: DC
  Administered 2020-04-15 – 2020-04-17 (×3): 40 mg via ORAL
  Filled 2020-04-15 (×3): qty 1

## 2020-04-15 MED ORDER — SERTRALINE HCL 100 MG PO TABS
100.0000 mg | ORAL_TABLET | Freq: Every day | ORAL | Status: DC
  Administered 2020-04-15 – 2020-04-17 (×3): 100 mg via ORAL
  Filled 2020-04-15 (×3): qty 1

## 2020-04-15 MED ORDER — TOPIRAMATE 25 MG PO TABS
50.0000 mg | ORAL_TABLET | Freq: Two times a day (BID) | ORAL | Status: DC
  Administered 2020-04-15 – 2020-04-17 (×5): 50 mg via ORAL
  Filled 2020-04-15 (×6): qty 2

## 2020-04-15 MED ORDER — POTASSIUM CHLORIDE CRYS ER 20 MEQ PO TBCR
20.0000 meq | EXTENDED_RELEASE_TABLET | Freq: Once | ORAL | Status: AC
  Administered 2020-04-15: 18:00:00 20 meq via ORAL
  Filled 2020-04-15: qty 1

## 2020-04-15 MED ORDER — POTASSIUM CHLORIDE CRYS ER 20 MEQ PO TBCR: 20.0000 meq | EXTENDED_RELEASE_TABLET | Freq: Every day | ORAL | Status: DC

## 2020-04-15 MED ORDER — PANTOPRAZOLE SODIUM 40 MG PO TBEC
40.0000 mg | DELAYED_RELEASE_TABLET | Freq: Every day | ORAL | Status: DC
  Administered 2020-04-15 – 2020-04-17 (×3): 40 mg via ORAL
  Filled 2020-04-15 (×2): qty 1

## 2020-04-15 MED ORDER — POTASSIUM CHLORIDE CRYS ER 20 MEQ PO TBCR
40.0000 meq | EXTENDED_RELEASE_TABLET | Freq: Once | ORAL | Status: AC
  Administered 2020-04-15: 11:00:00 40 meq via ORAL
  Filled 2020-04-15: qty 2

## 2020-04-15 NOTE — Progress Notes (Addendum)
Patient ID: Vicki Swanson, female   DOB: 1946/02/04, 74 y.o.   MRN: [id number] [facility] Progress Note   Assessment/ Plan:   1. Acute kidney Injury on chronic kidney disease stage III: Likely hemodynamically mediated with prerenal azotemia from diarrhea/decreased oral intake.  Renal function continues to improve with downtrend of BUN and creatinine.  Her hydronephrosis is not felt to be significant enough to warrant additional evaluation given consistent improvement of renal function. Will stop IV fluids today and allow PO intake.  2.  Hypokalemia: Secondary to GI losses/limited oral intake and shifts with sodium bicarbonate administration. Replacing via PO route.  3.  Encephalopathy: Status post code stroke with MRI/MRA showing severe intracranial atherosclerotic changes with age-related brain atrophy.  On secondary prophylaxis with aspirin.  Possibly from polypharmacy versus acute renal injury. 4.  Acute influenza A infection: On oseltamavir day 5 of 5 and clinically improved.  Blood cultures negative to date. 5.  History of atrial fibrillation: Rate controlled on propranolol.  Renal service will sign off, Recommend follow up of renal function with labs by PCP in 1-2 weeks.   Subjective:   Reports to be feeling better today--no nausea.    Objective:   BP (!) 145/60 (BP Location: Right Arm)   Pulse 72   Temp 98.7 F (37.1 C) (Oral)   Resp 17   Ht 5' (1.524 m)   Wt 69.7 kg   SpO2 98%   BMI 30.01 kg/m   Intake/Output Summary (Last 24 hours) at 04/15/2020 0827 Last data filed at 04/15/2020 0500 Gross per 24 hour  Intake 1448.98 ml  Output 400 ml  Net 1048.98 ml   Weight change:   Physical Exam: Gen: Comfortably sitting up in bed eating breakfast.  CVS: Pulse regular rhythm, normal rate, S1 and S2 normal Resp: Diminished breath sounds over bases otherwise clear to auscultation, no rales/rhonchi Abd: Soft, obese, nontender, bowel sounds normal Ext: No lower  extremity edema  Imaging: MR ANGIO HEAD WO CONTRAST  Result Date: 04/13/2020 CLINICAL DATA:  74 year old female code stroke presentation yesterday. No acute finding on MRI. EXAM: MRA HEAD WITHOUT CONTRAST TECHNIQUE: Angiographic images of the Circle of Willis were obtained using MRA technique without intravenous contrast. COMPARISON:  Brain MRI 0004 hours today and earlier. FINDINGS: No intracranial mass effect or ventriculomegaly. Antegrade flow in the posterior circulation. Dominant appearing distal left vertebral artery and moderate to severe stenosis of the right vertebral V4 segment (series 1055, image 9). The right AICA appears to be dominant and is patent. Mild irregularity and stenosis of the left V4 segment just upstream of the normal left PICA origin. Patent vertebrobasilar junction. Mild proximal basilar artery stenosis. More moderate irregularity and stenosis of the basilar tip, but the SCA and PCA origins remain patent. Superimposed moderate to severe left and severe short segment right PCA P1 stenoses. Bilateral PCA branches remain patent although are mildly irregular, more so the left. Antegrade flow in both ICA siphons with artifacts suspected on the lowest image. Both siphons are patent although irregular and there is evidence of severe left siphon stenosis in the cavernous segment (flow gap which also corresponds to an area of flow void irregularity on the earlier MRI). On the right side sign some only mild siphon stenosis is suspected. Both carotid termini remain patent. MCA and ACA origins are within normal limits. Visible ACA branches are within normal limits. MCA M1 segments and MCA bi/trifurcations are within normal limits. Visible bilateral MCA branches are mildly irregular. IMPRESSION:  1. Evidence of severe intracranial atherosclerosis, most notably: - Severe stenosis Left ICA cavernous segment. - up to Moderate stenosis at the Basilar tip, with moderate to Severe bilateral PCA P1  stenoses. - Moderate to severe Right Vertebral V4 segment stenosis. 2. Additional mild stenoses including distal Left vertebral artery, proximal Basilar artery, Right siphon. 3. Neurointerventional Radiology consultation would be valuable. Electronically Signed   By: Odessa Fleming M.D.   On: 04/13/2020 13:48   ECHOCARDIOGRAM COMPLETE  Result Date: 04/14/2020    ECHOCARDIOGRAM REPORT   Patient Name:   Vicki Swanson Date of Exam: 04/14/2020 Medical Rec #:  [id number]    Height:       60.0 in Accession #:    [id number]   Weight:       153.7 lb Date of Birth:  04/20/1946    BSA:          1.669 m Patient Age:    74 years     BP:           169/64 mmHg Patient Gender: F            HR:           48 bpm. Exam Location:  Inpatient Procedure: 2D Echo, Color Doppler and Cardiac Doppler Indications:    Stroke i163.9  History:        Patient has prior history of Echocardiogram examinations, most                 recent 09/19/2019. COPD, Arrythmias:Atrial Fibrillation; Risk                 Factors:Hypertension. Prior performed at [facility].  Sonographer:    Irving Burton Senior RDCS Referring Phys: [phone] DESIREE METZGER-CIHELKA  Sonographer Comments: Suboptimal parasternal window due to COPD IMPRESSIONS  1. Left ventricular ejection fraction, by estimation, is 65 to 70%. The left ventricle has normal function. The left ventricle has no regional wall motion abnormalities. There is mild left ventricular hypertrophy. Left ventricular diastolic parameters are consistent with Grade II diastolic dysfunction (pseudonormalization).  2. Right ventricular systolic function is normal. The right ventricular size is normal. Tricuspid regurgitation signal is inadequate for assessing PA pressure.  3. Left atrial size was mildly dilated.  4. Right atrial size was mildly dilated.  5. The mitral valve is normal in structure. Trivial mitral valve regurgitation. No evidence of mitral stenosis.  6. The aortic valve is tricuspid. Aortic valve regurgitation is not  visualized. Mild aortic valve sclerosis is present, with no evidence of aortic valve stenosis.  7. The inferior vena cava is normal in size with greater than 50% respiratory variability, suggesting right atrial pressure of 3 mmHg. FINDINGS  Left Ventricle: Left ventricular ejection fraction, by estimation, is 65 to 70%. The left ventricle has normal function. The left ventricle has no regional wall motion abnormalities. The left ventricular internal cavity size was normal in size. There is  mild left ventricular hypertrophy. Left ventricular diastolic parameters are consistent with Grade II diastolic dysfunction (pseudonormalization). Right Ventricle: The right ventricular size is normal. No increase in right ventricular wall thickness. Right ventricular systolic function is normal. Tricuspid regurgitation signal is inadequate for assessing PA pressure. Left Atrium: Left atrial size was mildly dilated. Right Atrium: Right atrial size was mildly dilated. Pericardium: Trivial pericardial effusion is present. Mitral Valve: The mitral valve is normal in structure. There is mild thickening of the mitral valve leaflet(s). Trivial mitral valve regurgitation. No evidence of mitral valve stenosis. Tricuspid Valve:  The tricuspid valve is normal in structure. Tricuspid valve regurgitation is not demonstrated. Aortic Valve: The aortic valve is tricuspid. Aortic valve regurgitation is not visualized. Mild aortic valve sclerosis is present, with no evidence of aortic valve stenosis. Pulmonic Valve: The pulmonic valve was normal in structure. Pulmonic valve regurgitation is not visualized. Aorta: The aortic root is normal in size and structure. Venous: The inferior vena cava is normal in size with greater than 50% respiratory variability, suggesting right atrial pressure of 3 mmHg. IAS/Shunts: No atrial level shunt detected by color flow Doppler.  LEFT VENTRICLE PLAX 2D LVIDd:         3.50 cm  Diastology LVIDs:         2.20 cm  LV  e' medial:    6.42 cm/s LV PW:         1.20 cm  LV E/e' medial:  17.8 LV IVS:        1.10 cm  LV e' lateral:   6.64 cm/s LVOT diam:     1.80 cm  LV E/e' lateral: 17.2 LV SV:         75 LV SV Index:   45 LVOT Area:     2.54 cm  RIGHT VENTRICLE RV S prime:     14.10 cm/s TAPSE (M-mode): 2.3 cm LEFT ATRIUM             Index       RIGHT ATRIUM           Index LA diam:        2.90 cm 1.74 cm/m  RA Area:     11.00 cm LA Vol (A2C):   68.2 ml 40.87 ml/m RA Volume:   22.20 ml  13.30 ml/m LA Vol (A4C):   41.9 ml 25.11 ml/m LA Biplane Vol: 56.2 ml 33.68 ml/m  AORTIC VALVE LVOT Vmax:   127.00 cm/s LVOT Vmean:  84.700 cm/s LVOT VTI:    0.295 m  AORTA Ao Root diam: 2.50 cm MITRAL VALVE MV Area (PHT): 2.42 cm     SHUNTS MV Decel Time: 313 msec     Systemic VTI:  0.30 m MV E velocity: 114.00 cm/s  Systemic Diam: 1.80 cm MV A velocity: 77.80 cm/s MV E/A ratio:  1.47 Marca Anconaalton Mclean MD Electronically signed by Marca Anconaalton Mclean MD Signature Date/Time: 04/14/2020/2:20:17 PM    Final    VAS US CAROTID  Result Date: 04/14/2020 Carotid Arterial Duplex Study Indications:       TIA. Risk Factors:      Hypertension. Comparison Study:  No prior studies. Performing Technologist: Jean Rosenthalachel Hodge RDMS  Examination Guidelines: A complete evaluation includes B-mode imaging, spectral Doppler, color Doppler, and power Doppler as needed of all accessible portions of each vessel. Bilateral testing is considered an integral part of a complete examination. Limited examinations for reoccurring indications may be performed as noted.  Right Carotid Findings: +----------+--------+--------+--------+-----------------------+--------+           PSV cm/sEDV cm/sStenosisPlaque Description     Comments +----------+--------+--------+--------+-----------------------+--------+ CCA Prox  123     22                                              +----------+--------+--------+--------+-----------------------+--------+ CCA Distal97      24                                               +----------+--------+--------+--------+-----------------------+--------+  ICA Prox  265     99      60-79%  heterogenous and smooth         +----------+--------+--------+--------+-----------------------+--------+ ICA Mid   287     101     60-79%                                  +----------+--------+--------+--------+-----------------------+--------+ ICA Distal206     59                                              +----------+--------+--------+--------+-----------------------+--------+ ECA       134                                                     +----------+--------+--------+--------+-----------------------+--------+ +----------+--------+-------+----------------+-------------------+           PSV cm/sEDV cmsDescribe        Arm Pressure (mmHG) +----------+--------+-------+----------------+-------------------+ [id number]            Multiphasic, WNL                    +----------+--------+-------+----------------+-------------------+ +---------+--------+--+--------+--+---------+ VertebralPSV cm/s91EDV cm/s14Antegrade +---------+--------+--+--------+--+---------+  Left Carotid Findings: +----------+--------+--------+--------+------------------+--------+           PSV cm/sEDV cm/sStenosisPlaque DescriptionComments +----------+--------+--------+--------+------------------+--------+ CCA Prox  132     30                                         +----------+--------+--------+--------+------------------+--------+ CCA Distal118     34                                         +----------+--------+--------+--------+------------------+--------+ ICA Prox  92      24      1-39%                              +----------+--------+--------+--------+------------------+--------+ ICA Distal194     57                                tortuous +----------+--------+--------+--------+------------------+--------+ ECA        122                                                +----------+--------+--------+--------+------------------+--------+ +----------+--------+--------+----------------+-------------------+           PSV cm/sEDV cm/sDescribe        Arm Pressure (mmHG) +----------+--------+--------+----------------+-------------------+ [id number]             Multiphasic, WNL                    +----------+--------+--------+----------------+-------------------+ +---------+--------+---+--------+--+---------+ VertebralPSV cm/s108EDV cm/s30Antegrade +---------+--------+---+--------+--+---------+   Summary: Right Carotid: Velocities in the right ICA are consistent with a 60-79%  stenosis. Left Carotid: Velocities in the left ICA are consistent with a 1-39% stenosis. Vertebrals:  Bilateral vertebral arteries demonstrate antegrade flow. Subclavians: Normal flow hemodynamics were seen in bilateral subclavian              arteries. *See table(s) above for measurements and observations.  Electronically signed by Fabienne Bruns MD on 04/14/2020 at 7:40:14 PM.    Final     Labs: BMET Recent Labs  Lab 04/11/20 1250 04/12/20 0456 04/13/20 0411 04/14/20 0551 04/15/20 0414  NA 134* 139 144 146* 146*  K 4.4 4.0 3.7 3.0* 3.3*  CL 105 115* 120* 108 110  CO2 10* 10* 13* 25 27  GLUCOSE 124* 121* 121* 116* 90  BUN 105* 95* 75* 51* 40*  CREATININE 10.72* 8.41* 4.32* 2.06* 1.68*  CALCIUM 8.7* 8.0* 8.1* 7.7* 7.4*  PHOS  --   --   --   --  2.7   CBC Recent Labs  Lab 04/11/20 1250 04/12/20 0456 04/15/20 0414  WBC 9.4 6.2 7.5  NEUTROABS 5.5  --   --   HGB 15.9* 12.4 11.1*  HCT 45.3 34.4* 30.6*  MCV 88.5 87.5 89.0  PLT 231 118* 125*    Medications:    . aspirin  325 mg Oral Daily  . atorvastatin  40 mg Oral Daily  . Chlorhexidine Gluconate Cloth  6 each Topical Daily  . clopidogrel  75 mg Oral Daily  . docusate sodium  100 mg Oral BID  . gabapentin  100 mg Oral TID  . guaiFENesin   600 mg Oral BID  . heparin  5,000 Units Subcutaneous Q8H  . pantoprazole  40 mg Oral QAC breakfast  . [START ON 04/16/2020] potassium chloride SA  20 mEq Oral Daily  . potassium chloride  40 mEq Oral Once  . propranolol  40 mg Oral Daily  . sertraline  100 mg Oral Daily  . topiramate  50 mg Oral BID   Zetta Bills, MD 04/15/2020, 8:27 AM

## 2020-04-15 NOTE — Progress Notes (Signed)
SLP Cancellation Note  Patient Details Name: Vicki Swanson MRN: [id number] DOB: 10-17-1945   Cancelled treatment:       Reason Eval/Treat Not Completed: Patient at procedure or test/unavailable; getting bathed. Will continue efforts.  Kiyoto Slomski L. Samson Frederic, MA CCC/SLP Acute Rehabilitation Services Office number [phone] Pager [phone]    Vicki Swanson 04/15/2020, 2:28 PM

## 2020-04-15 NOTE — Progress Notes (Signed)
PROGRESS NOTE    Vicki Swanson  ZOX:[id number] DOB: 04-Jun-1945 DOA: 04/11/2020 PCP: Pcp, No   Brief Narrative:   Vicki Swanson a 74 y.o.femalewith medical history significant ofhypertension, COPD, GERD who presented to med [facility] with complaints ofdiarrhea fever, weakness and fatigue for 4 to 5 days prior to presentation with decreased oral intake. In the ED, patient was mildly hypotensive.  Viral screen was positive for influenza A.  Urinalysis showed moderate leukocytosis but no nitrites.  Initial chemistry was notable for creatinine of 10.7 and BUN of 105. Troponin of 4 lactic acid 1.0. CBC entirely within normal. Head CT without contrast was negative. Chest x-ray showed no active finding. Patient was then admitted to hospital for acute influenza.  During hospitalization there was some concern for TIA as well and neurology was consulted.  Assessment & Plan:   Principal Problem:   Influenza Active Problems:   Acute lower UTI   COPD (chronic obstructive pulmonary disease) (HCC)   GERD (gastroesophageal reflux disease)   Essential hypertension   AKI (acute kidney injury) (HCC) Diarrhea  Acute influenza A infection: Continue IV hydration Tamiflu day 5/5.   Blood culture with no growth in 3 days.  No leukocytosis  Questionable TIA.   Head CT scan was negative.  MRI of the head/MRA showed severe intracranial atherosclerotic changes.  Continue aspirin.  MRI of the brain with age-related cerebral atrophy.  Neurology on board and recommend dual antiplatelet therapy for 3 months then Plavix alone.  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  Acute kidney injury:No prior history of chronic kidney disease.  Thought to be secondary to volume depletion from nausea vomiting, diarrhea and poor oral intake with the use of ACE inhibitor's at home.  Continue IV hydration.  Creatinine improving to 1.6 from 10 on presentation.  Continue to hold lisinopril.   Hemoglobin A1c of 4.9.  Patient is still on 75 ml per hour of sodium chloride solution. Encourage oral fluids.  Will DC IV fluids.   Lab Results  Component Value Date   CREATININE 1.68 (H) 04/15/2020   CREATININE 2.06 (H) 04/14/2020   CREATININE 4.32 (H) 04/13/2020   COPD:Continue inhalers.  No acute exacerbation at this time.  GERD:Continue PPI  Essential hypertension:Initially hypotensive.  Antihypertensives on hold.  On propranolol at home.  Will be resumed on propanolol on discharge.  Hypokalemia.  Replenished orally, resume home potassium on discharge.  Metabolic encephalopathy secondary to influenza, acute kidney injury Will monitor.  Improving.  Resume sertraline and Topamax from home.  Continue to hold gabapentin., .  Ammonia was 23.  TSH 0.6.  Vitamin B12 at  547.  history of atrial fibrillation: On propranolol at home. Has been resumed.   Abnormal urinalysis.  Urine culture with 10,000 colony of Enterococcus.  No evidence of UTI.  No indication for antibiotic.  DVT prophylaxis: heparin injection 5,000 Units Start: 04/12/20 0145  Code Status: Full code  Family Communication: I again spoke with the patient's husband at bedside  Status is: Inpatient  Remains inpatient appropriate because:IV treatments appropriate due to intensity of illness or inability to take PO and Inpatient level of care appropriate due to severity of illness,   Dispo: The patient is from: Home              Anticipated d/c is to: Skilled nursing facility/CIR as per PT              Anticipated d/c date is: 1-2 days  Patient currently is not medically stable to d/c.  Consult: Neurology  Procedures:  None  Antimicrobials:   none   Subjective: Today, patient was seen and examined at bedside.  Feels much better today.  Denies any nausea vomiting.  Had one episode of diarrhea today.  No chest pain but has mild cough.  Has improved appetite.  Wants to go  home.   Objective: Vitals:   04/14/20 2113 04/14/20 2117 04/14/20 2135 04/14/20 2309  BP: (!) 181/86 (!) 181/76 (!) 154/57 (!) 145/60  Pulse: 63  66 72  Resp: 17  20 17   Temp:    98.7 F (37.1 C)  TempSrc:    Oral  SpO2:   99% 98%  Weight:      Height:        Intake/Output Summary (Last 24 hours) at 04/15/2020 0734 Last data filed at 04/15/2020 0500 Gross per 24 hour  Intake 1448.98 ml  Output 400 ml  Net 1048.98 ml   Filed Weights   04/11/20 1259 04/12/20 0449 04/12/20 2112  Weight: 65.3 kg 69.7 kg 69.7 kg   Body mass index is 30.01 kg/m.   Physical examination:  General: Obese built, not in obvious distress, alert awake communicative HENT:   No scleral pallor or icterus noted. Oral mucosa is moist.  Chest:   Diminished breath sounds bilaterally. No crackles or wheezes.  CVS: S1 &S2 heard. No murmur.  Regular rate and rhythm. Abdomen: Soft, nontender, nondistended.  Bowel sounds are heard.   Extremities: No cyanosis, clubbing or edema.  Peripheral pulses are palpable. Psych: Alert, awake and communicative, normal mood CNS:  No cranial nerve deficits.  Power equal in all extremities.  Generalized weakness noted Skin: Warm and dry.  No rashes noted.   Data Reviewed: I have personally reviewed following labs and imaging studies  CBC: Recent Labs  Lab 04/11/20 1250 04/12/20 0456 04/15/20 0414  WBC 9.4 6.2 7.5  NEUTROABS 5.5  --   --   HGB 15.9* 12.4 11.1*  HCT 45.3 34.4* 30.6*  MCV 88.5 87.5 89.0  PLT 231 118* 125*   Basic Metabolic Panel: Recent Labs  Lab 04/11/20 1250 04/12/20 0456 04/13/20 0411 04/14/20 0551 04/15/20 0414  NA 134* 139 144 146* 146*  K 4.4 4.0 3.7 3.0* 3.3*  CL 105 115* 120* 108 110  CO2 10* 10* 13* 25 27  GLUCOSE 124* 121* 121* 116* 90  BUN 105* 95* 75* 51* 40*  CREATININE 10.72* 8.41* 4.32* 2.06* 1.68*  CALCIUM 8.7* 8.0* 8.1* 7.7* 7.4*  MG  --   --   --   --  1.2*  PHOS  --   --   --   --  2.7   GFR: Estimated Creatinine  Clearance: 25.2 mL/min (A) (by C-G formula based on SCr of 1.68 mg/dL (H)). Liver Function Tests: Recent Labs  Lab 04/11/20 1250 04/12/20 0456 04/15/20 0414  AST 11* 11*  --   ALT 11 9  --   ALKPHOS 98 68  --   BILITOT 0.7 0.4  --   PROT 7.9 5.6*  --   ALBUMIN 4.3 3.0* 2.5*   No results for input(s): LIPASE, AMYLASE in the last 168 hours. Recent Labs  Lab 04/12/20 2307  AMMONIA 23   Coagulation Profile: Recent Labs  Lab 04/11/20 1250  INR 1.1   Cardiac Enzymes: No results for input(s): CKTOTAL, CKMB, CKMBINDEX, TROPONINI in the last 168 hours. BNP (last 3 results) No results for input(s): PROBNP in  the last 8760 hours. HbA1C: Recent Labs    04/13/20 1023  HGBA1C 4.9   CBG: Recent Labs  Lab 04/11/20 1249 04/12/20 2143  GLUCAP 110* 126*   Lipid Profile: Recent Labs    04/13/20 1023  CHOL 143  HDL 24*  LDLCALC 87  TRIG 734*  CHOLHDL 6.0   Thyroid Function Tests: Recent Labs    04/12/20 2307  TSH 0.601   Anemia Panel: Recent Labs    04/12/20 2307  VITAMINB12 547   Sepsis Labs: Recent Labs  Lab 04/11/20 1250  LATICACIDVEN 1.0    Recent Results (from the past 240 hour(s))  Blood culture (routine x 2)     Status: None (Preliminary result)   Collection Time: 04/11/20 12:50 PM   Specimen: BLOOD  Result Value Ref Range Status   Specimen Description   Final    BLOOD RIGHT ANTECUBITAL Performed at [facility], [address] [place], [place] [address]    Special Requests   Final    BOTTLES DRAWN AEROBIC AND ANAEROBIC Blood Culture adequate volume Performed at [facility], [address] [place], [place] [address]    Culture   Final    NO GROWTH 3 DAYS Performed at [facility], [address] [place], [place] [address]    Report Status PENDING  Incomplete  Blood culture (routine x 2)     Status: None (Preliminary result)   Collection Time: 04/11/20  1:11 PM   Specimen: BLOOD  Result Value Ref Range  Status   Specimen Description   Final    BLOOD LEFT ANTECUBITAL Performed at [facility], [address] [place], [place] [address]    Special Requests   Final    BOTTLES DRAWN AEROBIC AND ANAEROBIC Blood Culture adequate volume Performed at [facility], [address] [place], [place] [address]    Culture   Final    NO GROWTH 3 DAYS Performed at [facility], [address], [place], [place] [address]    Report Status PENDING  Incomplete  Resp Panel by RT-PCR (Flu A&B, Covid) Nasopharyngeal Swab     Status: Abnormal   Collection Time: 04/11/20  1:12 PM   Specimen: Nasopharyngeal Swab; Nasopharyngeal(NP) swabs in vial transport medium  Result Value Ref Range Status   SARS Coronavirus 2 by RT PCR NEGATIVE NEGATIVE Final    Comment: (NOTE) SARS-CoV-2 target nucleic acids are NOT DETECTED.  The SARS-CoV-2 RNA is generally detectable in upper respiratory specimens during the acute phase of infection. The lowest concentration of SARS-CoV-2 viral copies this assay can detect is 138 copies/mL. A negative result does not preclude SARS-Cov-2 infection and should not be used as the sole basis for treatment or other patient management decisions. A negative result may occur with  improper specimen collection/handling, submission of specimen other than nasopharyngeal swab, presence of viral mutation(s) within the areas targeted by this assay, and inadequate number of viral copies(<138 copies/mL). A negative result must be combined with clinical observations, patient history, and epidemiological information. The expected result is Negative.  Fact Sheet for Patients:  BloggerCourse.com  Fact Sheet for Healthcare Providers:  SeriousBroker.it  This test is no t yet approved or cleared by the [place] FDA and  has been authorized for detection and/or diagnosis of SARS-CoV-2 by FDA under an Emergency Use  Authorization (EUA). This EUA will remain  in effect (meaning this test can be used)  for the duration of the COVID-19 declaration under Section 564(b)(1) of the Act, 21 U.S.C.section 360bbb-3(b)(1), unless the authorization is terminated  or revoked sooner.       Influenza A by PCR POSITIVE (A) NEGATIVE Final   Influenza B by PCR NEGATIVE NEGATIVE Final    Comment: (NOTE) The Xpert Xpress SARS-CoV-2/FLU/RSV plus assay is intended as an aid in the diagnosis of influenza from Nasopharyngeal swab specimens and should not be used as a sole basis for treatment. Nasal washings and aspirates are unacceptable for Xpert Xpress SARS-CoV-2/FLU/RSV testing.  Fact Sheet for Patients: BloggerCourse.com  Fact Sheet for Healthcare Providers: SeriousBroker.it  This test is not yet approved or cleared by the [place] FDA and has been authorized for detection and/or diagnosis of SARS-CoV-2 by FDA under an Emergency Use Authorization (EUA). This EUA will remain in effect (meaning this test can be used) for the duration of the COVID-19 declaration under Section 564(b)(1) of the Act, 21 U.S.C. section 360bbb-3(b)(1), unless the authorization is terminated or revoked.  Performed at [facility], [address] [place], [place] [address]   Urine culture     Status: Abnormal   Collection Time: 04/11/20  5:00 PM   Specimen: In/Out Cath Urine  Result Value Ref Range Status   Specimen Description   Final    IN/OUT CATH URINE Performed at [facility], [address] [place], [place] [address]    Special Requests   Final    NONE Performed at [facility], [address] [place], [place] [address]    Culture 10,000 COLONIES/mL ENTEROCOCCUS FAECALIS (A)  Final   Report Status 04/14/2020 FINAL  Final   Organism ID, Bacteria ENTEROCOCCUS FAECALIS (A)  Final      Susceptibility   Enterococcus faecalis - MIC*     AMPICILLIN <=2 SENSITIVE Sensitive     NITROFURANTOIN <=16 SENSITIVE Sensitive     VANCOMYCIN 1 SENSITIVE Sensitive     * 10,000 COLONIES/mL ENTEROCOCCUS FAECALIS  Culture, Urine     Status: None   Collection Time: 04/12/20 10:21 AM   Specimen: Urine, Random  Result Value Ref Range Status   Specimen Description URINE, RANDOM  Final   Special Requests NONE  Final   Culture   Final    NO GROWTH Performed at [facility], [address] [place], [place] [address]    Report Status 04/13/2020 FINAL  Final         Radiology Studies: MR ANGIO HEAD WO CONTRAST  Result Date: 04/13/2020 CLINICAL DATA:  74 year old female code stroke presentation yesterday. No acute finding on MRI. EXAM: MRA HEAD WITHOUT CONTRAST TECHNIQUE: Angiographic images of the Circle of Willis were obtained using MRA technique without intravenous contrast. COMPARISON:  Brain MRI 0004 hours today and earlier. FINDINGS: No intracranial mass effect or ventriculomegaly. Antegrade flow in the posterior circulation. Dominant appearing distal left vertebral artery and moderate to severe stenosis of the right vertebral V4 segment (series 1055, image 9). The right AICA appears to be dominant and is patent. Mild irregularity and stenosis of the left V4 segment just upstream of the normal left PICA origin. Patent vertebrobasilar junction. Mild proximal basilar artery stenosis. More moderate irregularity and stenosis of the basilar tip, but the SCA and PCA origins remain patent. Superimposed moderate to severe left and severe short segment right PCA P1 stenoses. Bilateral PCA branches remain patent although are mildly irregular, more so the left. Antegrade flow  in both ICA siphons with artifacts suspected on the lowest image. Both siphons are patent although irregular and there is evidence of severe left siphon stenosis in the cavernous segment (flow gap which also corresponds to an area of flow void irregularity on the earlier MRI).  On the right side sign some only mild siphon stenosis is suspected. Both carotid termini remain patent. MCA and ACA origins are within normal limits. Visible ACA branches are within normal limits. MCA M1 segments and MCA bi/trifurcations are within normal limits. Visible bilateral MCA branches are mildly irregular. IMPRESSION: 1. Evidence of severe intracranial atherosclerosis, most notably: - Severe stenosis Left ICA cavernous segment. - up to Moderate stenosis at the Basilar tip, with moderate to Severe bilateral PCA P1 stenoses. - Moderate to severe Right Vertebral V4 segment stenosis. 2. Additional mild stenoses including distal Left vertebral artery, proximal Basilar artery, Right siphon. 3. Neurointerventional Radiology consultation would be valuable. Electronically Signed   By: Odessa Fleming M.D.   On: 04/13/2020 13:48   ECHOCARDIOGRAM COMPLETE  Result Date: 04/14/2020    ECHOCARDIOGRAM REPORT   Patient Name:   Vicki Swanson Date of Exam: 04/14/2020 Medical Rec #:  [id number]    Height:       60.0 in Accession #:    [id number]   Weight:       153.7 lb Date of Birth:  Jul 25, 1945    BSA:          1.669 m Patient Age:    74 years     BP:           169/64 mmHg Patient Gender: F            HR:           48 bpm. Exam Location:  Inpatient Procedure: 2D Echo, Color Doppler and Cardiac Doppler Indications:    Stroke i163.9  History:        Patient has prior history of Echocardiogram examinations, most                 recent 09/19/2019. COPD, Arrythmias:Atrial Fibrillation; Risk                 Factors:Hypertension. Prior performed at [facility].  Sonographer:    Irving Burton Senior RDCS Referring Phys: [phone] DESIREE METZGER-CIHELKA  Sonographer Comments: Suboptimal parasternal window due to COPD IMPRESSIONS  1. Left ventricular ejection fraction, by estimation, is 65 to 70%. The left ventricle has normal function. The left ventricle has no regional wall motion abnormalities. There is mild left ventricular hypertrophy. Left  ventricular diastolic parameters are consistent with Grade II diastolic dysfunction (pseudonormalization).  2. Right ventricular systolic function is normal. The right ventricular size is normal. Tricuspid regurgitation signal is inadequate for assessing PA pressure.  3. Left atrial size was mildly dilated.  4. Right atrial size was mildly dilated.  5. The mitral valve is normal in structure. Trivial mitral valve regurgitation. No evidence of mitral stenosis.  6. The aortic valve is tricuspid. Aortic valve regurgitation is not visualized. Mild aortic valve sclerosis is present, with no evidence of aortic valve stenosis.  7. The inferior vena cava is normal in size with greater than 50% respiratory variability, suggesting right atrial pressure of 3 mmHg. FINDINGS  Left Ventricle: Left ventricular ejection fraction, by estimation, is 65 to 70%. The left ventricle has normal function. The left ventricle has no regional wall motion abnormalities. The left ventricular internal cavity size was normal in size. There is  mild left  ventricular hypertrophy. Left ventricular diastolic parameters are consistent with Grade II diastolic dysfunction (pseudonormalization). Right Ventricle: The right ventricular size is normal. No increase in right ventricular wall thickness. Right ventricular systolic function is normal. Tricuspid regurgitation signal is inadequate for assessing PA pressure. Left Atrium: Left atrial size was mildly dilated. Right Atrium: Right atrial size was mildly dilated. Pericardium: Trivial pericardial effusion is present. Mitral Valve: The mitral valve is normal in structure. There is mild thickening of the mitral valve leaflet(s). Trivial mitral valve regurgitation. No evidence of mitral valve stenosis. Tricuspid Valve: The tricuspid valve is normal in structure. Tricuspid valve regurgitation is not demonstrated. Aortic Valve: The aortic valve is tricuspid. Aortic valve regurgitation is not visualized. Mild  aortic valve sclerosis is present, with no evidence of aortic valve stenosis. Pulmonic Valve: The pulmonic valve was normal in structure. Pulmonic valve regurgitation is not visualized. Aorta: The aortic root is normal in size and structure. Venous: The inferior vena cava is normal in size with greater than 50% respiratory variability, suggesting right atrial pressure of 3 mmHg. IAS/Shunts: No atrial level shunt detected by color flow Doppler.  LEFT VENTRICLE PLAX 2D LVIDd:         3.50 cm  Diastology LVIDs:         2.20 cm  LV e' medial:    6.42 cm/s LV PW:         1.20 cm  LV E/e' medial:  17.8 LV IVS:        1.10 cm  LV e' lateral:   6.64 cm/s LVOT diam:     1.80 cm  LV E/e' lateral: 17.2 LV SV:         75 LV SV Index:   45 LVOT Area:     2.54 cm  RIGHT VENTRICLE RV S prime:     14.10 cm/s TAPSE (M-mode): 2.3 cm LEFT ATRIUM             Index       RIGHT ATRIUM           Index LA diam:        2.90 cm 1.74 cm/m  RA Area:     11.00 cm LA Vol (A2C):   68.2 ml 40.87 ml/m RA Volume:   22.20 ml  13.30 ml/m LA Vol (A4C):   41.9 ml 25.11 ml/m LA Biplane Vol: 56.2 ml 33.68 ml/m  AORTIC VALVE LVOT Vmax:   127.00 cm/s LVOT Vmean:  84.700 cm/s LVOT VTI:    0.295 m  AORTA Ao Root diam: 2.50 cm MITRAL VALVE MV Area (PHT): 2.42 cm     SHUNTS MV Decel Time: 313 msec     Systemic VTI:  0.30 m MV E velocity: 114.00 cm/s  Systemic Diam: 1.80 cm MV A velocity: 77.80 cm/s MV E/A ratio:  1.47 Marca Ancona MD Electronically signed by Marca Ancona MD Signature Date/Time: 04/14/2020/2:20:17 PM    Final    VAS US CAROTID  Result Date: 04/14/2020 Carotid Arterial Duplex Study Indications:       TIA. Risk Factors:      Hypertension. Comparison Study:  No prior studies. Performing Technologist: Jean Rosenthal RDMS  Examination Guidelines: A complete evaluation includes B-mode imaging, spectral Doppler, color Doppler, and power Doppler as needed of all accessible portions of each vessel. Bilateral testing is considered an integral  part of a complete examination. Limited examinations for reoccurring indications may be performed as noted.  Right Carotid Findings: +----------+--------+--------+--------+-----------------------+--------+  PSV cm/sEDV cm/sStenosisPlaque Description     Comments +----------+--------+--------+--------+-----------------------+--------+ CCA Prox  123     22                                              +----------+--------+--------+--------+-----------------------+--------+ CCA Distal97      24                                              +----------+--------+--------+--------+-----------------------+--------+ ICA Prox  265     99      60-79%  heterogenous and smooth         +----------+--------+--------+--------+-----------------------+--------+ ICA Mid   287     101     60-79%                                  +----------+--------+--------+--------+-----------------------+--------+ ICA Distal206     59                                              +----------+--------+--------+--------+-----------------------+--------+ ECA       134                                                     +----------+--------+--------+--------+-----------------------+--------+ +----------+--------+-------+----------------+-------------------+           PSV cm/sEDV cmsDescribe        Arm Pressure (mmHG) +----------+--------+-------+----------------+-------------------+ [id number]            Multiphasic, WNL                    +----------+--------+-------+----------------+-------------------+ +---------+--------+--+--------+--+---------+ VertebralPSV cm/s91EDV cm/s14Antegrade +---------+--------+--+--------+--+---------+  Left Carotid Findings: +----------+--------+--------+--------+------------------+--------+           PSV cm/sEDV cm/sStenosisPlaque DescriptionComments +----------+--------+--------+--------+------------------+--------+ CCA Prox  132      30                                         +----------+--------+--------+--------+------------------+--------+ CCA Distal118     34                                         +----------+--------+--------+--------+------------------+--------+ ICA Prox  92      24      1-39%                              +----------+--------+--------+--------+------------------+--------+ ICA Distal194     57                                tortuous +----------+--------+--------+--------+------------------+--------+ ECA       122                                                +----------+--------+--------+--------+------------------+--------+ +----------+--------+--------+----------------+-------------------+  PSV cm/sEDV cm/sDescribe        Arm Pressure (mmHG) +----------+--------+--------+----------------+-------------------+ [id number]             Multiphasic, WNL                    +----------+--------+--------+----------------+-------------------+ +---------+--------+---+--------+--+---------+ VertebralPSV cm/s108EDV cm/s30Antegrade +---------+--------+---+--------+--+---------+   Summary: Right Carotid: 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 Left Carotid: Velocities in the left ICA are consistent with a 1-39% stenosis. Vertebrals:  Bilateral vertebral arteries demonstrate antegrade flow. Subclavians: Normal flow hemodynamics were seen in bilateral subclavian              arteries. *See table(s) above for measurements and observations.  Electronically signed by Fabienne Bruns MD on 04/14/2020 at 7:40:14 PM.    Final       Scheduled Meds: . aspirin  325 mg Oral Daily  . atorvastatin  40 mg Oral Daily  . Chlorhexidine Gluconate Cloth  6 each Topical Daily  . clopidogrel  75 mg Oral Daily  . docusate sodium  100 mg Oral BID  . gabapentin  100 mg Oral TID  . guaiFENesin  600 mg Oral BID  . heparin  5,000 Units  Subcutaneous Q8H   Continuous Infusions: . sodium chloride 20 mL/hr at 04/11/20 1315  . sodium chloride 75 mL/hr at 04/14/20 2132     LOS: 4 days    Joycelyn Das, MD [facility] 04/15/2020,

## 2020-04-15 NOTE — Progress Notes (Signed)
STROKE TEAM PROGRESS NOTE   INTERVAL HISTORY Her husband is at the bedside.  Patient lying in bed, more awake alert, less lethargic than yesterday. No HA.   Vitals:   04/14/20 2117 04/14/20 2135 04/14/20 2309 04/15/20 0814  BP: (!) 181/76 (!) 154/57 (!) 145/60 (!) 184/78  Pulse:  66 72 96  Resp:  20 17 20   Temp:   98.7 F (37.1 C) 99.4 F (37.4 C)  TempSrc:   Oral Oral  SpO2:  99% 98% 96%  Weight:      Height:       CBC:  Recent Labs  Lab 04/11/20 1250 04/12/20 0456 04/15/20 0414  WBC 9.4 6.2 7.5  NEUTROABS 5.5  --   --   HGB 15.9* 12.4 11.1*  HCT 45.3 34.4* 30.6*  MCV 88.5 87.5 89.0  PLT 231 118* 125*   Basic Metabolic Panel:  Recent Labs  Lab 04/14/20 0551 04/15/20 0414  NA 146* 146*  K 3.0* 3.3*  CL 108 110  CO2 25 27  GLUCOSE 116* 90  BUN 51* 40*  CREATININE 2.06* 1.68*  CALCIUM 7.7* 7.4*  MG  --  1.2*  PHOS  --  2.7   Lipid Panel:  Recent Labs  Lab 04/13/20 1023  CHOL 143  TRIG 159*  HDL 24*  CHOLHDL 6.0  VLDL 32  LDLCALC 87   HgbA1c:  Recent Labs  Lab 04/13/20 1023  HGBA1C 4.9   Urine Drug Screen: No results for input(s): LABOPIA, COCAINSCRNUR, LABBENZ, AMPHETMU, THCU, LABBARB in the last 168 hours.  Alcohol Level No results for input(s): ETH in the last 168 hours.  IMAGING past 24 hours ECHOCARDIOGRAM COMPLETE  Result Date: 04/14/2020    ECHOCARDIOGRAM REPORT   Patient Name:   Vicki Swanson Date of Exam: 04/14/2020 Medical Rec #:  04/16/2020    Height:       60.0 in Accession #:    [id number]   Weight:       153.7 lb Date of Birth:  21-Sep-1945    BSA:          1.669 m Patient Age:    74 years     BP:           169/64 mmHg Patient Gender: F            HR:           48 bpm. Exam Location:  Inpatient Procedure: 2D Echo, Color Doppler and Cardiac Doppler Indications:    Stroke i163.9  History:        Patient has prior history of Echocardiogram examinations, most                 recent 09/19/2019. COPD, Arrythmias:Atrial Fibrillation; Risk                  Factors:Hypertension. Prior performed at [facility].  Sonographer:    [facility] Senior RDCS Referring Phys: [phone] DESIREE METZGER-CIHELKA  Sonographer Comments: Suboptimal parasternal window due to COPD IMPRESSIONS  1. Left ventricular ejection fraction, by estimation, is 65 to 70%. The left ventricle has normal function. The left ventricle has no regional wall motion abnormalities. There is mild left ventricular hypertrophy. Left ventricular diastolic parameters are consistent with Grade II diastolic dysfunction (pseudonormalization).  2. Right ventricular systolic function is normal. The right ventricular size is normal. Tricuspid regurgitation signal is inadequate for assessing PA pressure.  3. Left atrial size was mildly dilated.  4. Right atrial size was mildly dilated.  5. The mitral valve is normal in structure. Trivial mitral valve regurgitation. No evidence of mitral stenosis.  6. The aortic valve is tricuspid. Aortic valve regurgitation is not visualized. Mild aortic valve sclerosis is present, with no evidence of aortic valve stenosis.  7. The inferior vena cava is normal in size with greater than 50% respiratory variability, suggesting right atrial pressure of 3 mmHg. FINDINGS  Left Ventricle: Left ventricular ejection fraction, by estimation, is 65 to 70%. The left ventricle has normal function. The left ventricle has no regional wall motion abnormalities. The left ventricular internal cavity size was normal in size. There is  mild left ventricular hypertrophy. Left ventricular diastolic parameters are consistent with Grade II diastolic dysfunction (pseudonormalization). Right Ventricle: The right ventricular size is normal. No increase in right ventricular wall thickness. Right ventricular systolic function is normal. Tricuspid regurgitation signal is inadequate for assessing PA pressure. Left Atrium: Left atrial size was mildly dilated. Right Atrium: Right atrial size was mildly dilated.  Pericardium: Trivial pericardial effusion is present. Mitral Valve: The mitral valve is normal in structure. There is mild thickening of the mitral valve leaflet(s). Trivial mitral valve regurgitation. No evidence of mitral valve stenosis. Tricuspid Valve: The tricuspid valve is normal in structure. Tricuspid valve regurgitation is not demonstrated. Aortic Valve: The aortic valve is tricuspid. Aortic valve regurgitation is not visualized. Mild aortic valve sclerosis is present, with no evidence of aortic valve stenosis. Pulmonic Valve: The pulmonic valve was normal in structure. Pulmonic valve regurgitation is not visualized. Aorta: The aortic root is normal in size and structure. Venous: The inferior vena cava is normal in size with greater than 50% respiratory variability, suggesting right atrial pressure of 3 mmHg. IAS/Shunts: No atrial level shunt detected by color flow Doppler.  LEFT VENTRICLE PLAX 2D LVIDd:         3.50 cm  Diastology LVIDs:         2.20 cm  LV e' medial:    6.42 cm/s LV PW:         1.20 cm  LV E/e' medial:  17.8 LV IVS:        1.10 cm  LV e' lateral:   6.64 cm/s LVOT diam:     1.80 cm  LV E/e' lateral: 17.2 LV SV:         75 LV SV Index:   45 LVOT Area:     2.54 cm  RIGHT VENTRICLE RV S prime:     14.10 cm/s TAPSE (M-mode): 2.3 cm LEFT ATRIUM             Index       RIGHT ATRIUM           Index LA diam:        2.90 cm 1.74 cm/m  RA Area:     11.00 cm LA Vol (A2C):   68.2 ml 40.87 ml/m RA Volume:   22.20 ml  13.30 ml/m LA Vol (A4C):   41.9 ml 25.11 ml/m LA Biplane Vol: 56.2 ml 33.68 ml/m  AORTIC VALVE LVOT Vmax:   127.00 cm/s LVOT Vmean:  84.700 cm/s LVOT VTI:    0.295 m  AORTA Ao Root diam: 2.50 cm MITRAL VALVE MV Area (PHT): 2.42 cm     SHUNTS MV Decel Time: 313 msec     Systemic VTI:  0.30 m MV E velocity: 114.00 cm/s  Systemic Diam: 1.80 cm MV A velocity: 77.80 cm/s MV E/A ratio:  1.47 Marca Ancona MD Electronically  signed by Marca Ancona MD Signature Date/Time:  04/14/2020/2:20:17 PM    Final    VAS US CAROTID  Result Date: 04/14/2020 Carotid Arterial Duplex Study Indications:       TIA. Risk Factors:      Hypertension. Comparison Study:  No prior studies. Performing Technologist: Jean Rosenthal RDMS  Examination Guidelines: A complete evaluation includes B-mode imaging, spectral Doppler, color Doppler, and power Doppler as needed of all accessible portions of each vessel. Bilateral testing is considered an integral part of a complete examination. Limited examinations for reoccurring indications may be performed as noted.  Right Carotid Findings: +----------+--------+--------+--------+-----------------------+--------+           PSV cm/sEDV cm/sStenosisPlaque Description     Comments +----------+--------+--------+--------+-----------------------+--------+ CCA Prox  123     22                                              +----------+--------+--------+--------+-----------------------+--------+ CCA Distal97      24                                              +----------+--------+--------+--------+-----------------------+--------+ ICA Prox  265     99      60-79%  heterogenous and smooth         +----------+--------+--------+--------+-----------------------+--------+ ICA Mid   287     101     60-79%                                  +----------+--------+--------+--------+-----------------------+--------+ ICA Distal206     59                                              +----------+--------+--------+--------+-----------------------+--------+ ECA       134                                                     +----------+--------+--------+--------+-----------------------+--------+ +----------+--------+-------+----------------+-------------------+           PSV cm/sEDV cmsDescribe        Arm Pressure (mmHG) +----------+--------+-------+----------------+-------------------+ [id number]            Multiphasic, WNL                     +----------+--------+-------+----------------+-------------------+ +---------+--------+--+--------+--+---------+ VertebralPSV cm/s91EDV cm/s14Antegrade +---------+--------+--+--------+--+---------+  Left Carotid Findings: +----------+--------+--------+--------+------------------+--------+           PSV cm/sEDV cm/sStenosisPlaque DescriptionComments +----------+--------+--------+--------+------------------+--------+ CCA Prox  132     30                                         +----------+--------+--------+--------+------------------+--------+ CCA Distal118     34                                         +----------+--------+--------+--------+------------------+--------+  ICA Prox  92      24      1-39%                              +----------+--------+--------+--------+------------------+--------+ ICA Distal194     57                                tortuous +----------+--------+--------+--------+------------------+--------+ ECA       122                                                +----------+--------+--------+--------+------------------+--------+ +----------+--------+--------+----------------+-------------------+           PSV cm/sEDV cm/sDescribe        Arm Pressure (mmHG) +----------+--------+--------+----------------+-------------------+ [id number]             Multiphasic, WNL                    +----------+--------+--------+----------------+-------------------+ +---------+--------+---+--------+--+---------+ VertebralPSV cm/s108EDV cm/s30Antegrade +---------+--------+---+--------+--+---------+   Summary: Right Carotid: 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 Left Carotid: Velocities in the left ICA are consistent with a 1-39% stenosis. Vertebrals:  Bilateral vertebral arteries demonstrate antegrade flow. Subclavians: Normal flow hemodynamics were seen in bilateral subclavian               arteries. *See table(s) above for measurements and observations.  Electronically signed by Fabienne Bruns MD on 04/14/2020 at 7:40:14 PM.    Final     PHYSICAL EXAM  Temp:  [97.6 F (36.4 C)-99.4 F (37.4 C)] 99.4 F (37.4 C) (12/14 0814) Pulse Rate:  [63-96] 96 (12/14 0814) Resp:  [17-24] 20 (12/14 0814) BP: (145-185)/(57-98) 184/78 (12/14 0814) SpO2:  [96 %-99 %] 96 % (12/14 0814)  General - Well nourished, well developed, in no apparent distress.  Ophthalmologic - fundi not visualized due to noncooperation.  Cardiovascular - Regular rhythm and rate.  Skull - right occipital nerve tenderness and skull base  Mental Status -  Level of arousal and orientation to months, place, and person were intact, but not to year. Language including expression, naming, repetition, comprehension was assessed and found intact. Fund of Knowledge was assessed and was intact.  Cranial Nerves II - XII - II - Visual field intact OU. III, IV, VI - Extraocular movements intact. V - Facial sensation intact bilaterally. VII - Facial movement intact bilaterally. VIII - Hearing & vestibular intact bilaterally. X - Palate elevates symmetrically. XI - Chin turning & shoulder shrug intact bilaterally. XII - Tongue protrusion intact.  Motor Strength - The patient's strength was 4/5 BUEs and 3-/5 BLE proximal and 4/5 knee flexion and pronator drift was absent.  Bulk was normal and fasciculations were absent.   Motor Tone - Muscle tone was assessed at the neck and appendages and was normal.  Reflexes - The patient's reflexes were symmetrical in all extremities and she had no pathological reflexes.  Sensory - Light touch, temperature/pinprick were assessed and were symmetrical.    Coordination - The patient had normal movements in the hands with no ataxia or dysmetria.  Tremor was absent.  Gait and Station - deferred.   ASSESSMENT/PLAN Vicki Swanson  is a 74 y.o. female with history of COPD, GERD,  HTN [facility] weakness/fatigue, confusion and dehydration. Found to have the FLU on admission. Developed transient R facial droop, slurred speech and aphasia in hospital. MRI neg. TIA workup pursued.   TIA in setting of ? AF not on AC vs. large vessel disease  CT head 12/10 No acute abnormality.   Code Stroke CT head 12/11 No acute abnormality. Small vessel disease. Old R lentiform nucleus lacune. ASPECTS 10  MRI  12/11 No acute abnormality. Small vessel disease. Atrophy. Old R lentiform nucleus lacune. Sinus dz.  MRA 12/12 intracranial atherosclerosis: severe cavernous L ICA, moderate BA tip, moderate to severe B P1 and R V4 stenoses, mild distal L VA, proximal BA, R siphon  Carotid Doppler  R ICA 60-79%  May consider CTA head and neck if creatinine further improves  2D Echo EF 65-70%. No source of embolus. RA mildly dilated.  LDL 87  HgbA1c 4.9  VTE prophylaxis - Heparin 5000 units sq tid   aspirin 325 mg daily prior to admission, now on aspirin 325 mg daily and clopidogrel 75 mg daily. Continue DAPT x 3 months then plavix alone given large vessel stenosis  Therapy recommendations:  SNF  Disposition:  pending   ? atrial fibrillation   Follow with Dr. Desma MaximMcGukin for questionable A. Fib  Had EKG and 40-hour Holter monitoring showed frequent PACs and SVT  Recommend loop recorder placement - cardiology EP contacted  Occipital neuralgia  Right occipital nerve tenderness on compression  Right scalp intermittent tenderness  Consider with right occipital neuralgia, currently improved  Follow-up with outpatient neurology to consider occipital nerve block if needed  No sign of trigeminal neuralgia  DC Tegretol  Continue Neurontin 100 tid  Metabolic encephalopathy due to dehydration, Flu infection, AKI and UTI  Creatinine 10.72->8.41->4.32->2.06->1.68  On Tamiflu for + flu  UA WBC 21-50, urine culture showed E. coli  On Rocephin  IV  fluid  Mental status improved  Continue treatment of underlying conditions  Hypertension  Stable . BP goal normotensive  Hyperlipidemia  Home meds:  pravachol 20  LDL 87, goal < 70  Now on lipitor 40  Continue statin at discharge   Other Stroke Risk Factors  Advanced Age >/= 3765   Obesity, Body mass index is 30.01 kg/m., BMI >/= 30 associated with increased stroke risk, recommend weight loss, diet and exercise as appropriate   Other Active Problems  COPD  GERD  Hypokalemia  Hospital day # 4   Marvel PlanJindong Marlyss Cissell, MD PhD Stroke Neurology 04/15/2020 7:18 PM   To contact Stroke Continuity provider, please refer to [email or web address]. After hours, contact General Neurology

## 2020-04-15 NOTE — Progress Notes (Signed)
Physical Therapy Treatment Patient Details Name: Vicki Swanson MRN: [id number] DOB: June 08, 1945 Today's Date: 04/15/2020    History of Present Illness 74 y.o. female with medical history significant of hypertension, COPD, GERD who presented to [place] with c/o diarrhea, fever, weakness, and fatigue. Pt admitted with acute influenza infection as well as acute kidney injury. Code stroke called 12/11. MRI negative. Code stroke canceled due to symptoms resolved.    PT Comments    Patient received in bed, pleasant and cooperative with PT today. BPs elevated during session with systolic in 400Q in supine and up to 190s sitting EOB; RN informed and administered IV BP medication during session. Shows improved overall mobility and tolerance to activity today however still needs 2 person physical assist for safety due to impaired cognition and gross weakness. She politely declines up to chair due to episodes of diarrhea today, and was left in bed with all needs met, bed alarm active and spouse present. Family/patient is currently refusing SNF as they report that they had been told she would have to stay in quarantine for 2 weeks since she has not had the covid vaccine (unsure of accuracy of this, passed onto CSW), but they are potentially interested in CIR here in the hospital. Feel she may now be able to tolerate intensity of CIR and would do well with intensive therapies moving forward.   Follow Up Recommendations  CIR;Supervision/Assistance - 24 hour     Equipment Recommendations  Rolling walker with 5" wheels;3in1 (PT);Wheelchair (measurements PT);Wheelchair cushion (measurements PT)    Recommendations for Other Services       Precautions / Restrictions Precautions Precautions: Fall Precaution Comments: watch BP Restrictions Weight Bearing Restrictions: No    Mobility  Bed Mobility Overal bed mobility: Needs Assistance Bed Mobility: Supine to Sit;Sit to Supine     Supine to  sit: Min assist;+2 for safety/equipment Sit to supine: Min assist;+2 for safety/equipment   General bed mobility comments: significantly improved today but still with need for light physical assist for trunk and BLE management  Transfers Overall transfer level: Needs assistance Equipment used: Rolling walker (2 wheeled) Transfers: Sit to/from Stand Sit to Stand: Min assist;+2 physical assistance         General transfer comment: MinAx2 to boost to upright standing position and maintain balance while sidestepping alongside EOB; politely refused up to chair due to having multiple bouts of unpredictable diarrhea today  Ambulation/Gait             General Gait Details: able to take side steps up and down the bed with MinAx2 and RW   Stairs             Wheelchair Mobility    Modified Rankin (Stroke Patients Only)       Balance Overall balance assessment: Needs assistance Sitting-balance support: Bilateral upper extremity supported Sitting balance-Leahy Scale: Fair Sitting balance - Comments: min guard assist sitting EOB   Standing balance support: Bilateral upper extremity supported;During functional activity Standing balance-Leahy Scale: Poor Standing balance comment: reliant on external support                            Cognition Arousal/Alertness: Awake/alert Behavior During Therapy: Flat affect Overall Cognitive Status: Impaired/Different from baseline Area of Impairment: Attention;Memory;Following commands;Safety/judgement;Awareness;Problem solving                   Current Attention Level: Sustained Memory: Decreased short-term memory Following Commands:  Follows one step commands inconsistently;Follows one step commands with increased time Safety/Judgement: Decreased awareness of safety;Decreased awareness of deficits Awareness: Intellectual Problem Solving: Slow processing;Decreased initiation;Difficulty sequencing;Requires verbal  cues;Requires tactile cues General Comments: very easily distracted with poor attention span; definite STM deficits and poor processing requiring cues for safety and sequencing.      Exercises      General Comments General comments (skin integrity, edema, etc.): VSS on RA, BP elevated during session with systolic in 476L at EOB- RN administered IV BP meds during session      Pertinent Vitals/Pain Pain Assessment: No/denies pain Pain Intervention(s): Limited activity within patient's tolerance;Monitored during session    Home Living                      Prior Function            PT Goals (current goals can now be found in the care plan section) Acute Rehab PT Goals Patient Stated Goal: not stated PT Goal Formulation: Patient unable to participate in goal setting Time For Goal Achievement: 04/27/20 Potential to Achieve Goals: Good Progress towards PT goals: Progressing toward goals    Frequency    Min 3X/week      PT Plan Discharge plan needs to be updated;Equipment recommendations need to be updated    Co-evaluation              AM-PAC PT "6 Clicks" Mobility   Outcome Measure  Help needed turning from your back to your side while in a flat bed without using bedrails?: A Little Help needed moving from lying on your back to sitting on the side of a flat bed without using bedrails?: A Lot Help needed moving to and from a bed to a chair (including a wheelchair)?: A Lot Help needed standing up from a chair using your arms (e.g., wheelchair or bedside chair)?: A Lot Help needed to walk in hospital room?: A Lot Help needed climbing 3-5 steps with a railing? : Total 6 Click Score: 12    End of Session Equipment Utilized During Treatment: Gait belt Activity Tolerance: Patient tolerated treatment well Patient left: in bed;with call bell/phone within reach;with bed alarm set;with family/visitor present Nurse Communication: Mobility status PT Visit  Diagnosis: Other abnormalities of gait and mobility (R26.89);Muscle weakness (generalized) (M62.81);Pain;Unsteadiness on feet (R26.81)     Time: 1340-1400 PT Time Calculation (min) (ACUTE ONLY): 20 min  Charges:  $Therapeutic Activity: 8-22 mins                     Windell Norfolk, DPT, PN1   Supplemental Physical Therapist [place]    Pager [phone] Acute Rehab Office [phone]

## 2020-04-15 NOTE — TOC Progression Note (Addendum)
Transition of Care [facility]) - Progression Note    Patient Details  Name: Vicki Swanson MRN: [id number] Date of Birth: [birth year]  [facility] CM/SW Contact  Mearl Latin, LCSW Phone Number: 04/15/2020, 2:39 PM  Clinical Narrative:    2:39pm- CSW spoke with patient's spouse but will call him back in a few minutes as the MD is with them currently. SNF bed offers are [facility], [facility], and [facility] (patient has to be fever free for 24 hours with no fever reducing meds).   3:15pm-CSW provided SNF bed offers to patient's spouse. He reported that they may want to return home with home health but would like for CSW to check back tomorrow. CSW has left a voicemail for the accepting SNFs to check on visitation policy. Insurance authorization is approved pending decision.   5pm-CSW spoke with patient's spouse and let him know that visitation will be allowed at SNF while patient is under quarantine due to new bill passed. CSW awaiting response from [facility] and [facility] to see if either of them would be able to accept patient as spouse was hoping for offers closer to home.   Expected Discharge Plan: Skilled Nursing Facility Barriers to Discharge: Continued Medical Work up,SNF Pending bed offer,Insurance Authorization  Expected Discharge Plan and Services Expected Discharge Plan: Skilled Nursing Facility       Living arrangements for the past 2 months: Single Family Home                                       Social Determinants of Health (SDOH) Interventions    Readmission Risk Interventions No flowsheet data found.

## 2020-04-16 ENCOUNTER — Encounter (HOSPITAL_COMMUNITY)
Admission: EM | Disposition: A | Discharge status: Home Health | Payer: Self-pay | Source: Home / Self Care | Attending: Internal Medicine

## 2020-04-16 ENCOUNTER — Encounter (HOSPITAL_COMMUNITY): Payer: Self-pay | Admitting: Cardiology

## 2020-04-16 DIAGNOSIS — I6389 Other cerebral infarction: Secondary | ICD-10-CM

## 2020-04-16 HISTORY — PX: LOOP RECORDER INSERTION: EP1214

## 2020-04-16 LAB — BASIC METABOLIC PANEL
Anion gap: 9 (ref 5–15)
BUN: 28 mg/dL — ABNORMAL HIGH (ref 8–23)
CO2: 22 mmol/L (ref 22–32)
Calcium: 7.8 mg/dL — ABNORMAL LOW (ref 8.9–10.3)
Chloride: 113 mmol/L — ABNORMAL HIGH (ref 98–111)
Creatinine, Ser: 1.48 mg/dL — ABNORMAL HIGH (ref 0.44–1.00)
GFR, Estimated: 37 mL/min — ABNORMAL LOW (ref >60)
Glucose, Bld: 87 mg/dL (ref 70–99)
Potassium: 3.8 mmol/L (ref 3.5–5.1)
Sodium: 144 mmol/L (ref 135–145)

## 2020-04-16 LAB — CBC
HCT: 31.1 % — ABNORMAL LOW (ref 36.0–46.0)
Hemoglobin: 10.7 g/dL — ABNORMAL LOW (ref 12.0–15.0)
MCH: 31.1 pg (ref 26.0–34.0)
MCHC: 34.4 g/dL (ref 30.0–36.0)
MCV: 90.4 fL (ref 80.0–100.0)
Platelets: 146 10*3/uL — ABNORMAL LOW (ref 150–400)
RBC: 3.44 MIL/uL — ABNORMAL LOW (ref 3.87–5.11)
RDW: 13.5 % (ref 11.5–15.5)
WBC: 8.2 10*3/uL (ref 4.0–10.5)
nRBC: 0 % (ref 0.0–0.2)

## 2020-04-16 LAB — CULTURE, BLOOD (ROUTINE X 2)
Culture: NO GROWTH
Culture: NO GROWTH
Special Requests: ADEQUATE
Special Requests: ADEQUATE

## 2020-04-16 SURGERY — LOOP RECORDER INSERTION

## 2020-04-16 MED ORDER — HYDRALAZINE HCL 20 MG/ML IJ SOLN
10.0000 mg | INTRAMUSCULAR | Status: DC | PRN
  Administered 2020-04-17: 13:00:00 10 mg via INTRAVENOUS
  Filled 2020-04-16: qty 1

## 2020-04-16 MED ORDER — LIDOCAINE-EPINEPHRINE 1 %-1:100000 IJ SOLN
INTRAMUSCULAR | Status: DC | PRN
  Administered 2020-04-16: 30 mL

## 2020-04-16 MED ORDER — LIDOCAINE-EPINEPHRINE 1 %-1:100000 IJ SOLN
INTRAMUSCULAR | Status: AC
  Filled 2020-04-16: qty 1

## 2020-04-16 SURGICAL SUPPLY — 2 items
MONITOR REVEAL LINQ II (Prosthesis & Implant Heart) ×3 IMPLANT
PACK LOOP INSERTION (CUSTOM PROCEDURE TRAY) ×3 IMPLANT

## 2020-04-16 NOTE — Discharge Instructions (Signed)

## 2020-04-16 NOTE — Progress Notes (Signed)
EP following at a distance for loop recorder based on discharge timing. Continue telemetry   Casimiro Needle "Otilio Saber, [place]  04/16/2020 7:59 AM

## 2020-04-16 NOTE — Progress Notes (Addendum)
Inpatient Rehab Admissions Coordinator Note:   Per updated PT recommendations, pt was screened for CIR candidacy by Estill Dooms, PT, DPT.  At this time pt could potentially be a candidate for CIR, though would be difficult to get insurance auth with OT recommending SNF and with diagnosis of influenza.  If pt would like to be considered for CIR, please have OT re-evaluate and place an IP Rehab MD consult order.  Please contact me with questions.   Estill Dooms, PT, DPT [phone] 04/16/20 10:48 AM

## 2020-04-16 NOTE — TOC Progression Note (Addendum)
Transition of Care [facility]) - Progression Note    Patient Details  Name: Vicki Swanson MRN: [id number] Date of Birth: 09/26/1945  Transition of Care [facility]) CM/SW Contact  Mearl Latin, LCSW Phone Number: 04/16/2020, 9:53 AM  Clinical Narrative:    9:53am-CSW made patient's spouse aware that both [facility] and [facility] are able to accept patient. He stated he would like to speak with the doctor before making a decision on disposition. CSW contacted [employer]; patient's approval will expire today but may can be extended.   12pm-CSW received return call from patient's spouse. He is requesting CIR or [place] [facility]. Awaiting OT reconsult and requested IR consult from MD. Faxed referral to [facility] for review as well.    Expected Discharge Plan: Skilled Nursing Facility Barriers to Discharge: Continued Medical Work up,SNF Pending bed offer,Insurance Authorization  Expected Discharge Plan and Services Expected Discharge Plan: Skilled Nursing Facility       Living arrangements for the past 2 months: Single Family Home                                       Social Determinants of Health (SDOH) Interventions    Readmission Risk Interventions No flowsheet data found.

## 2020-04-16 NOTE — Progress Notes (Signed)
Thank you for consult on Vicki Swanson. Chart and therapy notes reviewed. Note that current recommendations are for HHPT and HHOT at this time. Will defer CIR consult for now.

## 2020-04-16 NOTE — Progress Notes (Signed)
Occupational Therapy Treatment Patient Details Name: Vicki Swanson MRN: [id number] DOB: 06/29/1945 Today's Date: 04/16/2020    History of present illness 74 y.o. female with medical history significant of hypertension, COPD, GERD who presented to [place] with c/o diarrhea, fever, weakness, and fatigue. Pt admitted with acute influenza infection as well as acute kidney injury. Code stroke called 12/11. MRI negative. Code stroke canceled due to symptoms resolved.   OT comments  OT treatment session with focus on self-care re-education, ADL transfers, and short-distance functional mobility in prep for self-care tasks. Patient reports feeling significantly better this date than during previous therapy sessions. Patient A&Ox4, follows 1-2 step verbal commands with 100% accuracy, and demonstrates good safety awareness throughout session. Patient able to transition from supine to EOB without external assist, walked to commode in bathroom with mild unsteadiness resolving with use of RW, and completed toileting/hygiene/clothing management with supervision A and use of grab bar. Patient would like to d/c home with family and 24hr supervision/assist. OT in agreement with d/c destination. Patient's husband present at bedside reports ability to provided necessary assist/supervision. OT will continue to follow acutely to maximize safety and independence with self-care tasks in prep for d/c home.    Follow Up Recommendations  Home health OT;Supervision/Assistance - 24 hour    Equipment Recommendations  3 in 1 bedside commode    Recommendations for Other Services      Precautions / Restrictions Precautions Precautions: Fall Precaution Comments: watch BP Restrictions Weight Bearing Restrictions: No       Mobility Bed Mobility Overal bed mobility: Modified Independent Bed Mobility: Supine to Sit           General bed mobility comments: HOB flat and slightly increased  time.  Transfers Overall transfer level: Needs assistance Equipment used: Rolling walker (2 wheeled) Transfers: Sit to/from [facility] Sit to Stand: Supervision Stand pivot transfers: Min guard;Supervision       General transfer comment: Min gaurd without AD and supervision A with use of RW.    Balance Overall balance assessment: Needs assistance Sitting-balance support: Feet supported Sitting balance-Leahy Scale: Good Sitting balance - Comments: Patient able to doff/don footwear seated EOB without LOB.   Standing balance support: Bilateral upper extremity supported Standing balance-Leahy Scale: Fair Standing balance comment: Patient able to complete clothing management in standing and hand hygiene at sink level without UE support and no LOB. Patient unsteady when ambulating without AD.                           ADL either performed or assessed with clinical judgement   ADL       Grooming: Supervision/safety;Sitting           Upper Body Dressing : Set up;Sitting   Lower Body Dressing: Min guard;Sit to/from stand   Toilet Transfer: Supervision/safety;RW;Grab bars   Toileting- Water quality scientist and Hygiene: Supervision/safety;Sit to/from stand       Functional mobility during ADLs: Min guard;Rolling walker;Supervision/safety General ADL Comments: Patient reports feeling significantly better this date. Completed ADL tasks with Min guard to supervision A.     Vision       Perception     Praxis      Cognition Arousal/Alertness: Awake/alert Behavior During Therapy: WFL for tasks assessed/performed Overall Cognitive Status: Within Functional Limits for tasks assessed  General Comments: Patient A&Ox4. Appropriately follows 1-2 step verbal commands and demonstrates fair safety awareness during ADLs and ADL transfers with use of RW.        Exercises     Shoulder Instructions        General Comments Patient with wheezing during activity. Education on pursed lip breathing and activity pacing. Patient expressed verbal understanding.    Pertinent Vitals/ Pain       Pain Assessment: No/denies pain Pain Intervention(s): Monitored during session  Home Living                                          Prior Functioning/Environment              Frequency  Min 2X/week        Progress Toward Goals  OT Goals(current goals can now be found in the care plan section)  Progress towards OT goals: Goals met and updated - see care plan  Acute Rehab OT Goals Patient Stated Goal: To return home in time for her grandsons birthday tomorrow. OT Goal Formulation: With patient Time For Goal Achievement: 04/27/20 Potential to Achieve Goals: Good ADL Goals Pt Will Perform Grooming: with modified independence;standing Pt Will Perform Upper Body Dressing: with modified independence;sitting Pt Will Perform Lower Body Dressing: with modified independence;sit to/from stand Pt Will Transfer to Toilet: with modified independence;grab bars Pt Will Perform Toileting - Clothing Manipulation and hygiene: with modified independence;sit to/from stand  Plan Discharge plan needs to be updated    Co-evaluation    PT/OT/SLP Co-Evaluation/Treatment: Yes            AM-PAC OT "6 Clicks" Daily Activity     Outcome Measure   Help from another person eating meals?: None Help from another person taking care of personal grooming?: A Little Help from another person toileting, which includes using toliet, bedpan, or urinal?: A Little Help from another person bathing (including washing, rinsing, drying)?: A Little Help from another person to put on and taking off regular upper body clothing?: None Help from another person to put on and taking off regular lower body clothing?: A Little 6 Click Score: 20    End of Session Equipment Utilized During Treatment: Gait  belt;Rolling walker  OT Visit Diagnosis: Unsteadiness on feet (R26.81);Other abnormalities of gait and mobility (R26.89);Muscle weakness (generalized) (M62.81)   Activity Tolerance Patient tolerated treatment well   Patient Left in chair;with call bell/phone within reach;with chair alarm set;with family/visitor present   Nurse Communication          Time: 1829-9371 OT Time Calculation (min): 32 min  Charges: OT General Charges $OT Visit: 1 Visit OT Treatments $Self Care/Home Management : 23-37 mins  Sumedh Shinsato H. OTR/L Supplemental OT, Department of rehab services [phone]   Keny Donald R H. 04/16/2020, 3:33 PM

## 2020-04-16 NOTE — Progress Notes (Signed)
[facility] PROGRESS NOTE    Vicki Swanson  XBJ:[id number] DOB: Sep 16, 1945 DOA: 04/11/2020 PCP: Pcp, No      Brief Narrative:  Vicki Swanson a 74 y.o.femalewith medical history significant ofhypertension, COPD, GERD who presented to med [facility] with complaints ofdiarrhea fever, weakness and fatigue for 4 to 5 days prior to presentation with decreased oral intake. In the ED, patient was mildly hypotensive.  Viral screen was positive for influenza A.  Urinalysis showed moderate leukocytosis but no nitrites.  Initial chemistry was notable for creatinine of 10.7 and BUN of 105. Troponin of 4 lactic acid 1.0. CBC entirely within normal. Head CT without contrast was negative. Chest x-ray showed no active finding. Patient was then admitted to hospital for acute influenza.  During hospitalization there was some concern for TIA as well and neurology was consulted.    Past Medical History:  Diagnosis Date  . COPD (chronic obstructive pulmonary disease) (HCC)   . GERD (gastroesophageal reflux disease)   . Hypertension       Assessment & Plan:  Acute influenza A infection:  Improved.  Questionable TIA.   Head CT scan was negative.  MRI of the head/MRA showed severe intracranial atherosclerotic changes.  Continue aspirin.  MRI of the brain with age-related cerebral atrophy.  Neurology on board and recommend dual antiplatelet therapy for 3 months then Plavix alone.  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  Will need outpatient follow-up/CTA if her renal function continues to improve.  Her husband is requesting for acute rehab.  Acute kidney injury:No prior history of chronic kidney disease.  Thought to be secondary to volume depletion from nausea vomiting, diarrhea and poor oral intake with the use of ACE inhibitor's at home. Creatinine improving to 1.6 from 10 on presentation.  Continue to hold lisinopril.  Hemoglobin A1c of 4.9.   Encourage oral fluids.    Recent Labs       Lab Results  Component Value Date   CREATININE 1.68 (H) 04/15/2020   CREATININE 2.06 (H) 04/14/2020   CREATININE 4.32 (H) 04/13/2020     COPD:Continue inhalers.  No acute exacerbation at this time.  GERD:Continue PPI  Essential hypertension:Initially hypotensive.  Antihypertensives on hold.  On propranolol at home.    Hypokalemia.  Replenished orally, resume home potassium on discharge.  Metabolic encephalopathy secondary to influenza, acute kidney injury  Improving.  Resume sertraline and Topamax from home.  Continue to hold gabapentin  history of atrial fibrillation: On propranolol at home. Has been resumed.   Abnormal urinalysis.  Urine culture with 10,000 colony of Enterococcus.  No evidence of UTI.  No indication for antibiotic.  DVT prophylaxis: heparin injection 5,000 Units Start: 04/12/20 0145  Code Status: Full code  Family Communication: I again spoke with the patient's husband at bedside  Status is: Inpatient            . aspirin  325 mg Oral Daily  . atorvastatin  40 mg Oral Daily  . Chlorhexidine Gluconate Cloth  6 each Topical Daily  . clopidogrel  75 mg Oral Daily  . docusate sodium  100 mg Oral BID  . gabapentin  100 mg Oral TID  . guaiFENesin  600 mg Oral BID  . heparin  5,000 Units Subcutaneous Q8H  . pantoprazole  40 mg Oral QAC breakfast  . propranolol  40 mg Oral Daily  . sertraline  100 mg Oral Daily  . topiramate  50 mg Oral BID  Current Meds  Medication Sig  . ascorbic acid (VITAMIN C) 500 MG tablet Take 500 mg by mouth daily.  Marland Kitchen aspirin 325 MG tablet Take 325 mg by mouth daily.  . B Complex Vitamins (VITAMIN B COMPLEX) TABS Take 1 tablet by mouth daily.  Marland Kitchen gabapentin (NEURONTIN) 300 MG capsule Take 300 mg by mouth daily.  Marland Kitchen lisinopril (ZESTRIL) 20 MG tablet Take 20 mg by mouth daily.  . pantoprazole (PROTONIX) 40 MG tablet Take 40 mg by mouth daily before  breakfast.  . potassium chloride SA (KLOR-CON) 20 MEQ tablet Take 20 mEq by mouth daily.  . pravastatin (PRAVACHOL) 20 MG tablet Take 20 mg by mouth every evening.  . propranolol (INDERAL) 40 MG tablet Take 40 mg by mouth daily.  . sertraline (ZOLOFT) 100 MG tablet Take 100 mg by mouth daily.  Marland Kitchen topiramate (TOPAMAX) 25 MG tablet Take 50 mg by mouth 2 (two) times daily.        Disposition: Status is: Inpatient  Remains inpatient appropriate because:Altered mental status   Dispo: The patient is from: Home              Anticipated d/c is to: Home              Anticipated d/c date is: 1 day              Patient currently is not medically stable to d/c.              MDM: The below labs and imaging reports were reviewed and summarized above.  Medication management as above.     DVT prophylaxis: heparin injection 5,000 Units Start: 04/12/20 0145  Code Status: Full code  family Communication: Discussed with patient's husband at the bedside.   Consultants:   Neurology            Subjective: Patient reports that she feels better today.  Objective: Vitals:   04/16/20 0314 04/16/20 0830 04/16/20 1235 04/16/20 1700  BP: (!) 167/75 (!) 199/78 (!) 185/89 (!) 216/85  Pulse: 60  60   Resp: 19  18   Temp: 98 F (36.7 C) 98.2 F (36.8 C) 98.2 F (36.8 C)   TempSrc: Oral Axillary Oral   SpO2: 99%     Weight:      Height:        Intake/Output Summary (Last 24 hours) at 04/16/2020 1734 Last data filed at 04/16/2020 0300 Gross per 24 hour  Intake --  Output 800 ml  Net -800 ml   Filed Weights   04/11/20 1259 04/12/20 0449 04/12/20 2112  Weight: 65.3 kg 69.7 kg 69.7 kg    Examination: General: Not in obvious distress, alert awake . HENT:   No scleral pallor or icterus noted. Oral mucosa is moist.  Chest:   Clear to auscultation bilaterally. No crackles or wheezes.  CVS: S1 &S2 heard. No murmur.  Regular rate and rhythm. Abdomen: Soft,  nontender, nondistended.  Bowel sounds are normoactive. Extremities: No cyanosis, or edema.   Psych: Alert, awake.  Normal mood and affect CNS:  No cranial nerve deficits.  Power equal in all extremities.   Skin: Warm and dry.  No rashes noted.   Data Reviewed: I have personally reviewed following labs and imaging studies:  CBC: Recent Labs  Lab 04/11/20 1250 04/12/20 0456 04/15/20 0414 04/16/20 0346  WBC 9.4 6.2 7.5 8.2  NEUTROABS 5.5  --   --   --   HGB 15.9* 12.4 11.1* 10.7*  HCT 45.3 34.4* 30.6* 31.1*  MCV 88.5 87.5 89.0 90.4  PLT 231 118* 125* 146*   Basic Metabolic Panel: Recent Labs  Lab 04/12/20 0456 04/13/20 0411 04/14/20 0551 04/15/20 0414 04/16/20 0346  NA 139 144 146* 146* 144  K 4.0 3.7 3.0* 3.3* 3.8  CL 115* 120* 108 110 113*  CO2 10* 13* 25 27 22   GLUCOSE 121* 121* 116* 90 87  BUN 95* 75* 51* 40* 28*  CREATININE 8.41* 4.32* 2.06* 1.68* 1.48*  CALCIUM 8.0* 8.1* 7.7* 7.4* 7.8*  MG  --   --   --  1.2*  --   PHOS  --   --   --  2.7  --    GFR: Estimated Creatinine Clearance: 28.6 mL/min (A) (by C-G formula based on SCr of 1.48 mg/dL (H)). Liver Function Tests: Recent Labs  Lab 04/11/20 1250 04/12/20 0456 04/15/20 0414  AST 11* 11*  --   ALT 11 9  --   ALKPHOS 98 68  --   BILITOT 0.7 0.4  --   PROT 7.9 5.6*  --   ALBUMIN 4.3 3.0* 2.5*   No results for input(s): LIPASE, AMYLASE in the last 168 hours. Recent Labs  Lab 04/12/20 2307  AMMONIA 23   Coagulation Profile: Recent Labs  Lab 04/11/20 1250  INR 1.1   Cardiac Enzymes: No results for input(s): CKTOTAL, CKMB, CKMBINDEX, TROPONINI in the last 168 hours. BNP (last 3 results) No results for input(s): PROBNP in the last 8760 hours. HbA1C: No results for input(s): HGBA1C in the last 72 hours. CBG: Recent Labs  Lab 04/11/20 1249 04/12/20 2143  GLUCAP 110* 126*   Lipid Profile: No results for input(s): CHOL, HDL, LDLCALC, TRIG, CHOLHDL, LDLDIRECT in the last 72 hours. Thyroid  Function Tests: No results for input(s): TSH, T4TOTAL, FREET4, T3FREE, THYROIDAB in the last 72 hours. Anemia Panel: No results for input(s): VITAMINB12, FOLATE, FERRITIN, TIBC, IRON, RETICCTPCT in the last 72 hours. Urine analysis:    Component Value Date/Time   COLORURINE YELLOW 04/11/2020 1700   APPEARANCEUR CLOUDY (A) 04/11/2020 1700   LABSPEC >1.030 (H) 04/11/2020 1700   PHURINE 5.0 04/11/2020 1700   GLUCOSEU NEGATIVE 04/11/2020 1700   HGBUR SMALL (A) 04/11/2020 1700   BILIRUBINUR NEGATIVE 04/11/2020 1700   KETONESUR NEGATIVE 04/11/2020 1700   PROTEINUR 30 (A) 04/11/2020 1700   NITRITE NEGATIVE 04/11/2020 1700   LEUKOCYTESUR MODERATE (A) 04/11/2020 1700   Sepsis Labs: @LABRCNTIP (procalcitonin:4,lacticacidven:4)  ) Recent Results (from the past 240 hour(s))  Blood culture (routine x 2)     Status: None   Collection Time: 04/11/20 12:50 PM   Specimen: BLOOD  Result Value Ref Range Status   Specimen Description   Final    BLOOD RIGHT ANTECUBITAL Performed at [facility] [place], [address] [place], [place] [address]    Special Requests   Final    BOTTLES DRAWN AEROBIC AND ANAEROBIC Blood Culture adequate volume Performed at [facility], [address] [place], [place] [address]    Culture   Final    NO GROWTH 5 DAYS Performed at [facility], [address] [place], [place] [address]    Report Status 04/16/2020 FINAL  Final  Blood culture (routine x 2)     Status: None   Collection Time: 04/11/20  1:11 PM   Specimen: BLOOD  Result Value Ref Range Status   Specimen Description   Final    BLOOD LEFT ANTECUBITAL  Performed at [facility], [address] [place], [place] [address]    Special Requests   Final    BOTTLES DRAWN AEROBIC AND ANAEROBIC Blood Culture adequate volume Performed at [facility], [address] [place], [place] [address]    Culture   Final    NO GROWTH 5 DAYS Performed at [facility], [address] [place], [place] [address]    Report Status 04/16/2020 FINAL  Final  Resp Panel by RT-PCR (Flu A&B, Covid) Nasopharyngeal Swab     Status: Abnormal   Collection Time: 04/11/20  1:12 PM   Specimen: Nasopharyngeal Swab; Nasopharyngeal(NP) swabs in vial transport medium  Result Value Ref Range Status   SARS Coronavirus 2 by RT PCR NEGATIVE NEGATIVE Final    Comment: (NOTE) SARS-CoV-2 target nucleic acids are NOT DETECTED.  The SARS-CoV-2 RNA is generally detectable in upper respiratory specimens during the acute phase of infection. The lowest concentration of SARS-CoV-2 viral copies this assay can detect is 138 copies/mL. A negative result does not preclude SARS-Cov-2 infection and should not be used as the sole basis for treatment or other patient management decisions. A negative result may occur with  improper specimen collection/handling, submission of specimen other than nasopharyngeal swab, presence of viral mutation(s) within the areas targeted by this assay, and inadequate number of viral copies(<138 copies/mL). A negative result must be combined with clinical observations, patient history, and epidemiological information. The expected result is Negative.  Fact Sheet for Patients:  BloggerCourse.com  Fact Sheet for Healthcare Providers:  SeriousBroker.it  This test is no t yet approved or cleared by the [place] FDA and  has been authorized for detection and/or diagnosis of SARS-CoV-2 by FDA under an Emergency Use Authorization (EUA). This EUA will remain  in effect (meaning this test can be used) for the duration of the COVID-19 declaration under Section 564(b)(1) of the Act, 21 U.S.C.section 360bbb-3(b)(1), unless the authorization is terminated  or revoked sooner.       Influenza A by PCR POSITIVE (A) NEGATIVE Final   Influenza B by PCR NEGATIVE NEGATIVE Final    Comment: (NOTE) The Xpert  Xpress SARS-CoV-2/FLU/RSV plus assay is intended as an aid in the diagnosis of influenza from Nasopharyngeal swab specimens and should not be used as a sole basis for treatment. Nasal washings and aspirates are unacceptable for Xpert Xpress SARS-CoV-2/FLU/RSV testing.  Fact Sheet for Patients: BloggerCourse.com  Fact Sheet for Healthcare Providers: SeriousBroker.it  This test is not yet approved or cleared by the [place] FDA and has been authorized for detection and/or diagnosis of SARS-CoV-2 by FDA under an Emergency Use Authorization (EUA). This EUA will remain in effect (meaning this test can be used) for the duration of the COVID-19 declaration under Section 564(b)(1) of the Act, 21 U.S.C. section 360bbb-3(b)(1), unless the authorization is terminated or revoked.  Performed at [facility], [address], [place], [place] [address]   Urine culture     Status: Abnormal   Collection Time: 04/11/20  5:00 PM   Specimen: In/Out Cath Urine  Result Value Ref Range Status   Specimen Description   Final    IN/OUT CATH URINE Performed at [facility], [address] [place], [place] [address]    Special Requests   Final    NONE Performed at [facility], [address] [place], [place] [address]    Culture 10,000  COLONIES/mL ENTEROCOCCUS FAECALIS (A)  Final   Report Status 04/14/2020 FINAL  Final   Organism ID, Bacteria ENTEROCOCCUS FAECALIS (A)  Final      Susceptibility   Enterococcus faecalis - MIC*    AMPICILLIN <=2 SENSITIVE Sensitive     NITROFURANTOIN <=16 SENSITIVE Sensitive     VANCOMYCIN 1 SENSITIVE Sensitive     * 10,000 COLONIES/mL ENTEROCOCCUS FAECALIS  Culture, Urine     Status: None   Collection Time: 04/12/20 10:21 AM   Specimen: Urine, Random  Result Value Ref Range Status   Specimen Description URINE, RANDOM  Final   Special Requests NONE  Final   Culture   Final     NO GROWTH Performed at [facility], [address] [place], [place] [address]    Report Status 04/13/2020 FINAL  Final         Radiology Studies: EP PPM/ICD IMPLANT  Result Date: 04/16/2020 SURGEON:  Loman Brooklyn, MD   PREPROCEDURE DIAGNOSIS:  Cryptogenic Stroke   POSTPROCEDURE DIAGNOSIS:  Cryptogenic Stroke    PROCEDURES:  1. Implantable loop recorder implantation   INTRODUCTION:  Vicki Swanson is a 74 y.o. female with a history of unexplained stroke who presents today for implantable loop implantation.  The patient has had a cryptogenic stroke.  Despite an extensive workup by neurology, no reversible causes have been identified.  she has worn telemetry during which she did not have arrhythmias.  There is significant concern for possible atrial fibrillation as the cause for the patients stroke.  The patient therefore presents today for implantable loop implantation.   DESCRIPTION OF PROCEDURE:  Informed written consent was obtained, and the patient was brought to the electrophysiology lab in a fasting state.  The patient required no sedation for the procedure today.  Mapping over the patient's chest was performed by the EP lab staff to identify the area where electrograms were most prominent for ILR recording.  This area was found to be the left parasternal region over the 3rd-4th intercostal space. The patients left chest was therefore prepped and draped in the usual sterile fashion by the EP lab staff. The skin overlying the left parasternal region was infiltrated with lidocaine for local analgesia.  A 0.5-cm incision was made over the left parasternal region over the 3rd intercostal space.  A subcutaneous ILR pocket was fashioned using a combination of sharp and blunt dissection.  A Medtronic Reveal Linq model [place] [id number] implantable loop recorder was then placed into the pocket  R waves were very prominent and measured 0.36mV. EBL<1 ml.  Steri- Strips and a sterile dressing were  then applied.  There were no early apparent complications.   CONCLUSIONS:  1. Successful implantation of a Medtronic Reveal LINQ implantable loop recorder for cryptogenic stroke  2. No early apparent complications.        Scheduled Meds: . aspirin  325 mg Oral Daily  . atorvastatin  40 mg Oral Daily  . Chlorhexidine Gluconate Cloth  6 each Topical Daily  . clopidogrel  75 mg Oral Daily  . docusate sodium  100 mg Oral BID  . gabapentin  100 mg Oral TID  . guaiFENesin  600 mg Oral BID  . heparin  5,000 Units Subcutaneous Q8H  . pantoprazole  40 mg Oral QAC breakfast  . propranolol  40 mg Oral Daily  . sertraline  100 mg Oral Daily  . topiramate  50 mg Oral BID   Continuous Infusions: . sodium chloride 20 mL/hr  at 04/11/20 1315     LOS: 5 days    Time spent: 25 minutes    Vicki Wisby Arvella Merles, MD [facility] 04/16/2020, 5:34 PM     Please page though AMION or Epic secure chat:  For [employer], Higher education careers adviser

## 2020-04-16 NOTE — Progress Notes (Addendum)
STROKE TEAM PROGRESS NOTE   INTERVAL HISTORY Her husband is at the bedside.  Patient sitting in chair, awake alert, interactive.  She wants to go home.  Loop recorder placed today.  PT/OT recommend home health.  Vitals:   04/15/20 1916 04/15/20 2015 04/15/20 2347 04/16/20 0314  BP: (!) 161/76  (!) 160/81 (!) 167/75  Pulse: 69  63 60  Resp: 17  15 19   Temp: 98.5 F (36.9 C)  98.3 F (36.8 C) 98 F (36.7 C)  TempSrc: Oral Oral Oral Oral  SpO2: 99%  99% 99%  Weight:      Height:       CBC:  Recent Labs  Lab 04/11/20 1250 04/12/20 0456 04/15/20 0414 04/16/20 0346  WBC 9.4   < > 7.5 8.2  NEUTROABS 5.5  --   --   --   HGB 15.9*   < > 11.1* 10.7*  HCT 45.3   < > 30.6* 31.1*  MCV 88.5   < > 89.0 90.4  PLT 231   < > 125* 146*   < > = values in this interval not displayed.   Basic Metabolic Panel:  Recent Labs  Lab 04/15/20 0414 04/16/20 0346  NA 146* 144  K 3.3* 3.8  CL 110 113*  CO2 27 22  GLUCOSE 90 87  BUN 40* 28*  CREATININE 1.68* 1.48*  CALCIUM 7.4* 7.8*  MG 1.2*  --   PHOS 2.7  --    Lipid Panel:  Recent Labs  Lab 04/13/20 1023  CHOL 143  TRIG 159*  HDL 24*  CHOLHDL 6.0  VLDL 32  LDLCALC 87   HgbA1c:  Recent Labs  Lab 04/13/20 1023  HGBA1C 4.9   Urine Drug Screen: No results for input(s): LABOPIA, COCAINSCRNUR, LABBENZ, AMPHETMU, THCU, LABBARB in the last 168 hours.  Alcohol Level No results for input(s): ETH in the last 168 hours.  IMAGING past 24 hours No results found.  PHYSICAL EXAM  Temp:  [98 F (36.7 C)-99.7 F (37.6 C)] 98 F (36.7 C) (12/15 0314) Pulse Rate:  [60-96] 60 (12/15 0314) Resp:  [15-22] 19 (12/15 0314) BP: (147-184)/(75-81) 167/75 (12/15 0314) SpO2:  [96 %-99 %] 99 % (12/15 0314)  General - Well nourished, well developed, in no apparent distress.  Ophthalmologic - fundi not visualized due to noncooperation.  Cardiovascular - Regular rhythm and rate.  Skull - right occipital nerve tenderness and skull  base  Mental Status -  Level of arousal and orientation to months, place, and person were intact, but not to year. Language including expression, naming, repetition, comprehension was assessed and found intact. Fund of Knowledge was assessed and was intact.  Cranial Nerves II - XII - II - Visual field intact OU. III, IV, VI - Extraocular movements intact. V - Facial sensation intact bilaterally. VII - Facial movement intact bilaterally. VIII - Hearing & vestibular intact bilaterally. X - Palate elevates symmetrically. XI - Chin turning & shoulder shrug intact bilaterally. XII - Tongue protrusion intact.  Motor Strength - The patient's strength was 4/5 BUEs and 3-/5 BLE proximal and 4/5 knee flexion and pronator drift was absent.  Bulk was normal and fasciculations were absent.   Motor Tone - Muscle tone was assessed at the neck and appendages and was normal.  Reflexes - The patient's reflexes were symmetrical in all extremities and she had no pathological reflexes.  Sensory - Light touch, temperature/pinprick were assessed and were symmetrical.    Coordination - The patient  had normal movements in the hands with no ataxia or dysmetria.  Tremor was absent.  Gait and Station - deferred.   ASSESSMENT/PLAN Ms. Samarie Pinder is a 74 y.o. female with history of COPD, GERD, HTN presenting to [facility] with weakness/fatigue, confusion and dehydration. Found to have the FLU on admission. Developed transient R facial droop, slurred speech and aphasia in hospital. MRI neg. TIA workup pursued.   TIA in setting of ? AF not on AC vs. large vessel disease  CT head 12/10 No acute abnormality.   Code Stroke CT head 12/11 No acute abnormality. Small vessel disease. Old R lentiform nucleus lacune. ASPECTS 10  MRI  12/11 No acute abnormality. Small vessel disease. Atrophy. Old R lentiform nucleus lacune. Sinus dz.  MRA 12/12 intracranial atherosclerosis: severe cavernous L ICA,  moderate BA tip, moderate to severe B P1 and R V4 stenoses, mild distal L VA, proximal BA, R siphon  Carotid Doppler  R ICA 60-79%  2D Echo EF 65-70%. No source of embolus. RA mildly dilated.  LDL 87  HgbA1c 4.9  VTE prophylaxis - Heparin 5000 units sq tid   aspirin 325 mg daily prior to admission, now on aspirin 325 mg daily and clopidogrel 75 mg daily. Continue DAPT x 3 months then plavix alone given large vessel stenosis  Therapy recommendations:  HH PT/OT  Disposition:  pending   ? atrial fibrillation   Follow with Dr. Desma Maxim for questionable A. Fib  Had EKG and 40-hour Holter monitoring showed frequent PACs and SVT  loop recorder placed by EP  Carotid stenosis, right  CUS right ICA 60-79%  Not able to do CTA due to AKI  Asymptomatic at this time  Consider outpt CTA neck once renal function normalizes  Will follow up with [facility] as outpt   Occipital neuralgia  Right occipital nerve tenderness on compression  Right scalp intermittent tenderness  Consider with right occipital neuralgia, currently improved  Follow-up with outpatient neurology to consider occipital nerve block if needed  No sign of trigeminal neuralgia  DC Tegretol  Continue Neurontin 100 tid  Metabolic encephalopathy due to dehydration, Flu, AKI and UTI  Creatinine 10.72->8.41->4.32->2.06->1.68->1.48  On Tamiflu for + flu  UA WBC 21-50, urine culture showed E. coli  On Rocephin -> finished course  Off IV fluid  Mental status much improved  Continue treatment of underlying conditions  Hypertension  Stable . BP goal normotensive  Hyperlipidemia  Home meds:  pravachol 20  LDL 87, goal < 70  Now on lipitor 40  Continue statin at discharge   Other Stroke Risk Factors  Advanced Age >/= 65   Obesity, Body mass index is 30.01 kg/m., BMI >/= 30 associated with increased stroke risk, recommend weight loss, diet and exercise as appropriate   Other Active  Problems  COPD  GERD  Hypokalemia  Hospital day # 5  Neurology will sign off. Please call with questions. Pt will follow up with stroke clinic NP at [facility] in about 4 weeks. Thanks for the consult.   Marvel Plan, MD PhD Stroke Neurology 04/16/2020 7:16 PM   To contact Stroke Continuity provider, please refer to [email or web address]. After hours, contact General Neurology

## 2020-04-16 NOTE — Progress Notes (Signed)
Physical Therapy Treatment Patient Details Name: Vicki Swanson MRN: [id number] DOB: [birth year] Today's Date: 04/16/2020    History of Present Illness 74 y.o. female with medical history significant of hypertension, COPD, GERD who presented to med [facility] with c/o diarrhea, fever, weakness, and fatigue. Pt admitted with acute influenza infection as well as acute kidney injury. Code stroke called 12/11. MRI negative. Code stroke canceled due to symptoms resolved.    PT Comments    Pt able to perform all bed mob with supervision. Per OT this date, pt was requiring supervision-min guard assist for all transfers and short-distance gait with use of a RW within her room. Planned to perform gait training, but pt reported headache and noted BP at 220s/90s sitting EOB, immediately retook BP sitting EOB with it being 216/85, immediately returned pt to supine and notified RN. Thus, ceased remainder of session. Changed recs to [facility] PT for d/c as pt has improved in mobility independence and safety significantly per OT. Will continue to follow acutely.   Home health PT;Supervision for mobility/OOB     Equipment Recommendations  Rolling walker with 5" wheels;3in1 (PT);Wheelchair (measurements PT);Wheelchair cushion (measurements PT)    Recommendations for Other Services       Precautions / Restrictions Precautions Precautions: Fall Precaution Comments: watch BP Restrictions Weight Bearing Restrictions: No    Mobility  Bed Mobility Overal bed mobility: Modified Independent Bed Mobility: Supine to Sit;Sit to Supine     Supine to sit: Supervision;HOB elevated Sit to supine: Supervision   General bed mobility comments: HOB elevated with supervision and use of bed rails to come to sit. Sit > supine with supervision. Extra time to transition superiorly in bed with supervision.  Transfers Overall transfer level: Needs assistance Equipment used: Rolling walker (2 wheeled) Transfers: Sit  to/from [employer] Sit to Stand: Supervision Stand pivot transfers: Min guard;Supervision       General transfer comment: Deferred due to HTN  Ambulation/Gait             General Gait Details: Deferred due to HTN   Stairs             Wheelchair Mobility    Modified Rankin (Stroke Patients Only)       Balance Overall balance assessment: Needs assistance Sitting-balance support: Feet supported Sitting balance-Leahy Scale: Good Sitting balance - Comments: Static sitting EOB safely no LOB with supervision.   Standing balance support: Bilateral upper extremity supported Standing balance-Leahy Scale: Fair Standing balance comment: Deferred due to HTN.                            Cognition Arousal/Alertness: Awake/alert Behavior During Therapy: WFL for tasks assessed/performed Overall Cognitive Status: Within Functional Limits for tasks assessed                                 General Comments: Demonstrates fair safety awareness.      Exercises      General Comments General comments (skin integrity, edema, etc.): BP 220s/90s sitting EOB, immediately retook BP sitting EOB with it being 216/85, immediately returned pt to supine and notified RN      Pertinent Vitals/Pain Pain Assessment: Faces Faces Pain Scale: Hurts a little bit Pain Location: headache Pain Descriptors / Indicators: Discomfort Pain Intervention(s): Limited activity within patient's tolerance;Monitored during session;Repositioned    Home Living  Prior Function            PT Goals (current goals can now be found in the care plan section) Acute Rehab PT Goals Patient Stated Goal: to go home PT Goal Formulation: With patient/family Time For Goal Achievement: 04/27/20 Potential to Achieve Goals: Good Progress towards PT goals: Progressing toward goals    Frequency    Min 3X/week      PT Plan Discharge plan  needs to be updated    Co-evaluation              AM-PAC PT "6 Clicks" Mobility   Outcome Measure  Help needed turning from your back to your side while in a flat bed without using bedrails?: A Little Help needed moving from lying on your back to sitting on the side of a flat bed without using bedrails?: A Little Help needed moving to and from a bed to a chair (including a wheelchair)?: A Little Help needed standing up from a chair using your arms (e.g., wheelchair or bedside chair)?: A Little Help needed to walk in hospital room?: A Little Help needed climbing 3-5 steps with a railing? : A Lot 6 Click Score: 17    End of Session Equipment Utilized During Treatment: Gait belt Activity Tolerance: Treatment limited secondary to medical complications (Comment) (HTN) Patient left: in bed;with call bell/phone within reach;with bed alarm set Nurse Communication: Mobility status;Other (comment) (BP) PT Visit Diagnosis: Other abnormalities of gait and mobility (R26.89);Muscle weakness (generalized) (M62.81);Pain;Unsteadiness on feet (R26.81) Pain - Right/Left:  (headache) Pain - part of body:  (headache)     Time: 7829-5621 PT Time Calculation (min) (ACUTE ONLY): 15 min  Charges:  $Therapeutic Activity: 8-22 mins                     Raymond Gurney, PT, DPT Acute Rehabilitation Services  Pager: [phone] Office: [phone]    Jewel Baize 04/16/2020, 5:05 PM

## 2020-04-16 NOTE — Progress Notes (Addendum)
OT Cancellation Note  Patient Details Name: Takya Vandivier MRN: [id number] DOB: [birth year]   Cancelled Treatment:    Reason Eval/Treat Not Completed: Patient at procedure or test/ unavailable. OT will check back as time allows.   Kallie Edward OTR/L Supplemental OT, Department of rehab services [phone]  Yvette Roark R H. 04/16/2020, 1:02 PM

## 2020-04-16 NOTE — Progress Notes (Signed)
SLP Cancellation Note  Patient Details Name: Vicki Swanson MRN: [id number] DOB: 1945/11/28   Cancelled treatment:       Reason Eval/Treat Not Completed: Patient at procedure or test/unavailable   Setsuko Robins, Riley Nearing 04/16/2020, 1:09 PM

## 2020-04-16 NOTE — Consult Note (Addendum)
ELECTROPHYSIOLOGY CONSULT NOTE  Patient ID: Vicki Swanson MRN: [id number], DOB/AGE: [birth year]   Admit date: 04/11/2020 Date of Consult: 04/16/2020  Primary Physician: Oneita Hurt, No Primary Cardiologist: No primary care provider on file.  Primary Electrophysiologist: New to Dr. Elberta Fortis Reason for Consultation: Cryptogenic stroke; recommendations regarding Implantable Loop Recorder Insurance: [facility] Medicare  History of Present Illness EP has been asked to evaluate Dalphine Handing for placement of an implantable loop recorder to monitor for atrial fibrillation by Dr Roda Shutters.  The patient was admitted on 04/11/2020 with weakness, fatigue, confusion and dehydration. Flu +. She developed transient R facial droop, slurred speech, and aphasia in the hospital.    She has undergone workup for stroke including:  CT head 12/10 No acute abnormality.   Code Stroke CT head 12/11 No acute abnormality. Small vessel disease. Old R lentiform nucleus lacune. ASPECTS 10  MRI  12/11 No acute abnormality. Small vessel disease. Atrophy. Old R lentiform nucleus lacune. Sinus dz.  MRA 12/12 intracranial atherosclerosis: severe cavernous L ICA, moderate BA tip, moderate to severe B P1 and R V4 stenoses, mild distal L VA, proximal BA, R siphon  Carotid Doppler  R ICA 60-79%  May consider CTA head and neck if creatinine further improves  2D Echo EF 65-70%. No source of embolus. RA mildly dilated.  The patient has been monitored on telemetry which has demonstrated sinus rhythm with no arrhythmias.  Inpatient stroke work-up [place] not require a TEE per Neurology.   Echocardiogram this admission demonstrated normal EF and no source of embolus as above.  Lab work is reviewed. AKI has resolved  Prior to admission, the patient denies chest pain, shortness of breath, dizziness, palpitations, or syncope.  She is recovering from her stroke with current plans to rehab at SNF at discharge.  Past Medical History:  Diagnosis Date   . COPD (chronic obstructive pulmonary disease) (HCC)   . GERD (gastroesophageal reflux disease)   . Hypertension      Surgical History:  Past Surgical History:  Procedure Laterality Date  . ABDOMINAL HYSTERECTOMY    . BREAST SURGERY    . CHOLECYSTECTOMY    . TONSILLECTOMY       Medications Prior to Admission  Medication Sig Dispense Refill Last Dose  . ascorbic acid (VITAMIN C) 500 MG tablet Take 500 mg by mouth daily.     Marland Kitchen aspirin 325 MG tablet Take 325 mg by mouth daily.     . B Complex Vitamins (VITAMIN B COMPLEX) TABS Take 1 tablet by mouth daily.     Marland Kitchen gabapentin (NEURONTIN) 300 MG capsule Take 300 mg by mouth daily.     Marland Kitchen lisinopril (ZESTRIL) 20 MG tablet Take 20 mg by mouth daily.     . pantoprazole (PROTONIX) 40 MG tablet Take 40 mg by mouth daily before breakfast.     . potassium chloride SA (KLOR-CON) 20 MEQ tablet Take 20 mEq by mouth daily.     . pravastatin (PRAVACHOL) 20 MG tablet Take 20 mg by mouth every evening.     . propranolol (INDERAL) 40 MG tablet Take 40 mg by mouth daily.     . sertraline (ZOLOFT) 100 MG tablet Take 100 mg by mouth daily.     Marland Kitchen topiramate (TOPAMAX) 25 MG tablet Take 50 mg by mouth 2 (two) times daily.       Inpatient Medications:  . aspirin  325 mg Oral Daily  . atorvastatin  40 mg Oral Daily  . Chlorhexidine Gluconate Cloth  6 each Topical Daily  . clopidogrel  75 mg Oral Daily  . docusate sodium  100 mg Oral BID  . gabapentin  100 mg Oral TID  . guaiFENesin  600 mg Oral BID  . heparin  5,000 Units Subcutaneous Q8H  . pantoprazole  40 mg Oral QAC breakfast  . propranolol  40 mg Oral Daily  . sertraline  100 mg Oral Daily  . topiramate  50 mg Oral BID    Allergies: No Known Allergies  Social History   Socioeconomic History  . Marital status: Married    Spouse name: Not on file  . Number of children: Not on file  . Years of education: Not on file  . Highest education level: Not on file  Occupational History  . Not on  file  Tobacco Use  . Smoking status: Never Smoker  . Smokeless tobacco: Never Used  Substance and Sexual Activity  . Alcohol use: Never  . Drug use: Never  . Sexual activity: Not on file  Other Topics Concern  . Not on file  Social History Narrative  . Not on file   Social Determinants of Health   Financial Resource Strain: Not on file  Food Insecurity: Not on file  Transportation Needs: Not on file  Physical Activity: Not on file  Stress: Not on file  Social Connections: Not on file  Intimate Partner Violence: Not on file     No family history on file.    Review of Systems: All other systems reviewed and are otherwise negative except as noted above.  Physical Exam: Vitals:   04/15/20 2015 04/15/20 2347 04/16/20 0314 04/16/20 0830  BP:  (!) 160/81 (!) 167/75 (!) 199/78  Pulse:  63 60   Resp:  15 19   Temp:  98.3 F (36.8 C) 98 F (36.7 C) 98.2 F (36.8 C)  TempSrc: Oral Oral Oral Axillary  SpO2:  99% 99%   Weight:      Height:        GEN- The patient is well appearing, alert and oriented x 3 today.   Head- normocephalic, atraumatic Eyes-  Sclera clear, conjunctiva pink Ears- hearing intact Oropharynx- clear Neck- supple Lungs- Clear to ausculation bilaterally, normal work of breathing Heart- Regular rate and rhythm, no murmurs, rubs or gallops  GI- soft, NT, ND, + BS Extremities- no clubbing, cyanosis, or edema MS- no significant deformity or atrophy Skin- no rash or lesion Psych- euthymic mood, full affect   Labs:   Lab Results  Component Value Date   WBC 8.2 04/16/2020   HGB 10.7 (L) 04/16/2020   HCT 31.1 (L) 04/16/2020   MCV 90.4 04/16/2020   PLT 146 (L) 04/16/2020    Recent Labs  Lab 04/12/20 0456 04/13/20 0411 04/16/20 0346  NA 139   < > 144  K 4.0   < > 3.8  CL 115*   < > 113*  CO2 10*   < > 22  BUN 95*   < > 28*  CREATININE 8.41*   < > 1.48*  CALCIUM 8.0*   < > 7.8*  PROT 5.6*  --   --   BILITOT 0.4  --   --   ALKPHOS 68  --    --   ALT 9  --   --   AST 11*  --   --   GLUCOSE 121*   < > 87   < > = values in this interval not displayed.  Radiology/Studies: DG Abd 1 View  Result Date: 04/13/2020 CLINICAL DATA:  Metal implant.  MRI. EXAM: ABDOMEN - 1 VIEW COMPARISON:  None. FINDINGS: There are metallic clips in the right upper quadrant likely related to prior cholecystectomy. These are not a contraindication to MRI. 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 The bowel gas pattern is nonobstructive. IMPRESSION: Metallic clips in the right upper quadrant likely related to prior cholecystectomy. These are not a contraindication to MRI. Nonobstructive bowel gas pattern. Electronically Signed   By: Katherine Mantle M.D.   On: 04/13/2020 00:03   CT HEAD WO CONTRAST  Result Date: 04/11/2020 CLINICAL DATA:  Confusion for a week. Diarrhea last weekend. Weakness. EXAM: CT HEAD WITHOUT CONTRAST TECHNIQUE: Contiguous axial images were obtained from the base of the skull through the vertex without intravenous contrast. COMPARISON:  Head CT, 06/07/2016 FINDINGS: Brain: No evidence of acute infarction, hemorrhage, hydrocephalus, extra-axial collection or mass lesion/mass effect. Small area of lucency in the right basal ganglia consistent with a old lacunar infarct. Minor areas of white matter hypoattenuation are noted bilaterally consistent with chronic microvascular ischemic change. Vascular: No hyperdense vessel or unexpected calcification. Skull: Normal. Negative for fracture or focal lesion. Sinuses/Orbits: Globes and orbits are unremarkable. Visualized sinuses are clear. Other: None. IMPRESSION: 1. No acute intracranial abnormalities. Electronically Signed   By: Amie Portland M.D.   On: 04/11/2020 13:49   MR ANGIO HEAD WO CONTRAST  Result Date: 04/13/2020 CLINICAL DATA:  74 year old female code stroke presentation yesterday. No acute finding  on MRI. EXAM: MRA HEAD WITHOUT CONTRAST TECHNIQUE: Angiographic images of the Circle of Willis were obtained using MRA technique without intravenous contrast. COMPARISON:  Brain MRI 0004 hours today and earlier. FINDINGS: No intracranial mass effect or ventriculomegaly. Antegrade flow in the posterior circulation. Dominant appearing distal left vertebral artery and moderate to severe stenosis of the right vertebral V4 segment (series 1055, image 9). The right AICA appears to be dominant and is patent. Mild irregularity and stenosis of the left V4 segment just upstream of the normal left PICA origin. Patent vertebrobasilar junction. Mild proximal basilar artery stenosis. More moderate irregularity and stenosis of the basilar tip, but the SCA and PCA origins remain patent. Superimposed moderate to severe left and severe short segment right PCA P1 stenoses. Bilateral PCA branches remain patent although are mildly irregular, more so the left. Antegrade flow in both ICA siphons with artifacts suspected on the lowest image. Both siphons are patent although irregular and there is evidence of severe left siphon stenosis in the cavernous segment (flow gap which also corresponds to an area of flow void irregularity on the earlier MRI). On the right side sign some only mild siphon stenosis is suspected. Both carotid termini remain patent. MCA and ACA origins are within normal limits. Visible ACA branches are within normal limits. MCA M1 segments and MCA bi/trifurcations are within normal limits. Visible bilateral MCA branches are mildly irregular. IMPRESSION: 1. Evidence of severe intracranial atherosclerosis, most notably: - Severe stenosis Left ICA cavernous segment. - up to Moderate stenosis at the Basilar tip, with moderate to Severe bilateral PCA P1 stenoses. - Moderate to severe Right Vertebral V4 segment stenosis. 2. Additional mild stenoses including distal Left vertebral artery, proximal Basilar artery, Right siphon.  3. Neurointerventional Radiology consultation would be valuable. Electronically Signed   By: Odessa Fleming M.D.   On: 04/13/2020 13:48   MR BRAIN WO CONTRAST  Result Date: 04/13/2020 CLINICAL DATA:  Initial evaluation for neuro deficit, stroke suspected. EXAM: MRI HEAD WITHOUT CONTRAST TECHNIQUE: Multiplanar, multiecho pulse sequences of the brain and surrounding structures were obtained without intravenous contrast. COMPARISON:  Prior CT from 04/12/2020. FINDINGS: Brain: Diffuse prominence of the CSF containing spaces compatible with generalized age-related cerebral atrophy. Mild scattered patchy T2/FLAIR hyperintensity within the periventricular and deep white matter both cerebral hemispheres as well as the pons, most likely related chronic microvascular ischemic disease, mild for age. 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 No abnormal foci of restricted diffusion to suggest acute or subacute ischemia. Gray-white matter differentiation maintained. No encephalomalacia to suggest chronic cortical infarction. No foci of susceptibility artifact to suggest acute or chronic intracranial hemorrhage. No mass lesion, midline shift or mass effect. No hydrocephalus or extra-axial fluid collection. Pituitary gland suprasellar region normal. Midline structures intact. Vascular: Major intracranial vascular flow voids are maintained. Skull and upper cervical spine: Craniocervical junction within normal limits. Bone marrow signal intensity normal. No scalp soft tissue abnormality. Sinuses/Orbits: Patient status post bilateral ocular lens replacement. Globes and orbital soft tissues demonstrate no acute finding. Mild scattered mucosal thickening noted within the ethmoidal air cells, sphenoid sinuses, and maxillary sinuses. Air-fluid level within the left sphenoid sinus suggestive of acute sinusitis. No significant mastoid effusion. Inner ear structures normal. Other: None. IMPRESSION: 1. No acute  intracranial abnormality. 2. Age-related cerebral atrophy with mild chronic small vessel ischemic disease. 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 3. Air-fluid level within the left sphenoid sinus suggestive of acute sinusitis. Electronically Signed   By: Rise Mu M.D.   On: 04/13/2020 00:30   US RENAL  Result Date: 04/12/2020 CLINICAL DATA:  Acute renal failure EXAM: RENAL / URINARY TRACT ULTRASOUND COMPLETE COMPARISON:  None. FINDINGS: Right Kidney: Renal measurements: 9.1 x 4.6 x 4.7 cm = volume: 102 mL. Echogenicity appears within normal limits. There is mild right hydronephrosis. There is a 0.3 cm echogenic focus which is nonspecific but possibly represents a small stone. No renal mass. Left Kidney: Renal measurements: 8.8 x 4.7 x 4.2 cm = volume: 90 mL. Echogenicity appears within normal limits. No hydronephrosis. No renal calculus. No renal mass identified. Bladder: Decompressed with Foley catheter in place. Other: None. IMPRESSION: 1. Mild right kidney hydronephrosis. There is a 0.3 cm echogenic focus in the right kidney which could represent a small stone. 2.  Unremarkable appearance of the left kidney. Electronically Signed   By: Emmaline Kluver M.D.   On: 04/12/2020 11:04   DG Chest Port 1 View  Result Date: 04/11/2020 CLINICAL DATA:  Questionable sepsis EXAM: PORTABLE CHEST 1 VIEW COMPARISON:  None. FINDINGS: The lungs are clear and negative for focal airspace consolidation, pulmonary edema or suspicious pulmonary nodule. No pleural effusion or pneumothorax. Cardiac and mediastinal contours are within normal limits. No acute fracture or lytic or blastic osseous lesions. The visualized upper abdominal bowel gas pattern is unremarkable. IMPRESSION: No active disease. Electronically Signed   By: Malachy Moan M.D.   On: 04/11/2020 13:48   ECHOCARDIOGRAM COMPLETE  Result Date: 04/14/2020    ECHOCARDIOGRAM REPORT   Patient Name:   AZYRIA OSMON  Date of Exam: 04/14/2020 Medical Rec #:  [id number]    Height:       60.0 in Accession #:    [id number]   Weight:       153.7 lb Date of Birth:  27-Jul-1945    BSA:  1.669 m Patient Age:    74 years     BP:           169/64 mmHg Patient Gender: F            HR:           48 bpm. Exam Location:  Inpatient Procedure: 2D Echo, Color Doppler and Cardiac Doppler Indications:    Stroke i163.9  History:        Patient has prior history of Echocardiogram examinations, most                 recent 09/19/2019. COPD, Arrythmias:Atrial Fibrillation; Risk                 Factors:Hypertension. Prior performed at [facility].  Sonographer:    Irving Burton Senior RDCS Referring Phys: [id number] DESIREE METZGER-CIHELKA  Sonographer Comments: Suboptimal parasternal window due to COPD IMPRESSIONS  1. Left ventricular ejection fraction, by estimation, is 65 to 70%. The left ventricle has normal function. The left ventricle has no regional wall motion abnormalities. There is mild left ventricular hypertrophy. Left ventricular diastolic parameters are consistent with Grade II diastolic dysfunction (pseudonormalization).  2. Right ventricular systolic function is normal. The right ventricular size is normal. Tricuspid regurgitation signal is inadequate for assessing PA pressure.  3. Left atrial size was mildly dilated.  4. Right atrial size was mildly dilated.  5. The mitral valve is normal in structure. Trivial mitral valve regurgitation. No evidence of mitral stenosis.  6. The aortic valve is tricuspid. Aortic valve regurgitation is not visualized. Mild aortic valve sclerosis is present, with no evidence of aortic valve stenosis.  7. The inferior vena cava is normal in size with greater than 50% respiratory variability, suggesting right atrial pressure of 3 mmHg. FINDINGS  Left Ventricle: Left ventricular ejection fraction, by estimation, is 65 to 70%. The left ventricle has normal function. The left ventricle has no regional wall motion  abnormalities. The left ventricular internal cavity size was normal in size. There is  mild left ventricular hypertrophy. Left ventricular diastolic parameters are consistent with Grade II diastolic dysfunction (pseudonormalization). Right Ventricle: The right ventricular size is normal. No increase in right ventricular wall thickness. Right ventricular systolic function is normal. Tricuspid regurgitation signal is inadequate for assessing PA pressure. Left Atrium: Left atrial size was mildly dilated. Right Atrium: Right atrial size was mildly dilated. Pericardium: Trivial pericardial effusion is present. Mitral Valve: The mitral valve is normal in structure. There is mild thickening of the mitral valve leaflet(s). Trivial mitral valve regurgitation. No evidence of mitral valve stenosis. Tricuspid Valve: The tricuspid valve is normal in structure. Tricuspid valve regurgitation is not demonstrated. Aortic Valve: The aortic valve is tricuspid. Aortic valve regurgitation is not visualized. Mild aortic valve sclerosis is present, with no evidence of aortic valve stenosis. Pulmonic Valve: The pulmonic valve was normal in structure. Pulmonic valve regurgitation is not visualized. Aorta: The aortic root is normal in size and structure. Venous: The inferior vena cava is normal in size with greater than 50% respiratory variability, suggesting right atrial pressure of 3 mmHg. IAS/Shunts: No atrial level shunt detected by color flow Doppler.  LEFT VENTRICLE PLAX 2D LVIDd:         3.50 cm  Diastology LVIDs:         2.20 cm  LV e' medial:    6.42 cm/s LV PW:         1.20 cm  LV E/e' medial:  17.8 LV IVS:  1.10 cm  LV e' lateral:   6.64 cm/s LVOT diam:     1.80 cm  LV E/e' lateral: 17.2 LV SV:         75 LV SV Index:   45 LVOT Area:     2.54 cm  RIGHT VENTRICLE RV S prime:     14.10 cm/s TAPSE (M-mode): 2.3 cm LEFT ATRIUM             Index       RIGHT ATRIUM           Index LA diam:        2.90 cm 1.74 cm/m  RA Area:      11.00 cm LA Vol (A2C):   68.2 ml 40.87 ml/m RA Volume:   22.20 ml  13.30 ml/m LA Vol (A4C):   41.9 ml 25.11 ml/m LA Biplane Vol: 56.2 ml 33.68 ml/m  AORTIC VALVE LVOT Vmax:   127.00 cm/s LVOT Vmean:  84.700 cm/s LVOT VTI:    0.295 m  AORTA Ao Root diam: 2.50 cm MITRAL VALVE MV Area (PHT): 2.42 cm     SHUNTS MV Decel Time: 313 msec     Systemic VTI:  0.30 m MV E velocity: 114.00 cm/s  Systemic Diam: 1.80 cm MV A velocity: 77.80 cm/s MV E/A ratio:  1.47 Marca Anconaalton Mclean MD Electronically signed by Marca Anconaalton Mclean MD Signature Date/Time: 04/14/2020/2:20:17 PM    Final    CT HEAD CODE STROKE WO CONTRAST  Result Date: 04/12/2020 CLINICAL DATA:  Code stroke. Initial evaluation for acute facial droop, aphasia. Of EXAM: CT HEAD WITHOUT CONTRAST TECHNIQUE: Contiguous axial images were obtained from the base of the skull through the vertex without intravenous contrast. COMPARISON:  None. FINDINGS: Brain: Cerebral volume within normal limits for age. Mild chronic microvascular ischemic disease noted within the periventricular white matter. 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 No acute intracranial hemorrhage. No acute large vessel territory infarct. No mass lesion, midline shift or mass effect. No hydrocephalus or extra-axial fluid collection. Vascular: No hyperdense vessel. Mild calcified atherosclerosis at the skull base. Skull: Scalp soft tissues and calvarium within normal limits. Sinuses/Orbits: Globes and orbital soft tissues are normal. Mild scattered mucosal thickening noted within the paranasal sinuses, with small volume opacity within the left sphenoid sinus. Mastoid air cells and middle ear cavities are clear. Other: None. ASPECTS [facility]([place] Stroke Program Early CT Score) - Ganglionic level infarction (caudate, lentiform nuclei, internal capsule, insula, M1-M3 cortex): 7 - Supraganglionic infarction (M4-M6 cortex): 3 Total score (0-10 with 10 being normal): 10 IMPRESSION: 1. No  acute intracranial abnormality. 2. ASPECTS is 10. 3. 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 These results were communicated to Dr. Thomasena Edisollins at 10:30 pmon 12/11/2021by text page via the [facility] messaging system. Electronically Signed   By: Rise MuBenjamin  McClintock M.D.   On: 04/12/2020 22:32   VAS US CAROTID  Result Date: 04/14/2020 Carotid Arterial Duplex Study Indications:       TIA. Risk Factors:      Hypertension. Comparison Study:  No prior studies. Performing Technologist: Jean Rosenthalachel Hodge RDMS  Examination Guidelines: A complete evaluation includes B-mode imaging, spectral Doppler, color Doppler, and power Doppler as needed of all accessible portions of each vessel. Bilateral testing is considered an integral part of a complete examination. Limited examinations for reoccurring indications may be performed as noted.  Right Carotid Findings: +----------+--------+--------+--------+-----------------------+--------+  PSV cm/sEDV cm/sStenosisPlaque Description     Comments +----------+--------+--------+--------+-----------------------+--------+ CCA Prox  123     22                                              +----------+--------+--------+--------+-----------------------+--------+ CCA Distal97      24                                              +----------+--------+--------+--------+-----------------------+--------+ ICA Prox  265     99      60-79%  heterogenous and smooth         +----------+--------+--------+--------+-----------------------+--------+ ICA Mid   287     101     60-79%                                  +----------+--------+--------+--------+-----------------------+--------+ ICA Distal206     59                                              +----------+--------+--------+--------+-----------------------+--------+ ECA       134                                                      +----------+--------+--------+--------+-----------------------+--------+ +----------+--------+-------+----------------+-------------------+           PSV cm/sEDV cmsDescribe        Arm Pressure (mmHG) +----------+--------+-------+----------------+-------------------+ [id number]            Multiphasic, WNL                    +----------+--------+-------+----------------+-------------------+ +---------+--------+--+--------+--+---------+ VertebralPSV cm/s91EDV cm/s14Antegrade +---------+--------+--+--------+--+---------+  Left Carotid Findings: +----------+--------+--------+--------+------------------+--------+           PSV cm/sEDV cm/sStenosisPlaque DescriptionComments +----------+--------+--------+--------+------------------+--------+ CCA Prox  132     30                                         +----------+--------+--------+--------+------------------+--------+ CCA Distal118     34                                         +----------+--------+--------+--------+------------------+--------+ ICA Prox  92      24      1-39%                              +----------+--------+--------+--------+------------------+--------+ ICA Distal194     57                                tortuous +----------+--------+--------+--------+------------------+--------+ ECA       122                                                +----------+--------+--------+--------+------------------+--------+ +----------+--------+--------+----------------+-------------------+  PSV cm/sEDV cm/sDescribe        Arm Pressure (mmHG) +----------+--------+--------+----------------+-------------------+ [id number]             Multiphasic, WNL                    +----------+--------+--------+----------------+-------------------+ +---------+--------+---+--------+--+---------+ VertebralPSV cm/s108EDV cm/s30Antegrade +---------+--------+---+--------+--+---------+   Summary:  Right Carotid: 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 Left Carotid: Velocities in the left ICA are consistent with a 1-39% stenosis. Vertebrals:  Bilateral vertebral arteries demonstrate antegrade flow. Subclavians: Normal flow hemodynamics were seen in bilateral subclavian              arteries. *See table(s) above for measurements and observations.  Electronically signed by Fabienne Bruns MD on 04/14/2020 at 7:40:14 PM.    Final     12-lead ECG shows sinus bradycardia at 59 bpm with PACs (personally reviewed) No prior EKG's in EP[facility] to compare.   Telemetry NSR 60s with PACs (personally reviewed)  Assessment and Plan:  1. Cryptogenic stroke The patient presents with cryptogenic stroke.  The patient does not have a TEE planned for this AM.  I spoke at length with the patient about monitoring for afib with an implantable loop recorder.  Risks, benefits, and alteratives to implantable loop recorder were discussed with the patient today.   At this time, the patient is very clear in their decision to proceed with implantable loop recorder.   2. Marked AKI Improved.   3. Flu + Has improved with supportive care. Afebrile.  4. Encephalopathy Resolved.   Wound care was reviewed with the patient (keep incision clean and dry for 3 days).  Wound check scheduled and entered in AVS. Please call with questions.   Graciella Freer, PA-C 04/16/2020 10:18 AM  I have seen and examined this patient with Otilio Saber.  Agree with above, note added to reflect my findings.  On exam, RRR, no murmurs.  Patient presented to the hospital with cryptogenic stroke. To date, no cause has been found. Sausha Raymond plan for LINQ monitor to look for atrial fibrillation. Risks and benefits discussed. Risks include but not limited to bleeding and infection. The patient understands the risks and has agreed to the procedure.  Catera Hankins M. Namiko Pritts MD 04/16/2020 11:29 AM

## 2020-04-17 LAB — CBC
HCT: 33.1 % — ABNORMAL LOW (ref 36.0–46.0)
Hemoglobin: 11 g/dL — ABNORMAL LOW (ref 12.0–15.0)
MCH: 30.5 pg (ref 26.0–34.0)
MCHC: 33.2 g/dL (ref 30.0–36.0)
MCV: 91.7 fL (ref 80.0–100.0)
Platelets: 167 10*3/uL (ref 150–400)
RBC: 3.61 MIL/uL — ABNORMAL LOW (ref 3.87–5.11)
RDW: 13.2 % (ref 11.5–15.5)
WBC: 10.3 10*3/uL (ref 4.0–10.5)
nRBC: 0 % (ref 0.0–0.2)

## 2020-04-17 LAB — RENAL FUNCTION PANEL
Albumin: 2.5 g/dL — ABNORMAL LOW (ref 3.5–5.0)
Anion gap: 11 (ref 5–15)
BUN: 23 mg/dL (ref 8–23)
CO2: 20 mmol/L — ABNORMAL LOW (ref 22–32)
Calcium: 7.9 mg/dL — ABNORMAL LOW (ref 8.9–10.3)
Chloride: 113 mmol/L — ABNORMAL HIGH (ref 98–111)
Creatinine, Ser: 1.35 mg/dL — ABNORMAL HIGH (ref 0.44–1.00)
GFR, Estimated: 41 mL/min — ABNORMAL LOW (ref >60)
Glucose, Bld: 88 mg/dL (ref 70–99)
Phosphorus: 2.5 mg/dL (ref 2.5–4.6)
Potassium: 3.7 mmol/L (ref 3.5–5.1)
Sodium: 144 mmol/L (ref 135–145)

## 2020-04-17 MED ORDER — GABAPENTIN 100 MG PO CAPS: 100.0000 mg | ORAL_CAPSULE | Freq: Three times a day (TID) | ORAL | 0 refills | Status: DC

## 2020-04-17 MED ORDER — ATORVASTATIN CALCIUM 40 MG PO TABS: 40.0000 mg | ORAL_TABLET | Freq: Every evening | ORAL | 0 refills | Status: DC

## 2020-04-17 MED ORDER — ASPIRIN 325 MG PO TABS: 325.0000 mg | ORAL_TABLET | Freq: Every day | ORAL | 0 refills | Status: DC

## 2020-04-17 MED ORDER — CLOPIDOGREL BISULFATE 75 MG PO TABS: 75.0000 mg | ORAL_TABLET | Freq: Every day | ORAL | 0 refills | Status: DC

## 2020-04-17 NOTE — Plan of Care (Signed)
Problem: Education: Goal: Knowledge of General Education information will improve Description: Including pain rating scale, medication(s)/side effects and non-pharmacologic comfort measures 04/17/2020 1443 by Myriam Forehand, RN Outcome: Completed/Met 04/17/2020 1442 by Myriam Forehand, RN Outcome: Adequate for Discharge 04/17/2020 1442 by Myriam Forehand, RN Outcome: Adequate for Discharge   Problem: Health Behavior/Discharge Planning: Goal: Ability to manage health-related needs will improve 04/17/2020 1443 by Myriam Forehand, RN Outcome: Completed/Met 04/17/2020 1442 by Myriam Forehand, RN Outcome: Adequate for Discharge 04/17/2020 1442 by Myriam Forehand, RN Outcome: Adequate for Discharge   Problem: Clinical Measurements: Goal: Ability to maintain clinical measurements within normal limits will improve 04/17/2020 1443 by Myriam Forehand, RN Outcome: Completed/Met 04/17/2020 1442 by Myriam Forehand, RN Outcome: Adequate for Discharge 04/17/2020 1442 by Myriam Forehand, RN Outcome: Adequate for Discharge Goal: Will remain free from infection 04/17/2020 1443 by Myriam Forehand, RN Outcome: Completed/Met 04/17/2020 1442 by Myriam Forehand, RN Outcome: Adequate for Discharge 04/17/2020 1442 by Myriam Forehand, RN Outcome: Adequate for Discharge Goal: Diagnostic test results will improve 04/17/2020 1443 by Myriam Forehand, RN Outcome: Completed/Met 04/17/2020 1442 by Myriam Forehand, RN Outcome: Adequate for Discharge 04/17/2020 1442 by Myriam Forehand, RN Outcome: Adequate for Discharge Goal: Respiratory complications will improve 04/17/2020 1443 by Myriam Forehand, RN Outcome: Completed/Met 04/17/2020 1442 by Myriam Forehand, RN Outcome: Adequate for Discharge 04/17/2020 1442 by Myriam Forehand, RN Outcome: Adequate for Discharge Goal: Cardiovascular complication will be avoided 04/17/2020 1443 by Myriam Forehand, RN Outcome: Completed/Met 04/17/2020 1442 by Myriam Forehand, RN Outcome: Adequate for  Discharge 04/17/2020 1442 by Myriam Forehand, RN Outcome: Adequate for Discharge   Problem: Activity: Goal: Risk for activity intolerance will decrease 04/17/2020 1443 by Myriam Forehand, RN Outcome: Completed/Met 04/17/2020 1442 by Myriam Forehand, RN Outcome: Adequate for Discharge 04/17/2020 1442 by Myriam Forehand, RN Outcome: Adequate for Discharge   Problem: Nutrition: Goal: Adequate nutrition will be maintained 04/17/2020 1443 by Myriam Forehand, RN Outcome: Completed/Met 04/17/2020 1442 by Myriam Forehand, RN Outcome: Adequate for Discharge 04/17/2020 1442 by Myriam Forehand, RN Outcome: Adequate for Discharge   Problem: Coping: Goal: Level of anxiety will decrease 04/17/2020 1443 by Myriam Forehand, RN Outcome: Completed/Met 04/17/2020 1442 by Myriam Forehand, RN Outcome: Adequate for Discharge 04/17/2020 1442 by Myriam Forehand, RN Outcome: Adequate for Discharge   Problem: Elimination: Goal: Will not experience complications related to bowel motility 04/17/2020 1443 by Myriam Forehand, RN Outcome: Completed/Met 04/17/2020 1442 by Myriam Forehand, RN Outcome: Adequate for Discharge 04/17/2020 1442 by Myriam Forehand, RN Outcome: Adequate for Discharge Goal: Will not experience complications related to urinary retention 04/17/2020 1443 by Myriam Forehand, RN Outcome: Completed/Met 04/17/2020 1442 by Myriam Forehand, RN Outcome: Adequate for Discharge 04/17/2020 1442 by Myriam Forehand, RN Outcome: Adequate for Discharge   Problem: Pain Managment: Goal: General experience of comfort will improve 04/17/2020 1443 by Myriam Forehand, RN Outcome: Completed/Met 04/17/2020 1442 by Myriam Forehand, RN Outcome: Adequate for Discharge 04/17/2020 1442 by Myriam Forehand, RN Outcome: Adequate for Discharge   Problem: Safety: Goal: Ability to remain free from injury will improve 04/17/2020 1443 by Myriam Forehand, RN Outcome: Completed/Met 04/17/2020 1442 by Myriam Forehand, RN Outcome: Adequate for  Discharge 04/17/2020 1442 by Myriam Forehand, RN Outcome: Adequate for Discharge   Problem: Skin Integrity: Goal: Risk for impaired skin integrity will decrease 04/17/2020 1443 by Myriam Forehand, RN Outcome: Completed/Met 04/17/2020 1442 by Myriam Forehand, RN Outcome: Adequate for Discharge 04/17/2020 1442 by Myriam Forehand, RN Outcome: Adequate for Discharge   Problem: Education: Goal: Knowledge of disease or condition will improve 04/17/2020  1443 by Myriam Forehand, RN Outcome: Completed/Met 04/17/2020 1442 by Myriam Forehand, RN Outcome: Adequate for Discharge 04/17/2020 1442 by Myriam Forehand, RN Outcome: Adequate for Discharge Goal: Knowledge of secondary prevention will improve 04/17/2020 1443 by Myriam Forehand, RN Outcome: Completed/Met 04/17/2020 1442 by Myriam Forehand, RN Outcome: Adequate for Discharge 04/17/2020 1442 by Myriam Forehand, RN Outcome: Adequate for Discharge Goal: Knowledge of patient specific risk factors addressed and post discharge goals established will improve 04/17/2020 1443 by Myriam Forehand, RN Outcome: Completed/Met 04/17/2020 1442 by Myriam Forehand, RN Outcome: Adequate for Discharge 04/17/2020 1442 by Myriam Forehand, RN Outcome: Adequate for Discharge Goal: Individualized Educational Video(s) 04/17/2020 1443 by Myriam Forehand, RN Outcome: Completed/Met 04/17/2020 1442 by Myriam Forehand, RN Outcome: Adequate for Discharge 04/17/2020 1442 by Myriam Forehand, RN Outcome: Adequate for Discharge   Problem: Coping: Goal: Will identify appropriate support needs 04/17/2020 1443 by Myriam Forehand, RN Outcome: Completed/Met 04/17/2020 1442 by Myriam Forehand, RN Outcome: Adequate for Discharge 04/17/2020 1442 by Myriam Forehand, RN Outcome: Adequate for Discharge

## 2020-04-17 NOTE — Discharge Summary (Signed)
Physician Discharge Summary  Dalphine HandingJoan Swanson ZOX:[id number] DOB: 14-Jan-1946 DOA: 04/11/2020  PCP: Pcp, No  Admit date: 04/11/2020 Discharge date: 04/17/2020  Admitted From: Home Disposition: Home  Recommendations for Outpatient Follow-up:  1. Follow up with PCP in 1-2 weeks   Home Health:   Equipment/Devices: Wheelchair.  Discharge Condition: Stable CODE STATUS: Full code Diet recommendation: Heart healthy  Brief/Interim Summary: Vicki PaddockJoan Andrewsis a 74 y.o.femalewith medical history significant ofhypertension, COPD, GERD who presented to med [facility] [place] with complaints ofdiarrhea fever, weakness and fatigue for 4 to 5 days prior to presentation with decreased oral intake. In the ED,patient was mildly hypotensive. Viral screen was positive for influenza A. Urinalysis showed moderate leukocytosis but no nitrites. Initial chemistry was notable for creatinine of 10.7 and BUN of 105. Troponin of 4 lactic acid 1.0. CBC entirely within normal. Head CT without contrast was negative. Chest x-ray showed no active finding. Patient was then admitted to hospital for acute influenza.During hospitalization there was some concern for TIA as well and neurology was consulted.   Acute influenza A infection: Improved.  Suspected TIA.  Head CT scan was negative. MRI of the head/MRA showed severe intracranial atherosclerotic changes. Continue aspirin. MRI of the brain with age-related cerebral atrophy. Neurology on board and recommend dual antiplatelet therapy for 3 months then Plavix alone.Carotid duplex ultrasound showed right ICA 60 to 79% stenosis.  Will need outpatient follow-up/CTA if her renal function continues to improve.  Her husband is requesting for acute rehab. Patient has generalized weakness impairing her to perform daily activities.  A walker would not resolve the issue but a wheelchair will allow patient to perform daily activities.  Acute kidney  injury: Improved.  No prior history of chronic kidney disease. Thought to be secondary to volume depletion from nausea vomiting, diarrhea and poor oral intake with the use of ACE inhibitor's at home. Hemoglobin A1c of 4.9. Encourage oral fluids.   COPD:Continue inhalers. No acute exacerbation at this time.  GERD:Continue PPI  Essential hypertension:Initially hypotensive.  Continue with propanolol.  Hypokalemia.Replenished orally,resume home potassium on discharge.  Metabolic encephalopathy secondary to influenza,acute kidney injury Improved  history of atrial fibrillation:On propranolol at home. Has been resumed.  Defer to cardiology regarding anticoagulation.  Abnormal urinalysis. Urine culture with 10,000 colony of Enterococcus. No evidence of UTI. No indication for antibiotic.       Discharge Diagnoses:  Principal Problem:   Influenza Active Problems:   Acute lower UTI   COPD (chronic obstructive pulmonary disease) (HCC)   GERD (gastroesophageal reflux disease)   Essential hypertension   AKI (acute kidney injury) [facility](HCC)    Discharge Instructions  Discharge Instructions    Ambulatory referral to Neurology   Complete by: As directed    Follow up with stroke clinic NP (Jessica Vanschaick or Darrol Angelarolyn Martin, if both not available, consider Manson AllanSethi, Penumali, or Ahern) at [facility] in about 4 weeks. Thanks.   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   Call MD for:  difficulty breathing, headache or visual disturbances   Complete by: As directed    Call MD for:  extreme fatigue   Complete by: As directed    Diet - low sodium heart healthy   Complete by: As directed    Increase activity slowly   Complete by: As directed      Allergies as of 04/17/2020   No Known Allergies     Medication List  STOP taking these medications   pravastatin 20 MG tablet Commonly known as:  PRAVACHOL     TAKE these medications   ascorbic acid 500 MG tablet Commonly known as: VITAMIN C Take 500 mg by mouth daily.   aspirin 325 MG tablet Take 1 tablet (325 mg total) by mouth daily. Take for 3 months  and then stop. What changed: additional instructions   atorvastatin 40 MG tablet Commonly known as: LIPITOR Take 1 tablet (40 mg total) by mouth at bedtime.   clopidogrel 75 MG tablet Commonly known as: PLAVIX Take 1 tablet (75 mg total) by mouth daily. Start taking on: April 18, 2020   gabapentin 100 MG capsule Commonly known as: NEURONTIN Take 1 capsule (100 mg total) by mouth 3 (three) times daily. What changed:   medication strength  how much to take  when to take this   lisinopril 20 MG tablet Commonly known as: ZESTRIL Take 20 mg by mouth daily.   pantoprazole 40 MG tablet Commonly known as: PROTONIX Take 40 mg by mouth daily before breakfast.   potassium chloride SA 20 MEQ tablet Commonly known as: KLOR-CON Take 20 mEq by mouth daily.   propranolol 40 MG tablet Commonly known as: INDERAL Take 40 mg by mouth daily.   sertraline 100 MG tablet Commonly known as: ZOLOFT Take 100 mg by mouth daily.   topiramate 25 MG tablet Commonly known as: TOPAMAX Take 50 mg by mouth 2 (two) times daily.   Vitamin B Complex Tabs Take 1 tablet by mouth daily.            Durable Medical Equipment  (From admission, onward)         Start     Ordered   04/17/20 1023  For home use only DME lightweight manual wheelchair with seat cushion  Once       Comments: Patient suffers from weakness which impairs their ability to perform daily activities like ambulating in the home.  A walker will not resolve  issue with performing activities of daily living. A wheelchair will allow patient to safely perform daily activities. Patient is not able to propel themselves in the home using a standard weight wheelchair due to weakness. Patient can self propel in the  lightweight wheelchair. Length of need lifetime. Accessories: elevating leg rests (ELRs), wheel locks, extensions and anti-tippers.   04/17/20 1024   04/16/20 1601  For home use only DME 3 n 1  Once        04/16/20 1600   04/16/20 1601  For home use only DME Walker rolling  Once       Question Answer Comment  Walker: With 5 Inch Wheels   Patient needs a walker to treat with the following condition Weakness      04/16/20 1600          Follow-up Information    [facility] Follow up on 04/29/2020.   Why: at 1040 for post hospital loop recorder check  Contact information: [address] [place] [address] [phone]       [facility] Follow up.   Specialty: Home Health Services Why: The home health agency will contact you for the first home visit. Contact information: [address] [place] [address] [phone]        [facility]. Schedule an appointment as soon as possible for a visit in 4 week(s).   Specialty: Neurology Contact information:  [address] [place] [address] [phone]             No Known Allergies  Consultations:  Neurology   Procedures/Studies: DG Abd 1 View  Result Date: 04/13/2020 CLINICAL DATA:  Metal implant.  MRI. EXAM: ABDOMEN - 1 VIEW COMPARISON:  None. FINDINGS: There are metallic clips in the right upper quadrant likely related to prior cholecystectomy. These are not a contraindication to MRI. 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 The bowel gas pattern is nonobstructive. IMPRESSION: Metallic clips in the right upper quadrant likely related to prior cholecystectomy. These are not a contraindication to MRI. Nonobstructive bowel gas pattern. Electronically Signed   By: Katherine Mantle M.D.    On: 04/13/2020 00:03   CT HEAD WO CONTRAST  Result Date: 04/11/2020 CLINICAL DATA:  Confusion for a week. Diarrhea last weekend. Weakness. EXAM: CT HEAD WITHOUT CONTRAST TECHNIQUE: Contiguous axial images were obtained from the base of the skull through the vertex without intravenous contrast. COMPARISON:  Head CT, 06/07/2016 FINDINGS: Brain: No evidence of acute infarction, hemorrhage, hydrocephalus, extra-axial collection or mass lesion/mass effect. Small area of lucency in the right basal ganglia consistent with a old lacunar infarct. Minor areas of white matter hypoattenuation are noted bilaterally consistent with chronic microvascular ischemic change. Vascular: No hyperdense vessel or unexpected calcification. Skull: Normal. Negative for fracture or focal lesion. Sinuses/Orbits: Globes and orbits are unremarkable. Visualized sinuses are clear. Other: None. IMPRESSION: 1. No acute intracranial abnormalities. Electronically Signed   By: Amie Portland M.D.   On: 04/11/2020 13:49   MR ANGIO HEAD WO CONTRAST  Result Date: 04/13/2020 CLINICAL DATA:  74 year old female code stroke presentation yesterday. No acute finding on MRI. EXAM: MRA HEAD WITHOUT CONTRAST TECHNIQUE: Angiographic images of the Circle of Willis were obtained using MRA technique without intravenous contrast. COMPARISON:  Brain MRI 0004 hours today and earlier. FINDINGS: No intracranial mass effect or ventriculomegaly. Antegrade flow in the posterior circulation. Dominant appearing distal left vertebral artery and moderate to severe stenosis of the right vertebral V4 segment (series 1055, image 9). The right AICA appears to be dominant and is patent. Mild irregularity and stenosis of the left V4 segment just upstream of the normal left PICA origin. Patent vertebrobasilar junction. Mild proximal basilar artery stenosis. More moderate irregularity and stenosis of the basilar tip, but the SCA and PCA origins remain patent. Superimposed  moderate to severe left and severe short segment right PCA P1 stenoses. Bilateral PCA branches remain patent although are mildly irregular, more so the left. Antegrade flow in both ICA siphons with artifacts suspected on the lowest image. Both siphons are patent although irregular and there is evidence of severe left siphon stenosis in the cavernous segment (flow gap which also corresponds to an area of flow void irregularity on the earlier MRI). On the right side sign some only mild siphon stenosis is suspected. Both carotid termini remain patent. MCA and ACA origins are within normal limits. Visible ACA branches are within normal limits. MCA M1 segments and MCA bi/trifurcations are within normal limits. Visible bilateral MCA branches are mildly irregular. IMPRESSION: 1. Evidence of severe intracranial atherosclerosis, most notably: - Severe stenosis Left ICA cavernous segment. - up to Moderate stenosis at the Basilar tip, with moderate to Severe bilateral PCA P1 stenoses. - Moderate to severe Right Vertebral V4 segment stenosis. 2. Additional mild stenoses including distal Left vertebral artery, proximal Basilar  artery, Right siphon. 3. Neurointerventional Radiology consultation would be valuable. Electronically Signed   By: Odessa Fleming M.D.   On: 04/13/2020 13:48   MR BRAIN WO CONTRAST  Result Date: 04/13/2020 CLINICAL DATA:  Initial evaluation for neuro deficit, stroke suspected. EXAM: MRI HEAD WITHOUT CONTRAST TECHNIQUE: Multiplanar, multiecho pulse sequences of the brain and surrounding structures were obtained without intravenous contrast. COMPARISON:  Prior CT from 04/12/2020. FINDINGS: Brain: Diffuse prominence of the CSF containing spaces compatible with generalized age-related cerebral atrophy. Mild scattered patchy T2/FLAIR hyperintensity within the periventricular and deep white matter both cerebral hemispheres as well as the pons, most likely related chronic microvascular ischemic disease, mild for  age. 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 No abnormal foci of restricted diffusion to suggest acute or subacute ischemia. Gray-white matter differentiation maintained. No encephalomalacia to suggest chronic cortical infarction. No foci of susceptibility artifact to suggest acute or chronic intracranial hemorrhage. No mass lesion, midline shift or mass effect. No hydrocephalus or extra-axial fluid collection. Pituitary gland suprasellar region normal. Midline structures intact. Vascular: Major intracranial vascular flow voids are maintained. Skull and upper cervical spine: Craniocervical junction within normal limits. Bone marrow signal intensity normal. No scalp soft tissue abnormality. Sinuses/Orbits: Patient status post bilateral ocular lens replacement. Globes and orbital soft tissues demonstrate no acute finding. Mild scattered mucosal thickening noted within the ethmoidal air cells, sphenoid sinuses, and maxillary sinuses. Air-fluid level within the left sphenoid sinus suggestive of acute sinusitis. No significant mastoid effusion. Inner ear structures normal. Other: None. IMPRESSION: 1. No acute intracranial abnormality. 2. Age-related cerebral atrophy with mild chronic small vessel ischemic disease. 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 3. Air-fluid level within the left sphenoid sinus suggestive of acute sinusitis. Electronically Signed   By: Rise Mu M.D.   On: 04/13/2020 00:30   US RENAL  Result Date: 04/12/2020 CLINICAL DATA:  Acute renal failure EXAM: RENAL / URINARY TRACT ULTRASOUND COMPLETE COMPARISON:  None. FINDINGS: Right Kidney: Renal measurements: 9.1 x 4.6 x 4.7 cm = volume: 102 mL. Echogenicity appears within normal limits. There is mild right hydronephrosis. There is a 0.3 cm echogenic focus which is nonspecific but possibly represents a small stone. No renal mass. Left Kidney: Renal measurements: 8.8 x  4.7 x 4.2 cm = volume: 90 mL. Echogenicity appears within normal limits. No hydronephrosis. No renal calculus. No renal mass identified. Bladder: Decompressed with Foley catheter in place. Other: None. IMPRESSION: 1. Mild right kidney hydronephrosis. There is a 0.3 cm echogenic focus in the right kidney which could represent a small stone. 2.  Unremarkable appearance of the left kidney. Electronically Signed   By: Emmaline Kluver M.D.   On: 04/12/2020 11:04   EP PPM/ICD IMPLANT  Result Date: 04/16/2020 SURGEON:  Loman Brooklyn, MD   PREPROCEDURE DIAGNOSIS:  Cryptogenic Stroke   POSTPROCEDURE DIAGNOSIS:  Cryptogenic Stroke    PROCEDURES:  1. Implantable loop recorder implantation   INTRODUCTION:  Leilanie Rauda is a 74 y.o. female with a history of unexplained stroke who presents today for implantable loop implantation.  The patient has had a cryptogenic stroke.  Despite an extensive workup by neurology, no reversible causes have been identified.  she has worn telemetry during which she did not have arrhythmias.  There is significant concern for possible atrial fibrillation as the cause for the patients stroke.  The patient therefore presents today for implantable loop implantation.   DESCRIPTION OF PROCEDURE:  Informed written consent was  obtained, and the patient was brought to the electrophysiology lab in a fasting state.  The patient required no sedation for the procedure today.  Mapping over the patient's chest was performed by the EP lab staff to identify the area where electrograms were most prominent for ILR recording.  This area was found to be the left parasternal region over the 3rd-4th intercostal space. The patients left chest was therefore prepped and draped in the usual sterile fashion by the EP lab staff. The skin overlying the left parasternal region was infiltrated with lidocaine for local analgesia.  A 0.5-cm incision was made over the left parasternal region over the 3rd intercostal space.  A  subcutaneous ILR pocket was fashioned using a combination of sharp and blunt dissection.  A Medtronic Reveal Linq model [place] [id number] implantable loop recorder was then placed into the pocket  R waves were very prominent and measured 0.50mV. EBL<1 ml.  Steri- Strips and a sterile dressing were then applied.  There were no early apparent complications.   CONCLUSIONS:  1. Successful implantation of a Medtronic Reveal LINQ implantable loop recorder for cryptogenic stroke  2. No early apparent complications.   DG Chest Port 1 View  Result Date: 04/11/2020 CLINICAL DATA:  Questionable sepsis EXAM: PORTABLE CHEST 1 VIEW COMPARISON:  None. FINDINGS: The lungs are clear and negative for focal airspace consolidation, pulmonary edema or suspicious pulmonary nodule. No pleural effusion or pneumothorax. Cardiac and mediastinal contours are within normal limits. No acute fracture or lytic or blastic osseous lesions. The visualized upper abdominal bowel gas pattern is unremarkable. IMPRESSION: No active disease. Electronically Signed   By: Malachy Moan M.D.   On: 04/11/2020 13:48   ECHOCARDIOGRAM COMPLETE  Result Date: 04/14/2020    ECHOCARDIOGRAM REPORT   Patient Name:   TAYLOUR LIETZKE Date of Exam: 04/14/2020 Medical Rec #:  [id number]    Height:       60.0 in Accession #:    [id number]   Weight:       153.7 lb Date of Birth:  05/29/1945    BSA:          1.669 m Patient Age:    74 years     BP:           169/64 mmHg Patient Gender: F            HR:           48 bpm. Exam Location:  Inpatient Procedure: 2D Echo, Color Doppler and Cardiac Doppler Indications:    Stroke i163.9  History:        Patient has prior history of Echocardiogram examinations, most                 recent 09/19/2019. COPD, Arrythmias:Atrial Fibrillation; Risk                 Factors:Hypertension. Prior performed at [facility].  Sonographer:    Irving Burton Senior RDCS Referring Phys: [phone] DESIREE METZGER-CIHELKA  Sonographer Comments: Suboptimal  parasternal window due to COPD IMPRESSIONS  1. Left ventricular ejection fraction, by estimation, is 65 to 70%. The left ventricle has normal function. The left ventricle has no regional wall motion abnormalities. There is mild left ventricular hypertrophy. Left ventricular diastolic parameters are consistent with Grade II diastolic dysfunction (pseudonormalization).  2. Right ventricular systolic function is normal. The right ventricular size is normal. Tricuspid regurgitation signal is inadequate for assessing PA pressure.  3. Left atrial size was mildly dilated.  4.  Right atrial size was mildly dilated.  5. The mitral valve is normal in structure. Trivial mitral valve regurgitation. No evidence of mitral stenosis.  6. The aortic valve is tricuspid. Aortic valve regurgitation is not visualized. Mild aortic valve sclerosis is present, with no evidence of aortic valve stenosis.  7. The inferior vena cava is normal in size with greater than 50% respiratory variability, suggesting right atrial pressure of 3 mmHg. FINDINGS  Left Ventricle: Left ventricular ejection fraction, by estimation, is 65 to 70%. The left ventricle has normal function. The left ventricle has no regional wall motion abnormalities. The left ventricular internal cavity size was normal in size. There is  mild left ventricular hypertrophy. Left ventricular diastolic parameters are consistent with Grade II diastolic dysfunction (pseudonormalization). Right Ventricle: The right ventricular size is normal. No increase in right ventricular wall thickness. Right ventricular systolic function is normal. Tricuspid regurgitation signal is inadequate for assessing PA pressure. Left Atrium: Left atrial size was mildly dilated. Right Atrium: Right atrial size was mildly dilated. Pericardium: Trivial pericardial effusion is present. Mitral Valve: The mitral valve is normal in structure. There is mild thickening of the mitral valve leaflet(s). Trivial mitral  valve regurgitation. No evidence of mitral valve stenosis. Tricuspid Valve: The tricuspid valve is normal in structure. Tricuspid valve regurgitation is not demonstrated. Aortic Valve: The aortic valve is tricuspid. Aortic valve regurgitation is not visualized. Mild aortic valve sclerosis is present, with no evidence of aortic valve stenosis. Pulmonic Valve: The pulmonic valve was normal in structure. Pulmonic valve regurgitation is not visualized. Aorta: The aortic root is normal in size and structure. Venous: The inferior vena cava is normal in size with greater than 50% respiratory variability, suggesting right atrial pressure of 3 mmHg. IAS/Shunts: No atrial level shunt detected by color flow Doppler.  LEFT VENTRICLE PLAX 2D LVIDd:         3.50 cm  Diastology LVIDs:         2.20 cm  LV e' medial:    6.42 cm/s LV PW:         1.20 cm  LV E/e' medial:  17.8 LV IVS:        1.10 cm  LV e' lateral:   6.64 cm/s LVOT diam:     1.80 cm  LV E/e' lateral: 17.2 LV SV:         75 LV SV Index:   45 LVOT Area:     2.54 cm  RIGHT VENTRICLE RV S prime:     14.10 cm/s TAPSE (M-mode): 2.3 cm LEFT ATRIUM             Index       RIGHT ATRIUM           Index LA diam:        2.90 cm 1.74 cm/m  RA Area:     11.00 cm LA Vol (A2C):   68.2 ml 40.87 ml/m RA Volume:   22.20 ml  13.30 ml/m LA Vol (A4C):   41.9 ml 25.11 ml/m LA Biplane Vol: 56.2 ml 33.68 ml/m  AORTIC VALVE LVOT Vmax:   127.00 cm/s LVOT Vmean:  84.700 cm/s LVOT VTI:    0.295 m  AORTA Ao Root diam: 2.50 cm MITRAL VALVE MV Area (PHT): 2.42 cm     SHUNTS MV Decel Time: 313 msec     Systemic VTI:  0.30 m MV E velocity: 114.00 cm/s  Systemic Diam: 1.80 cm MV A velocity: 77.80 cm/s MV E/A  ratio:  1.47 Marca Ancona MD Electronically signed by Marca Ancona MD Signature Date/Time: 04/14/2020/2:20:17 PM    Final    CT HEAD CODE STROKE WO CONTRAST  Result Date: 04/12/2020 CLINICAL DATA:  Code stroke. Initial evaluation for acute facial droop, aphasia. Of EXAM: CT HEAD  WITHOUT CONTRAST TECHNIQUE: Contiguous axial images were obtained from the base of the skull through the vertex without intravenous contrast. COMPARISON:  None. FINDINGS: Brain: Cerebral volume within normal limits for age. Mild chronic microvascular ischemic disease noted within the periventricular white matter. 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 No acute intracranial hemorrhage. No acute large vessel territory infarct. No mass lesion, midline shift or mass effect. No hydrocephalus or extra-axial fluid collection. Vascular: No hyperdense vessel. Mild calcified atherosclerosis at the skull base. Skull: Scalp soft tissues and calvarium within normal limits. Sinuses/Orbits: Globes and orbital soft tissues are normal. Mild scattered mucosal thickening noted within the paranasal sinuses, with small volume opacity within the left sphenoid sinus. Mastoid air cells and middle ear cavities are clear. Other: None. ASPECTS [facility] Stroke Program Early CT Score) - Ganglionic level infarction (caudate, lentiform nuclei, internal capsule, insula, M1-M3 cortex): 7 - Supraganglionic infarction (M4-M6 cortex): 3 Total score (0-10 with 10 being normal): 10 IMPRESSION: 1. No acute intracranial abnormality. 2. ASPECTS is 10. 3. 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 These results were communicated to Dr. Thomasena Edis at 10:30 pmon 12/11/2021by text page via the [facility] messaging system. Electronically Signed   By: Rise Mu M.D.   On: 04/12/2020 22:32   VAS US CAROTID  Result Date: 04/14/2020 Carotid Arterial Duplex Study Indications:       TIA. Risk Factors:      Hypertension. Comparison Study:  No prior studies. Performing Technologist: Jean Rosenthal RDMS  Examination Guidelines: A complete evaluation includes B-mode imaging, spectral Doppler, color Doppler, and power Doppler as needed of all accessible portions of each  vessel. Bilateral testing is considered an integral part of a complete examination. Limited examinations for reoccurring indications may be performed as noted.  Right Carotid Findings: +----------+--------+--------+--------+-----------------------+--------+           PSV cm/sEDV cm/sStenosisPlaque Description     Comments +----------+--------+--------+--------+-----------------------+--------+ CCA Prox  123     22                                              +----------+--------+--------+--------+-----------------------+--------+ CCA Distal97      24                                              +----------+--------+--------+--------+-----------------------+--------+ ICA Prox  265     99      60-79%  heterogenous and smooth         +----------+--------+--------+--------+-----------------------+--------+ ICA Mid   287     101     60-79%                                  +----------+--------+--------+--------+-----------------------+--------+ ICA Distal206     59                                              +----------+--------+--------+--------+-----------------------+--------+  ECA       134                                                     +----------+--------+--------+--------+-----------------------+--------+ +----------+--------+-------+----------------+-------------------+           PSV cm/sEDV cmsDescribe        Arm Pressure (mmHG) +----------+--------+-------+----------------+-------------------+ [id number]            Multiphasic, WNL                    +----------+--------+-------+----------------+-------------------+ +---------+--------+--+--------+--+---------+ VertebralPSV cm/s91EDV cm/s14Antegrade +---------+--------+--+--------+--+---------+  Left Carotid Findings: +----------+--------+--------+--------+------------------+--------+           PSV cm/sEDV cm/sStenosisPlaque DescriptionComments  +----------+--------+--------+--------+------------------+--------+ CCA Prox  132     30                                         +----------+--------+--------+--------+------------------+--------+ CCA Distal118     34                                         +----------+--------+--------+--------+------------------+--------+ ICA Prox  92      24      1-39%                              +----------+--------+--------+--------+------------------+--------+ ICA Distal194     57                                tortuous +----------+--------+--------+--------+------------------+--------+ ECA       122                                                +----------+--------+--------+--------+------------------+--------+ +----------+--------+--------+----------------+-------------------+           PSV cm/sEDV cm/sDescribe        Arm Pressure (mmHG) +----------+--------+--------+----------------+-------------------+ [id number]             Multiphasic, WNL                    +----------+--------+--------+----------------+-------------------+ +---------+--------+---+--------+--+---------+ VertebralPSV cm/s108EDV cm/s30Antegrade +---------+--------+---+--------+--+---------+   Summary: Right Carotid: 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 Left Carotid: Velocities in the left ICA are consistent with a 1-39% stenosis. Vertebrals:  Bilateral vertebral arteries demonstrate antegrade flow. Subclavians: Normal flow hemodynamics were seen in bilateral subclavian              arteries. *See table(s) above for measurements and observations.  Electronically signed by Fabienne Bruns MD on 04/14/2020 at 7:40:14 PM.    Final        Subjective:   Discharge Exam: Vitals:   04/17/20 0843 04/17/20 1221  BP: (!) 188/70 (!) 187/78  Pulse: 61 (!) 57  Resp: 16 16  Temp: 97.9 F (36.6 C) 98.4 F (36.9 C)  SpO2: 97% 99%   Vitals:  04/17/20  0006 04/17/20 0401 04/17/20 0843 04/17/20 1221  BP: (!) 162/65 (!) 176/71 (!) 188/70 (!) 187/78  Pulse: (!) 59 (!) 57 61 (!) 57  Resp: 18 18 16 16   Temp: 98.8 F (37.1 C) 97.6 F (36.4 C) 97.9 F (36.6 C) 98.4 F (36.9 C)  TempSrc: Oral  Oral Oral  SpO2: 98% 97% 97% 99%  Weight:      Height:        General: Pt is alert, awake, not in acute distress Cardiovascular: RRR, S1/S2 +, no rubs, no gallops Respiratory: CTA bilaterally, no wheezing, no rhonchi Abdominal: Soft, NT, ND, bowel sounds + Extremities: no edema, no cyanosis    The results of significant diagnostics from this hospitalization (including imaging, microbiology, ancillary and laboratory) are listed below for reference.     Microbiology: Recent Results (from the past 240 hour(s))  Blood culture (routine x 2)     Status: None   Collection Time: 04/11/20 12:50 PM   Specimen: BLOOD  Result Value Ref Range Status   Specimen Description   Final    BLOOD RIGHT ANTECUBITAL Performed at [facility], [address] [place], [place]    Special Requests   Final    BOTTLES DRAWN AEROBIC AND ANAEROBIC Blood Culture adequate volume Performed at [facility], [address] [place], [place]    Culture   Final    NO GROWTH 5 DAYS Performed at [facility], [address], [place], [place]    Report Status 04/16/2020 FINAL  Final  Blood culture (routine x 2)     Status: None   Collection Time: 04/11/20  1:11 PM   Specimen: BLOOD  Result Value Ref Range Status   Specimen Description   Final    BLOOD LEFT ANTECUBITAL Performed at [facility], [address] [place], [place]    Special Requests   Final    BOTTLES DRAWN AEROBIC AND ANAEROBIC Blood Culture adequate volume Performed at [facility], [address] [place], [place]    Culture   Final    NO GROWTH 5 DAYS Performed at [facility], [address] [place], [place]    Report Status 04/16/2020 FINAL  Final  Resp Panel by RT-PCR (Flu A&B, Covid) Nasopharyngeal Swab     Status: Abnormal   Collection Time: 04/11/20  1:12 PM   Specimen: Nasopharyngeal Swab; Nasopharyngeal(NP) swabs in vial transport medium  Result Value Ref Range Status   SARS Coronavirus 2 by RT PCR NEGATIVE NEGATIVE Final    Comment: (NOTE) SARS-CoV-2 target nucleic acids are NOT DETECTED.  The SARS-CoV-2 RNA is generally detectable in upper respiratory specimens during the acute phase of infection. The lowest concentration of SARS-CoV-2 viral copies this assay can detect is 138 copies/mL. A negative result does not preclude SARS-Cov-2 infection and should not be used as the sole basis for treatment or other patient management decisions. A negative result may occur with  improper specimen collection/handling, submission of specimen other than nasopharyngeal swab, presence of viral mutation(s) within the areas targeted by this assay, and inadequate number of viral copies(<138 copies/mL). A negative result must be combined with clinical observations, patient history, and epidemiological information. The expected result is Negative.  Fact Sheet for Patients:  14/10/21  Fact Sheet for Healthcare Providers:  BloggerCourse.com  This test is no t yet approved or  cleared by the [place] and  has been authorized for detection and/or diagnosis of SARS-CoV-2 by FDA under an Emergency Use Authorization (EUA). This EUA will remain  in effect (meaning this test can be used) for the duration of the COVID-19 declaration under Section 564(b)(1) of the Act, 21 U.S.C.section 360bbb-3(b)(1), unless the authorization is terminated  or revoked sooner.       Influenza A by PCR POSITIVE (A) NEGATIVE Final   Influenza B by PCR NEGATIVE NEGATIVE Final    Comment: (NOTE) The Xpert Xpress  SARS-CoV-2/FLU/RSV plus assay is intended as an aid in the diagnosis of influenza from Nasopharyngeal swab specimens and should not be used as a sole basis for treatment. Nasal washings and aspirates are unacceptable for Xpert Xpress SARS-CoV-2/FLU/RSV testing.  Fact Sheet for Patients: BloggerCourse.com  Fact Sheet for Healthcare Providers: SeriousBroker.it  This test is not yet approved or cleared by the [place] FDA and has been authorized for detection and/or diagnosis of SARS-CoV-2 by FDA under an Emergency Use Authorization (EUA). This EUA will remain in effect (meaning this test can be used) for the duration of the COVID-19 declaration under Section 564(b)(1) of the Act, 21 U.S.C. section 360bbb-3(b)(1), unless the authorization is terminated or revoked.  Performed at [facility], [address] [place], [place] [address]   Urine culture     Status: Abnormal   Collection Time: 04/11/20  5:00 PM   Specimen: In/Out Cath Urine  Result Value Ref Range Status   Specimen Description   Final    IN/OUT CATH URINE Performed at [facility], [address] [place], [place] [address]    Special Requests   Final    NONE Performed at [facility], [address] [place], [place] [address]    Culture 10,000 COLONIES/mL ENTEROCOCCUS FAECALIS (A)  Final   Report Status 04/14/2020 FINAL  Final   Organism ID, Bacteria ENTEROCOCCUS FAECALIS (A)  Final      Susceptibility   Enterococcus faecalis - MIC*    AMPICILLIN <=2 SENSITIVE Sensitive     NITROFURANTOIN <=16 SENSITIVE Sensitive     VANCOMYCIN 1 SENSITIVE Sensitive     * 10,000 COLONIES/mL ENTEROCOCCUS FAECALIS  Culture, Urine     Status: None   Collection Time: 04/12/20 10:21 AM   Specimen: Urine, Random  Result Value Ref Range Status   Specimen Description URINE, RANDOM  Final   Special Requests NONE  Final   Culture   Final    NO  GROWTH Performed at [facility], [address] [place], [place] [address]    Report Status 04/13/2020 FINAL  Final     Labs: BNP (last 3 results) No results for input(s): BNP in the last 8760 hours. Basic Metabolic Panel: Recent Labs  Lab 04/13/20 0411 04/14/20 0551 04/15/20 0414 04/16/20 0346 04/17/20 0127  NA 144 146* 146* 144 144  K 3.7 3.0* 3.3* 3.8 3.7  CL 120* 108 110 113* 113*  CO2 13* 25 27 22  20*  GLUCOSE 121* 116* 90 87 88  BUN 75* 51* 40* 28* 23  CREATININE 4.32* 2.06* 1.68* 1.48* 1.35*  CALCIUM 8.1* 7.7* 7.4* 7.8* 7.9*  MG  --   --  1.2*  --   --   PHOS  --   --  2.7  --  2.5   Liver Function Tests: Recent Labs  Lab 04/11/20 1250 04/12/20 0456 04/15/20 0414 04/17/20 0127  AST 11* 11*  --   --   ALT 11 9  --   --   ALKPHOS 98 68  --   --   BILITOT 0.7 0.4  --   --   PROT 7.9 5.6*  --   --   ALBUMIN 4.3 3.0* 2.5* 2.5*   No results for input(s): LIPASE, AMYLASE in the last 168 hours. Recent Labs  Lab 04/12/20 2307  AMMONIA 23   CBC: Recent Labs  Lab 04/11/20 1250 04/12/20 0456 04/15/20 0414 04/16/20 0346 04/17/20 0127  WBC 9.4 6.2 7.5 8.2 10.3  NEUTROABS 5.5  --   --   --   --   HGB 15.9* 12.4 11.1* 10.7* 11.0*  HCT 45.3 34.4* 30.6* 31.1* 33.1*  MCV 88.5 87.5 89.0 90.4 91.7  PLT 231 118* 125* 146* 167   Cardiac Enzymes: No results for input(s): CKTOTAL, CKMB, CKMBINDEX, TROPONINI in the last 168 hours. BNP: Invalid input(s): POCBNP CBG: Recent Labs  Lab 04/11/20 1249 04/12/20 2143  GLUCAP 110* 126*   D-Dimer No results for input(s): DDIMER in the last 72 hours. Hgb A1c No results for input(s): HGBA1C in the last 72 hours. Lipid Profile No results for input(s): CHOL, HDL, LDLCALC, TRIG, CHOLHDL, LDLDIRECT in the last 72 hours. Thyroid function studies No results for input(s): TSH, T4TOTAL, T3FREE, THYROIDAB in the last 72 hours.  Invalid input(s): FREET3 Anemia work up No results for input(s): VITAMINB12, FOLATE,  FERRITIN, TIBC, IRON, RETICCTPCT in the last 72 hours. Urinalysis    Component Value Date/Time   COLORURINE YELLOW 04/11/2020 1700   APPEARANCEUR CLOUDY (A) 04/11/2020 1700   LABSPEC >1.030 (H) 04/11/2020 1700   PHURINE 5.0 04/11/2020 1700   GLUCOSEU NEGATIVE 04/11/2020 1700   HGBUR SMALL (A) 04/11/2020 1700   BILIRUBINUR NEGATIVE 04/11/2020 1700   KETONESUR NEGATIVE 04/11/2020 1700   PROTEINUR 30 (A) 04/11/2020 1700   NITRITE NEGATIVE 04/11/2020 1700   LEUKOCYTESUR MODERATE (A) 04/11/2020 1700   Sepsis Labs Invalid input(s): PROCALCITONIN,  WBC,  LACTICIDVEN Microbiology Recent Results (from the past 240 hour(s))  Blood culture (routine x 2)     Status: None   Collection Time: 04/11/20 12:50 PM   Specimen: BLOOD  Result Value Ref Range Status   Specimen Description   Final    BLOOD RIGHT ANTECUBITAL Performed at [facility], [address] [place], [place] [address]    Special Requests   Final    BOTTLES DRAWN AEROBIC AND ANAEROBIC Blood Culture adequate volume Performed at [facility], [address] [place], [place] [address]    Culture   Final    NO GROWTH 5 DAYS Performed at [facility], [address], [place], [place] [address]    Report Status 04/16/2020 FINAL  Final  Blood culture (routine x 2)     Status: None   Collection Time: 04/11/20  1:11 PM   Specimen: BLOOD  Result Value Ref Range Status   Specimen Description   Final    BLOOD LEFT ANTECUBITAL Performed at [facility], [address] [place], [place] [address]    Special Requests   Final    BOTTLES DRAWN AEROBIC AND ANAEROBIC Blood Culture adequate volume Performed at [facility], [address] [place], [place] [address]    Culture   Final    NO GROWTH 5 DAYS Performed at [facility], [address], [place], [place] [address]  Report Status 04/16/2020 FINAL  Final  Resp Panel by RT-PCR (Flu A&B, Covid) Nasopharyngeal Swab      Status: Abnormal   Collection Time: 04/11/20  1:12 PM   Specimen: Nasopharyngeal Swab; Nasopharyngeal(NP) swabs in vial transport medium  Result Value Ref Range Status   SARS Coronavirus 2 by RT PCR NEGATIVE NEGATIVE Final    Comment: (NOTE) SARS-CoV-2 target nucleic acids are NOT DETECTED.  The SARS-CoV-2 RNA is generally detectable in upper respiratory specimens during the acute phase of infection. The lowest concentration of SARS-CoV-2 viral copies this assay can detect is 138 copies/mL. A negative result does not preclude SARS-Cov-2 infection and should not be used as the sole basis for treatment or other patient management decisions. A negative result may occur with  improper specimen collection/handling, submission of specimen other than nasopharyngeal swab, presence of viral mutation(s) within the areas targeted by this assay, and inadequate number of viral copies(<138 copies/mL). A negative result must be combined with clinical observations, patient history, and epidemiological information. The expected result is Negative.  Fact Sheet for Patients:  BloggerCourse.com  Fact Sheet for Healthcare Providers:  SeriousBroker.it  This test is no t yet approved or cleared by the [place] FDA and  has been authorized for detection and/or diagnosis of SARS-CoV-2 by FDA under an Emergency Use Authorization (EUA). This EUA will remain  in effect (meaning this test can be used) for the duration of the COVID-19 declaration under Section 564(b)(1) of the Act, 21 U.S.C.section 360bbb-3(b)(1), unless the authorization is terminated  or revoked sooner.       Influenza A by PCR POSITIVE (A) NEGATIVE Final   Influenza B by PCR NEGATIVE NEGATIVE Final    Comment: (NOTE) The Xpert Xpress SARS-CoV-2/FLU/RSV plus assay is intended as an aid in the diagnosis of influenza from Nasopharyngeal swab specimens and should not be used as a sole  basis for treatment. Nasal washings and aspirates are unacceptable for Xpert Xpress SARS-CoV-2/FLU/RSV testing.  Fact Sheet for Patients: BloggerCourse.com  Fact Sheet for Healthcare Providers: SeriousBroker.it  This test is not yet approved or cleared by the [place] FDA and has been authorized for detection and/or diagnosis of SARS-CoV-2 by FDA under an Emergency Use Authorization (EUA). This EUA will remain in effect (meaning this test can be used) for the duration of the COVID-19 declaration under Section 564(b)(1) of the Act, 21 U.S.C. section 360bbb-3(b)(1), unless the authorization is terminated or revoked.  Performed at [facility], [address] [place], [place] [address]   Urine culture     Status: Abnormal   Collection Time: 04/11/20  5:00 PM   Specimen: In/Out Cath Urine  Result Value Ref Range Status   Specimen Description   Final    IN/OUT CATH URINE Performed at [facility], [address] [place], [place] [address]    Special Requests   Final    NONE Performed at [facility], [address] [place], [place] [address]    Culture 10,000 COLONIES/mL ENTEROCOCCUS FAECALIS (A)  Final   Report Status 04/14/2020 FINAL  Final   Organism ID, Bacteria ENTEROCOCCUS FAECALIS (A)  Final      Susceptibility   Enterococcus faecalis - MIC*    AMPICILLIN <=2 SENSITIVE Sensitive     NITROFURANTOIN <=16 SENSITIVE Sensitive     VANCOMYCIN 1 SENSITIVE Sensitive     * 10,000 COLONIES/mL ENTEROCOCCUS FAECALIS  Culture, Urine     Status: None  Collection Time: 04/12/20 10:21 AM   Specimen: Urine, Random  Result Value Ref Range Status   Specimen Description URINE, RANDOM  Final   Special Requests NONE  Final   Culture   Final    NO GROWTH Performed at [facility], [address] [place], [place] [address]    Report Status 04/13/2020 FINAL  Final     Time coordinating  discharge: 40 minutes  SIGNED:   Arsenio Loader, MD  [facility] 04/17/2020, 12:58 PM

## 2020-04-17 NOTE — TOC Transition Note (Signed)
Transition of Care [facility]) - CM/SW Discharge Note   Patient Details  Name: Vicki Swanson MRN: [id number] Date of Birth: 1946/03/02  [facility] CM/SW Contact:  Kermit Balo, RN Phone Number: 04/17/2020, 2:18 PM   Clinical Narrative:    Pt is discharging home with [facility] services through [place]. Cory with [facility] aware of d/c.  Pt's spouse has taken 3 in 1 and walker home. Pt only wanted the orders for the wheelchair and they will decide about getting it later.  Pt has supervision at home and transportation to home.    Final next level of care: Home w Home Health Services Barriers to Discharge: No Barriers Identified   Patient Goals and CMS Choice Patient states their goals for this hospitalization and ongoing recovery are:: Pt spouse agreeable to SNF placement. CMS Medicare.gov Compare Post Acute Care list provided to:: Patient Represenative (must comment) Choice offered to / list presented to : Spouse  Discharge Placement                       Discharge Plan and Services                                     Social Determinants of Health (SDOH) Interventions     Readmission Risk Interventions No flowsheet data found.

## 2020-04-17 NOTE — Progress Notes (Cosign Needed)
Wheelchair:  Patient suffers from weakness which impairs their ability to perform daily activities like ambulating in the home. A walker will not resolve  issue with performing activities of daily living. A wheelchair will allow patient to safely perform daily activities. Patient is not able to propel themselves in the home using a standard weight wheelchair due to weakness. Patient can self propel in the lightweight wheelchair. Length of need lifetime.  Accessories: elevating leg rests (ELRs), wheel locks, extensions and anti-tippers. 

## 2020-04-17 NOTE — Plan of Care (Signed)
Problem: Education: Goal: Knowledge of General Education information will improve Description: Including pain rating scale, medication(s)/side effects and non-pharmacologic comfort measures 04/17/2020 1442 by Becky Augusta, RN Outcome: Adequate for Discharge 04/17/2020 1442 by Becky Augusta, RN Outcome: Adequate for Discharge   Problem: Health Behavior/Discharge Planning: Goal: Ability to manage health-related needs will improve 04/17/2020 1442 by Becky Augusta, RN Outcome: Adequate for Discharge 04/17/2020 1442 by Becky Augusta, RN Outcome: Adequate for Discharge   Problem: Clinical Measurements: Goal: Ability to maintain clinical measurements within normal limits will improve 04/17/2020 1442 by Becky Augusta, RN Outcome: Adequate for Discharge 04/17/2020 1442 by Becky Augusta, RN Outcome: Adequate for Discharge Goal: Will remain free from infection 04/17/2020 1442 by Becky Augusta, RN Outcome: Adequate for Discharge 04/17/2020 1442 by Becky Augusta, RN Outcome: Adequate for Discharge Goal: Diagnostic test results will improve 04/17/2020 1442 by Becky Augusta, RN Outcome: Adequate for Discharge 04/17/2020 1442 by Becky Augusta, RN Outcome: Adequate for Discharge Goal: Respiratory complications will improve 04/17/2020 1442 by Becky Augusta, RN Outcome: Adequate for Discharge 04/17/2020 1442 by Becky Augusta, RN Outcome: Adequate for Discharge Goal: Cardiovascular complication will be avoided 04/17/2020 1442 by Becky Augusta, RN Outcome: Adequate for Discharge 04/17/2020 1442 by Becky Augusta, RN Outcome: Adequate for Discharge   Problem: Activity: Goal: Risk for activity intolerance will decrease 04/17/2020 1442 by Becky Augusta, RN Outcome: Adequate for Discharge 04/17/2020 1442 by Becky Augusta, RN Outcome: Adequate for Discharge   Problem: Nutrition: Goal: Adequate nutrition will be maintained 04/17/2020 1442 by Becky Augusta, RN Outcome: Adequate for  Discharge 04/17/2020 1442 by Becky Augusta, RN Outcome: Adequate for Discharge   Problem: Coping: Goal: Level of anxiety will decrease 04/17/2020 1442 by Becky Augusta, RN Outcome: Adequate for Discharge 04/17/2020 1442 by Becky Augusta, RN Outcome: Adequate for Discharge   Problem: Elimination: Goal: Will not experience complications related to bowel motility 04/17/2020 1442 by Becky Augusta, RN Outcome: Adequate for Discharge 04/17/2020 1442 by Becky Augusta, RN Outcome: Adequate for Discharge Goal: Will not experience complications related to urinary retention 04/17/2020 1442 by Becky Augusta, RN Outcome: Adequate for Discharge 04/17/2020 1442 by Becky Augusta, RN Outcome: Adequate for Discharge   Problem: Pain Managment: Goal: General experience of comfort will improve 04/17/2020 1442 by Becky Augusta, RN Outcome: Adequate for Discharge 04/17/2020 1442 by Becky Augusta, RN Outcome: Adequate for Discharge   Problem: Safety: Goal: Ability to remain free from injury will improve 04/17/2020 1442 by Becky Augusta, RN Outcome: Adequate for Discharge 04/17/2020 1442 by Becky Augusta, RN Outcome: Adequate for Discharge   Problem: Skin Integrity: Goal: Risk for impaired skin integrity will decrease 04/17/2020 1442 by Becky Augusta, RN Outcome: Adequate for Discharge 04/17/2020 1442 by Becky Augusta, RN Outcome: Adequate for Discharge   Problem: Education: Goal: Knowledge of disease or condition will improve 04/17/2020 1442 by Becky Augusta, RN Outcome: Adequate for Discharge 04/17/2020 1442 by Becky Augusta, RN Outcome: Adequate for Discharge Goal: Knowledge of secondary prevention will improve 04/17/2020 1442 by Becky Augusta, RN Outcome: Adequate for Discharge 04/17/2020 1442 by Becky Augusta, RN Outcome: Adequate for Discharge Goal: Knowledge of patient specific risk factors addressed and post discharge goals established will improve 04/17/2020 1442 by Becky Augusta, RN Outcome: Adequate for Discharge 04/17/2020 1442 by Becky Augusta, RN Outcome: Adequate for Discharge Goal: Individualized Educational Video(s) 04/17/2020 1442 by Becky Augusta, RN Outcome: Adequate for Discharge 04/17/2020 1442 by Becky Augusta, RN Outcome: Adequate for Discharge   Problem: Coping: Goal: Will identify appropriate support needs 04/17/2020 1442 by Becky Augusta,  RN Outcome: Adequate for Discharge 04/17/2020 1442 by Becky Augusta, RN Outcome: Adequate for Discharge

## 2020-04-17 NOTE — Progress Notes (Addendum)
  Speech Language Pathology Treatment: Dysphagia  Patient Details Name: Vicki Swanson MRN: [id number] DOB: 06/25/1945 Today's Date: 04/17/2020 Time: 1031-2811 SLP Time Calculation (min) (ACUTE ONLY): 11 min  Assessment / Plan / Recommendation Clinical Impression  Pt was seen for dysphagia treatment and was cooperative throughout the session. Her mentation appears improved compared to the initial evaluation and she reported that it is back to her baseline. Pt reported that she has been tolerating the current diet without overt s/sx of aspiration. Pt tolerated regular texture solids and thin liquids via straw using consecutive swallows without symptoms of oropharyngeal dysphagia. It is recommended that her diet be advanced to regular texture solids and thin liquids. Further skilled SLP services are not clinically indicated at this time.    HPI HPI: Pt is an 74 y.o. female with PMH of COPD, GERD, HTN who was admitted on 12/10 for weakness/fatigue, confusion and dehydration. She was found to be positive for influenza and was hypotensive upon arrival with AKI. Rapid Response called on 12/11 due to facial droop and aphasia which resolved by the time of neurology's assessment. Neurology ?facial spasm due to trigeminal neuralgia. Pt passed yale on 12/10 prior to symptoms and was made NPO on 12/11. MRI brain negative for acute changes. MRA: Severe stenosis left ICA cavernous segment., up to moderate stenosis at the Basilar tip, with moderate to severe bilateral PCA P1 stenoses. 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 Pt found to have possible UTI.      SLP Plan  Discharge SLP treatment due to (comment);All goals met       Recommendations  Diet recommendations: Regular;Thin liquid Liquids provided via: Cup;Straw Medication Administration: Whole meds with  liquid Supervision: Patient able to self feed Postural Changes and/or Swallow Maneuvers: Seated upright 90 degrees                Oral Care Recommendations: Oral care BID Follow up Recommendations: None SLP Visit Diagnosis: Dysphagia, unspecified (R13.10) Plan: Discharge SLP treatment due to (comment);All goals met       Vicki Swanson Negus, [place], [place] Office number [phone] Pager [place] 04/17/2020, 9:29 AM

## 2020-04-17 NOTE — Plan of Care (Signed)
  Problem: Education: Goal: Knowledge of General Education information will improve Description: Including pain rating scale, medication(s)/side effects and non-pharmacologic comfort measures Outcome: Adequate for Discharge   Problem: Health Behavior/Discharge Planning: Goal: Ability to manage health-related needs will improve Outcome: Adequate for Discharge   Problem: Clinical Measurements: Goal: Ability to maintain clinical measurements within normal limits will improve Outcome: Adequate for Discharge Goal: Will remain free from infection Outcome: Adequate for Discharge Goal: Diagnostic test results will improve Outcome: Adequate for Discharge Goal: Respiratory complications will improve Outcome: Adequate for Discharge Goal: Cardiovascular complication will be avoided Outcome: Adequate for Discharge   Problem: Activity: Goal: Risk for activity intolerance will decrease Outcome: Adequate for Discharge   Problem: Nutrition: Goal: Adequate nutrition will be maintained Outcome: Adequate for Discharge   Problem: Coping: Goal: Level of anxiety will decrease Outcome: Adequate for Discharge   Problem: Elimination: Goal: Will not experience complications related to bowel motility Outcome: Adequate for Discharge Goal: Will not experience complications related to urinary retention Outcome: Adequate for Discharge   Problem: Pain Managment: Goal: General experience of comfort will improve Outcome: Adequate for Discharge   Problem: Safety: Goal: Ability to remain free from injury will improve Outcome: Adequate for Discharge   Problem: Skin Integrity: Goal: Risk for impaired skin integrity will decrease Outcome: Adequate for Discharge   Problem: Education: Goal: Knowledge of disease or condition will improve Outcome: Adequate for Discharge Goal: Knowledge of secondary prevention will improve Outcome: Adequate for Discharge Goal: Knowledge of patient specific risk factors  addressed and post discharge goals established will improve Outcome: Adequate for Discharge Goal: Individualized Educational Video(s) Outcome: Adequate for Discharge   Problem: Coping: Goal: Will identify appropriate support needs Outcome: Adequate for Discharge

## 2020-04-17 NOTE — Progress Notes (Signed)
Physical Therapy Treatment Patient Details Name: Vicki Swanson MRN: [id number] DOB: 1946-02-27 Today's Date: 04/17/2020    History of Present Illness 74 y.o. female with medical history significant of hypertension, COPD, GERD who presented to med [facility] with c/o diarrhea, fever, weakness, and fatigue. Pt admitted with acute influenza infection as well as acute kidney injury. Code stroke called 12/11. MRI negative. Code stroke canceled due to symptoms resolved.    PT Comments    Pt is making significant progress towards her goals as she was able to perform all bed mob independently this date. She required supervision for transfers and ambulating within the room without an AD/AE. She does display some balance and strength deficits through her tendency to sway in standing and walking and her decreased bilat foot clearance with swing, especially on the L. She demonstrates difficulty obtaining and maintaining SLS with 1 UE support and most likely would be unable to do so without UE support. Pt and pt's husband educated on providing min guard assist for ambulating in the home with and without the RW and especially with stair negotiation. Educated them on proper guarding of pt with stairs. Will follow up acutely. Current recommendations remain appropriate.   Follow Up Recommendations  Home health PT;Supervision for mobility/OOB     Equipment Recommendations  Rolling walker with 5" wheels;3in1 (PT)    Recommendations for Other Services       Precautions / Restrictions Precautions Precautions: Fall Precaution Comments: watch BP Restrictions Weight Bearing Restrictions: No    Mobility  Bed Mobility Overal bed mobility: Independent Bed Mobility: Supine to Sit;Sit to Supine     Supine to sit: Independent Sit to supine: Independent   General bed mobility comments: Pt able to transition supine <> sit EOB safely with slightly increased time with bed flat and no use of  rails.  Transfers Overall transfer level: Needs assistance Equipment used: None Transfers: Sit to/from Stand Sit to Stand: Supervision         General transfer comment: Sit to stand from low EOB with supervision for safety, but no overt LOB.  Ambulation/Gait Ambulation/Gait assistance: Supervision Gait Distance (Feet): 90 Feet Assistive device: None Gait Pattern/deviations: Step-through pattern;Decreased stride length;Decreased dorsiflexion - right;Decreased dorsiflexion - left;Wide base of support Gait velocity: WFL Gait velocity interpretation: >2.62 ft/sec, indicative of community ambulatory General Gait Details: Ambulates with bilat legs externally rotated and poor bilat ankle dorsiflexion with decreased L foot clearance compared to R. Mild trunk sway but no overt LOB.   Stairs             Wheelchair Mobility    Modified Rankin (Stroke Patients Only)       Balance Overall balance assessment: Needs assistance Sitting-balance support: Feet supported Sitting balance-Leahy Scale: Good Sitting balance - Comments: Static sitting EOB safely no LOB with supervision.   Standing balance support: No upper extremity supported;During functional activity Standing balance-Leahy Scale: Good Standing balance comment: Pt able to ambulate without UE support with mild trunk sway, but no overt LOB. Pt displays difficulty maintaining balance in SLS.                            Cognition Arousal/Alertness: Awake/alert Behavior During Therapy: WFL for tasks assessed/performed Overall Cognitive Status: Within Functional Limits for tasks assessed  General Comments: Demonstrates good safety awareness.      Exercises      General Comments General comments (skin integrity, edema, etc.): BP 180/77 start of session, RN made aware who cleared pt for session      Pertinent Vitals/Pain Pain Assessment: Faces Faces Pain Scale:  Hurts a little bit Pain Location: headache Pain Descriptors / Indicators: Discomfort Pain Intervention(s): Limited activity within patient's tolerance;Monitored during session;Repositioned;RN gave pain meds during session    Home Living                      Prior Function            PT Goals (current goals can now be found in the care plan section) Acute Rehab PT Goals Patient Stated Goal: to go home PT Goal Formulation: With patient/family Time For Goal Achievement: 04/27/20 Potential to Achieve Goals: Good Progress towards PT goals: Progressing toward goals    Frequency    Min 3X/week      PT Plan Current plan remains appropriate;Equipment recommendations need to be updated    Co-evaluation              AM-PAC PT "6 Clicks" Mobility   Outcome Measure  Help needed turning from your back to your side while in a flat bed without using bedrails?: None Help needed moving from lying on your back to sitting on the side of a flat bed without using bedrails?: None Help needed moving to and from a bed to a chair (including a wheelchair)?: A Little Help needed standing up from a chair using your arms (e.g., wheelchair or bedside chair)?: A Little Help needed to walk in hospital room?: A Little Help needed climbing 3-5 steps with a railing? : A Lot 6 Click Score: 19    End of Session Equipment Utilized During Treatment: Gait belt Activity Tolerance: Patient tolerated treatment well Patient left: in bed;with call bell/phone within reach;with bed alarm set;with family/visitor present Nurse Communication: Mobility status;Other (comment) (BP) PT Visit Diagnosis: Other abnormalities of gait and mobility (R26.89);Muscle weakness (generalized) (M62.81);Pain;Unsteadiness on feet (R26.81) Pain - Right/Left:  (headache) Pain - part of body:  (headache)     Time: 2563-8937 PT Time Calculation (min) (ACUTE ONLY): 17 min  Charges:  $Therapeutic Activity: 8-22 mins                      Raymond Gurney, PT, DPT Acute Rehabilitation Services  Pager: [phone] Office: [phone]    Jewel Baize 04/17/2020, 1:25 PM

## 2020-04-29 ENCOUNTER — Other Ambulatory Visit: Payer: Self-pay

## 2020-04-29 ENCOUNTER — Encounter: Payer: Self-pay | Admitting: Internal Medicine

## 2020-04-29 ENCOUNTER — Ambulatory Visit (INDEPENDENT_AMBULATORY_CARE_PROVIDER_SITE_OTHER): Payer: Medicare Other | Admitting: Emergency Medicine

## 2020-04-29 DIAGNOSIS — I639 Cerebral infarction, unspecified: Secondary | ICD-10-CM

## 2020-04-29 LAB — CUP PACEART INCLINIC DEVICE CHECK
Date Time Interrogation Session: 20211228130812
Implantable Pulse Generator Implant Date: 20211215

## 2020-04-29 MED ORDER — APIXABAN 2.5 MG PO TABS: 2.5000 mg | ORAL_TABLET | Freq: Two times a day (BID) | ORAL | 3 refills | Status: DC

## 2020-04-29 NOTE — Patient Instructions (Addendum)
Stop taking your Aspirin and your Plavix today. Take Eliquis 2.5 mg twice a day. You will receive a call from that Atrial Fibrillation Clinic to set up an appointment to be seen by them.

## 2020-04-29 NOTE — Progress Notes (Signed)
ILR wound check in clinic. Steri strips removed. Wound well healed. Home monitor not transmitting nightly , patient listed as having Carelink APP but has Relay home monitor. AT/AF burden 34.3%, 38  episodes of AF , longest 61 hours, presenting rhythm AF. 12 lead EKG done and reviewed by Dr Ladona Ridgel. Eliquis ordered and referral to AF Clinic. Questions answered.

## 2020-05-13 ENCOUNTER — Telehealth: Payer: Self-pay | Admitting: Physician Assistant

## 2020-05-13 NOTE — Telephone Encounter (Signed)
Carelink reviewed. Presenting rhythm is AFib 110's-120's HR are fast, generally 150's, I do not see any brady events no beginning or ends to her episodes, suspect likely is an ongoing episode. Called the patient,  she had been seeing Dr. Desma Maxim for cardiology, discussed her preference for cardiology follow up, assuring her we could provide his office with the information and they could manage her,  she would like to continue with our office going forward. she feels well, has a vague awareness of something in her chest, not pain, not overt palpitations.  No SOB.  tends to just rest and relax and seems to settle. She is taking the Eliqus BID BP today is 118/84 and HR 115 by her cuff. She and her husband are on the phone We will increase her Propanolol to 40mg  BID and reduce her Lisinopril to 10mg  daily.  They have written the instructions down Moved her AF clinic visit from next week to Thursday 1/13/ at 3:30. They both report understanding of the instructions. I will ask the AFib clinic to give her a call with clinic location and parking instructions tomorrow.  , PA-C

## 2020-05-15 ENCOUNTER — Other Ambulatory Visit: Payer: Self-pay

## 2020-05-15 ENCOUNTER — Ambulatory Visit (HOSPITAL_COMMUNITY)
Admission: RE | Admit: 2020-05-15 | Discharge: 2020-05-15 | Disposition: A | Discharge status: Home / Self Care | Payer: Medicare Other | Source: Ambulatory Visit | Attending: Physician Assistant | Admitting: Physician Assistant

## 2020-05-15 ENCOUNTER — Encounter (HOSPITAL_COMMUNITY): Payer: Self-pay | Admitting: Physician Assistant

## 2020-05-15 VITALS — BP 100/58 | HR 155 | Ht 60.0 in | Wt 140.6 lb

## 2020-05-15 DIAGNOSIS — Z8673 Personal history of transient ischemic attack (TIA), and cerebral infarction without residual deficits: Secondary | ICD-10-CM | POA: Diagnosis not present

## 2020-05-15 DIAGNOSIS — D6869 Other thrombophilia: Secondary | ICD-10-CM

## 2020-05-15 DIAGNOSIS — K219 Gastro-esophageal reflux disease without esophagitis: Secondary | ICD-10-CM | POA: Insufficient documentation

## 2020-05-15 DIAGNOSIS — Z79899 Other long term (current) drug therapy: Secondary | ICD-10-CM | POA: Diagnosis not present

## 2020-05-15 DIAGNOSIS — Z7901 Long term (current) use of anticoagulants: Secondary | ICD-10-CM | POA: Diagnosis not present

## 2020-05-15 DIAGNOSIS — I4819 Other persistent atrial fibrillation: Secondary | ICD-10-CM

## 2020-05-15 DIAGNOSIS — J449 Chronic obstructive pulmonary disease, unspecified: Secondary | ICD-10-CM | POA: Insufficient documentation

## 2020-05-15 DIAGNOSIS — I10 Essential (primary) hypertension: Secondary | ICD-10-CM | POA: Diagnosis not present

## 2020-05-15 DIAGNOSIS — Z9049 Acquired absence of other specified parts of digestive tract: Secondary | ICD-10-CM | POA: Diagnosis not present

## 2020-05-15 LAB — CBC
HCT: 39.7 % (ref 36.0–46.0)
Hemoglobin: 13.1 g/dL (ref 12.0–15.0)
MCH: 30.9 pg (ref 26.0–34.0)
MCHC: 33 g/dL (ref 30.0–36.0)
MCV: 93.6 fL (ref 80.0–100.0)
Platelets: 215 10*3/uL (ref 150–400)
RBC: 4.24 MIL/uL (ref 3.87–5.11)
RDW: 14.3 % (ref 11.5–15.5)
WBC: 8.5 10*3/uL (ref 4.0–10.5)
nRBC: 0 % (ref 0.0–0.2)

## 2020-05-15 LAB — BASIC METABOLIC PANEL
Anion gap: 9 (ref 5–15)
BUN: 20 mg/dL (ref 8–23)
CO2: 22 mmol/L (ref 22–32)
Calcium: 9.3 mg/dL (ref 8.9–10.3)
Chloride: 109 mmol/L (ref 98–111)
Creatinine, Ser: 1.4 mg/dL — ABNORMAL HIGH (ref 0.44–1.00)
GFR, Estimated: 39 mL/min — ABNORMAL LOW (ref >60)
Glucose, Bld: 97 mg/dL (ref 70–99)
Potassium: 4.7 mmol/L (ref 3.5–5.1)
Sodium: 140 mmol/L (ref 135–145)

## 2020-05-15 MED ORDER — METOPROLOL TARTRATE 50 MG PO TABS: 50.0000 mg | ORAL_TABLET | Freq: Two times a day (BID) | ORAL | 3 refills | Status: DC

## 2020-05-15 NOTE — Patient Instructions (Signed)
Cardioversion scheduled for Thursday, January 20th  - Arrive at the [facility] and go to admitting at 830AM  - Do not eat or drink anything after midnight the night prior to your procedure.  - Take all your morning medication (except diabetic medications) with a sip of water prior to arrival.  - You will not be able to drive home after your procedure.  - Do NOT miss any doses of your blood thinner - if you should miss a dose please notify our office immediately.  - If you feel as if you go back into normal rhythm prior to scheduled cardioversion, please notify our office immediately. If your procedure is canceled in the cardioversion suite you will be charged a cancellation fee.    Stop lisinopril  Stop propranolol  Start Metoprolol 50mg  twice a day start tonight

## 2020-05-15 NOTE — H&P (View-Only) (Signed)
Primary Care Physician: Si Gaul, DO Primary Electrophysiologist: Dr Elberta Fortis  Referring Physician: Dr Elberta Fortis   Vicki Swanson is a 75 y.o. female with a history of HTN, COPD, GERD, CVA, and atrial fibrillation who presents for follow up in the [facility]. The patient was admitted on 04/11/2020 with weakness, fatigue, confusion and dehydration. Flu +. She developed transient R facial droop, slurred speech, and aphasia in the hospital. An ILR was placed to monitor for afib. The patient was initially diagnosed with atrial fibrillation 04/29/20 on ILR. Patient was started on Eliquis for a CHADS2VASC score of 6. The device clinic received an alert for ongoing afib with RVR. She does have symptoms of increased fatigue but otherwise feels well. She denies any missed doses of anticoagulation. Her propranolol was increased on 05/13/20.  Today, she denies symptoms of palpitations, chest pain, shortness of breath, orthopnea, PND, lower extremity edema, dizziness, presyncope, syncope, snoring, daytime somnolence, bleeding, or neurologic sequela. The patient is tolerating medications without difficulties and is otherwise without complaint today.    Atrial Fibrillation Risk Factors:  she does not have symptoms or diagnosis of sleep apnea. she does not have a history of rheumatic fever.   she has a BMI of Body mass index is 27.46 kg/m.Marland Kitchen Filed Weights   05/15/20 1530  Weight: 63.8 kg    No family history on file.   Atrial Fibrillation Management history:  Previous antiarrhythmic drugs: none Previous cardioversions: none Previous ablations: none CHADS2VASC score: 6 Anticoagulation history: Eliquis   Past Medical History:  Diagnosis Date  . COPD (chronic obstructive pulmonary disease) (HCC)   . GERD (gastroesophageal reflux disease)   . Hypertension    Past Surgical History:  Procedure Laterality Date  . ABDOMINAL HYSTERECTOMY    . BREAST SURGERY     . CHOLECYSTECTOMY    . LOOP RECORDER INSERTION N/A 04/16/2020   Procedure: LOOP RECORDER INSERTION;  Surgeon: Regan Lemming, MD;  Location: [facility] INVASIVE CV LAB;  Service: Cardiovascular;  Laterality: N/A;  . TONSILLECTOMY      Current Outpatient Medications  Medication Sig Dispense Refill  . apixaban (ELIQUIS) 2.5 MG TABS tablet Take 1 tablet (2.5 mg total) by mouth 2 (two) times daily. Patient has card for free 30 day supply. 60 tablet 3  . ascorbic acid (VITAMIN C) 500 MG tablet Take 500 mg by mouth daily.    Marland Kitchen atorvastatin (LIPITOR) 40 MG tablet Take 1 tablet (40 mg total) by mouth at bedtime. 30 tablet 0  . B Complex Vitamins (VITAMIN B COMPLEX) TABS Take 1 tablet by mouth daily.    Marland Kitchen gabapentin (NEURONTIN) 100 MG capsule Take 1 capsule (100 mg total) by mouth 3 (three) times daily. 60 capsule 0  . lisinopril (ZESTRIL) 20 MG tablet Take 10 mg by mouth daily.    . metoprolol tartrate (LOPRESSOR) 50 MG tablet Take 1 tablet (50 mg total) by mouth 2 (two) times daily. 60 tablet 3  . pantoprazole (PROTONIX) 40 MG tablet Take 40 mg by mouth daily before breakfast.    . potassium chloride SA (KLOR-CON) 20 MEQ tablet Take 20 mEq by mouth daily.    . sertraline (ZOLOFT) 100 MG tablet Take 100 mg by mouth daily.    Marland Kitchen topiramate (TOPAMAX) 25 MG tablet Take 50 mg by mouth 2 (two) times daily.     No current facility-administered medications for this encounter.    No Known Allergies  Social History   Socioeconomic History  .  Marital status: Married    Spouse name: Not on file  . Number of children: Not on file  . Years of education: Not on file  . Highest education level: Not on file  Occupational History  . Not on file  Tobacco Use  . Smoking status: Never Smoker  . Smokeless tobacco: Never Used  Substance and Sexual Activity  . Alcohol use: Never  . Drug use: Never  . Sexual activity: Not on file  Other Topics Concern  . Not on file  Social History Narrative  . Not on  file   Social Determinants of Health   Financial Resource Strain: Not on file  Food Insecurity: Not on file  Transportation Needs: Not on file  Physical Activity: Not on file  Stress: Not on file  Social Connections: Not on file  Intimate Partner Violence: Not on file     ROS- All systems are reviewed and negative except as per the HPI above.  Physical Exam: Vitals:   05/15/20 1530  BP: (!) 100/58  Pulse: (!) 155  Weight: 63.8 kg  Height: 5' (1.524 m)    GEN- The patient is well appearing elderly female, alert and oriented x 3 today.   Head- normocephalic, atraumatic Eyes-  Sclera clear, conjunctiva pink Ears- hearing intact Oropharynx- clear Neck- supple  Lungs- Clear to ausculation bilaterally, normal work of breathing Heart- irregular rate and rhythm, tachycardia, no murmurs, rubs or gallops  GI- soft, NT, ND, + BS Extremities- no clubbing, cyanosis, or edema MS- no significant deformity or atrophy Skin- no rash or lesion Psych- euthymic mood, full affect Neuro- strength and sensation are intact  Wt Readings from Last 3 Encounters:  05/15/20 63.8 kg  04/12/20 69.7 kg    EKG today demonstrates  Afib RVR Vent. rate 155 BPM QRS duration 66 ms QT/QTc 306/491 ms  Echo 04/14/20 demonstrated  1. Left ventricular ejection fraction, by estimation, is 65 to 70%. The  left ventricle has normal function. The left ventricle has no regional  wall motion abnormalities. There is mild left ventricular hypertrophy.  Left ventricular diastolic parameters  are consistent with Grade II diastolic dysfunction (pseudonormalization).  2. Right ventricular systolic function is normal. The right ventricular  size is normal. Tricuspid regurgitation signal is inadequate for assessing  PA pressure.  3. Left atrial size was mildly dilated.  4. Right atrial size was mildly dilated.  5. The mitral valve is normal in structure. Trivial mitral valve  regurgitation. No evidence of  mitral stenosis.  6. The aortic valve is tricuspid. Aortic valve regurgitation is not  visualized. Mild aortic valve sclerosis is present, with no evidence of  aortic valve stenosis.  7. The inferior vena cava is normal in size with greater than 50%  respiratory variability, suggesting right atrial pressure of 3 mmHg.   Epic records are reviewed at length today  CHA2DS2-VASc Score = 6  The patient's score is based upon: CHF History: No HTN History: Yes Diabetes History: No Stroke History: Yes Vascular Disease History: No Age Score: 2 Gender Score: 1      ASSESSMENT AND PLAN: 1. Persistent Atrial Fibrillation The patient's CHA2DS2-VASc score is 6, indicating a 9.7% annual risk of stroke.  General education about afib provided and questions answered. We also discussed her stroke risk and the risks and benefits of anticoagulation. ILR now shows persistent afib with 100% burden. We discussed therapeutic options including DCCV. Will arrange DCCV after 3 weeks of uninterrupted anticoagulation (on or after 05/20/20).  ED precautions given if she should have clinical deterioration before then.  Continue Eliquis 5 mg BID. She denies any missed doses.  Will stop propranolol and start metoprolol 50 mg BID  2. Secondary Hypercoagulable State (ICD10:  D68.69) The patient is at significant risk for stroke/thromboembolism based upon her CHA2DS2-VASc Score of 6.  Continue Apixaban (Eliquis).   3. HTN Borderline low today. Will stop lisinopril to accommodate rate control   Follow up in the AF clinic one week post DCCV.    Jorja Loa PA-C Afib Clinic [facility] [address] [place], [place] [address] [phone] 05/15/2020 4:08 PM

## 2020-05-15 NOTE — Progress Notes (Signed)
Primary Care Physician: Si Gaul, DO Primary Electrophysiologist: Dr Elberta Fortis  Referring Physician: Dr Elberta Fortis   Vicki Swanson is a 75 y.o. female with a history of HTN, COPD, GERD, CVA, and atrial fibrillation who presents for follow up in the [facility]. The patient was admitted on 04/11/2020 with weakness, fatigue, confusion and dehydration. Flu +. She developed transient R facial droop, slurred speech, and aphasia in the hospital. An ILR was placed to monitor for afib. The patient was initially diagnosed with atrial fibrillation 04/29/20 on ILR. Patient was started on Eliquis for a CHADS2VASC score of 6. The device clinic received an alert for ongoing afib with RVR. She does have symptoms of increased fatigue but otherwise feels well. She denies any missed doses of anticoagulation. Her propranolol was increased on 05/13/20.  Today, she denies symptoms of palpitations, chest pain, shortness of breath, orthopnea, PND, lower extremity edema, dizziness, presyncope, syncope, snoring, daytime somnolence, bleeding, or neurologic sequela. The patient is tolerating medications without difficulties and is otherwise without complaint today.    Atrial Fibrillation Risk Factors:  she does not have symptoms or diagnosis of sleep apnea. she does not have a history of rheumatic fever.   she has a BMI of Body mass index is 27.46 kg/m.Marland Kitchen Filed Weights   05/15/20 1530  Weight: 63.8 kg    No family history on file.   Atrial Fibrillation Management history:  Previous antiarrhythmic drugs: none Previous cardioversions: none Previous ablations: none CHADS2VASC score: 6 Anticoagulation history: Eliquis   Past Medical History:  Diagnosis Date  . COPD (chronic obstructive pulmonary disease) (HCC)   . GERD (gastroesophageal reflux disease)   . Hypertension    Past Surgical History:  Procedure Laterality Date  . ABDOMINAL HYSTERECTOMY    . BREAST SURGERY     . CHOLECYSTECTOMY    . LOOP RECORDER INSERTION N/A 04/16/2020   Procedure: LOOP RECORDER INSERTION;  Surgeon: Regan Lemming, MD;  Location: [facility] CV [facility];  Service: Cardiovascular;  Laterality: N/A;  . TONSILLECTOMY      Current Outpatient Medications  Medication Sig Dispense Refill  . apixaban (ELIQUIS) 2.5 MG TABS tablet Take 1 tablet (2.5 mg total) by mouth 2 (two) times daily. Patient has card for free 30 day supply. 60 tablet 3  . ascorbic acid (VITAMIN C) 500 MG tablet Take 500 mg by mouth daily.    Marland Kitchen atorvastatin (LIPITOR) 40 MG tablet Take 1 tablet (40 mg total) by mouth at bedtime. 30 tablet 0  . B Complex Vitamins (VITAMIN B COMPLEX) TABS Take 1 tablet by mouth daily.    Marland Kitchen gabapentin (NEURONTIN) 100 MG capsule Take 1 capsule (100 mg total) by mouth 3 (three) times daily. 60 capsule 0  . lisinopril (ZESTRIL) 20 MG tablet Take 10 mg by mouth daily.    . metoprolol tartrate (LOPRESSOR) 50 MG tablet Take 1 tablet (50 mg total) by mouth 2 (two) times daily. 60 tablet 3  . pantoprazole (PROTONIX) 40 MG tablet Take 40 mg by mouth daily before breakfast.    . potassium chloride SA (KLOR-CON) 20 MEQ tablet Take 20 mEq by mouth daily.    . sertraline (ZOLOFT) 100 MG tablet Take 100 mg by mouth daily.    Marland Kitchen topiramate (TOPAMAX) 25 MG tablet Take 50 mg by mouth 2 (two) times daily.     No current facility-administered medications for this encounter.    No Known Allergies  Social History   Socioeconomic History  .  Marital status: Married    Spouse name: Not on file  . Number of children: Not on file  . Years of education: Not on file  . Highest education level: Not on file  Occupational History  . Not on file  Tobacco Use  . Smoking status: Never Smoker  . Smokeless tobacco: Never Used  Substance and Sexual Activity  . Alcohol use: Never  . Drug use: Never  . Sexual activity: Not on file  Other Topics Concern  . Not on file  Social History Narrative  . Not on  file   Social Determinants of Health   Financial Resource Strain: Not on file  Food Insecurity: Not on file  Transportation Needs: Not on file  Physical Activity: Not on file  Stress: Not on file  Social Connections: Not on file  Intimate Partner Violence: Not on file     ROS- All systems are reviewed and negative except as per the HPI above.  Physical Exam: Vitals:   05/15/20 1530  BP: (!) 100/58  Pulse: (!) 155  Weight: 63.8 kg  Height: 5' (1.524 m)    GEN- The patient is well appearing elderly female, alert and oriented x 3 today.   Head- normocephalic, atraumatic Eyes-  Sclera clear, conjunctiva pink Ears- hearing intact Oropharynx- clear Neck- supple  Lungs- Clear to ausculation bilaterally, normal work of breathing Heart- irregular rate and rhythm, tachycardia, no murmurs, rubs or gallops  GI- soft, NT, ND, + BS Extremities- no clubbing, cyanosis, or edema MS- no significant deformity or atrophy Skin- no rash or lesion Psych- euthymic mood, full affect Neuro- strength and sensation are intact  Wt Readings from Last 3 Encounters:  05/15/20 63.8 kg  04/12/20 69.7 kg    EKG today demonstrates  Afib RVR Vent. rate 155 BPM QRS duration 66 ms QT/QTc 306/491 ms  Echo 04/14/20 demonstrated  1. Left ventricular ejection fraction, by estimation, is 65 to 70%. The  left ventricle has normal function. The left ventricle has no regional  wall motion abnormalities. There is mild left ventricular hypertrophy.  Left ventricular diastolic parameters  are consistent with Grade II diastolic dysfunction (pseudonormalization).  2. Right ventricular systolic function is normal. The right ventricular  size is normal. Tricuspid regurgitation signal is inadequate for assessing  PA pressure.  3. Left atrial size was mildly dilated.  4. Right atrial size was mildly dilated.  5. The mitral valve is normal in structure. Trivial mitral valve  regurgitation. No evidence of  mitral stenosis.  6. The aortic valve is tricuspid. Aortic valve regurgitation is not  visualized. Mild aortic valve sclerosis is present, with no evidence of  aortic valve stenosis.  7. The inferior vena cava is normal in size with greater than 50%  respiratory variability, suggesting right atrial pressure of 3 mmHg.   Epic records are reviewed at length today  CHA2DS2-VASc Score = 6  The patient's score is based upon: CHF History: No HTN History: Yes Diabetes History: No Stroke History: Yes Vascular Disease History: No Age Score: 2 Gender Score: 1      ASSESSMENT AND PLAN: 1. Persistent Atrial Fibrillation The patient's CHA2DS2-VASc score is 6, indicating a 9.7% annual risk of stroke.  General education about afib provided and questions answered. We also discussed her stroke risk and the risks and benefits of anticoagulation. ILR now shows persistent afib with 100% burden. We discussed therapeutic options including DCCV. Will arrange DCCV after 3 weeks of uninterrupted anticoagulation (on or after 05/20/20).  ED precautions given if she should have clinical deterioration before then.  Continue Eliquis 5 mg BID. She denies any missed doses.  Will stop propranolol and start metoprolol 50 mg BID  2. Secondary Hypercoagulable State (ICD10:  D68.69) The patient is at significant risk for stroke/thromboembolism based upon her CHA2DS2-VASc Score of 6.  Continue Apixaban (Eliquis).   3. HTN Borderline low today. Will stop lisinopril to accommodate rate control   Follow up in the AF clinic one week post DCCV.    Ricky Trestin Vences PA-C [facility] [address] [place], [place] [address] [phone] 05/15/2020 4:08 PM  

## 2020-05-19 ENCOUNTER — Telehealth: Payer: Self-pay | Admitting: Physician Assistant

## 2020-05-19 ENCOUNTER — Ambulatory Visit (INDEPENDENT_AMBULATORY_CARE_PROVIDER_SITE_OTHER): Payer: Medicare Other

## 2020-05-19 DIAGNOSIS — I639 Cerebral infarction, unspecified: Secondary | ICD-10-CM | POA: Diagnosis not present

## 2020-05-19 NOTE — Telephone Encounter (Signed)
  1. Has your device fired? no  2. Is you device beeping? no  3. Are you experiencing draining or swelling at device site? no  4. Are you calling to see if we received your device transmission? no  5. Have you passed out? No   Patient states she does not know how to send a transmission    Please route to Device Clinic Pool

## 2020-05-19 NOTE — Telephone Encounter (Signed)
Patient was concerned about scheduled home remote. Education done that remotes automatically are sent as long as she sleeps within 3-6 feet of her monitor and she will receive a cal if the remote does not go through for some reason.

## 2020-05-20 ENCOUNTER — Other Ambulatory Visit (HOSPITAL_COMMUNITY)
Admission: RE | Admit: 2020-05-20 | Discharge: 2020-05-20 | Disposition: A | Discharge status: Home / Self Care | Payer: Medicare Other | Source: Ambulatory Visit | Attending: Cardiology | Admitting: Cardiology

## 2020-05-20 ENCOUNTER — Ambulatory Visit (HOSPITAL_COMMUNITY): Payer: Medicare Other | Admitting: Physician Assistant

## 2020-05-20 DIAGNOSIS — Z01812 Encounter for preprocedural laboratory examination: Secondary | ICD-10-CM | POA: Diagnosis present

## 2020-05-20 DIAGNOSIS — Z20822 Contact with and (suspected) exposure to covid-19: Secondary | ICD-10-CM | POA: Diagnosis not present

## 2020-05-21 LAB — SARS CORONAVIRUS 2 (TAT 6-24 HRS): SARS Coronavirus 2: NEGATIVE

## 2020-05-21 NOTE — Progress Notes (Signed)
Pre call done for cardioversion tomorrow. Patient states she has been quarantined since covid test, has not missed any doses of her blood thinner and will take in morning, will be NPO in morning,and confirmed has a ride home post procedure. All questions addressed.

## 2020-05-22 ENCOUNTER — Encounter (HOSPITAL_COMMUNITY)
Admission: RE | Disposition: A | Discharge status: Home / Self Care | Payer: Self-pay | Source: Home / Self Care | Attending: Cardiology

## 2020-05-22 ENCOUNTER — Ambulatory Visit (HOSPITAL_COMMUNITY)
Admission: RE | Admit: 2020-05-22 | Discharge: 2020-05-22 | Disposition: A | Discharge status: Home / Self Care | Payer: Medicare Other | Attending: Cardiology | Admitting: Cardiology

## 2020-05-22 ENCOUNTER — Other Ambulatory Visit: Payer: Self-pay

## 2020-05-22 ENCOUNTER — Ambulatory Visit (HOSPITAL_COMMUNITY): Payer: Medicare Other | Admitting: Anesthesiology

## 2020-05-22 ENCOUNTER — Encounter (HOSPITAL_COMMUNITY): Payer: Self-pay | Admitting: Cardiology

## 2020-05-22 DIAGNOSIS — D6869 Other thrombophilia: Secondary | ICD-10-CM | POA: Insufficient documentation

## 2020-05-22 DIAGNOSIS — I10 Essential (primary) hypertension: Secondary | ICD-10-CM | POA: Diagnosis not present

## 2020-05-22 DIAGNOSIS — Z79899 Other long term (current) drug therapy: Secondary | ICD-10-CM | POA: Insufficient documentation

## 2020-05-22 DIAGNOSIS — I4819 Other persistent atrial fibrillation: Secondary | ICD-10-CM | POA: Diagnosis not present

## 2020-05-22 DIAGNOSIS — Z7901 Long term (current) use of anticoagulants: Secondary | ICD-10-CM | POA: Diagnosis not present

## 2020-05-22 HISTORY — PX: CARDIOVERSION: SHX1299

## 2020-05-22 SURGERY — CARDIOVERSION
Anesthesia: General

## 2020-05-22 MED ORDER — PROPOFOL 10 MG/ML IV BOLUS
INTRAVENOUS | Status: DC | PRN
Start: 2020-05-22 — End: 2020-05-22
  Administered 2020-05-22: 65 mg via INTRAVENOUS

## 2020-05-22 MED ORDER — LACTATED RINGERS IV SOLN: INTRAVENOUS | Status: DC | PRN

## 2020-05-22 MED ORDER — SODIUM CHLORIDE 0.9 % IV SOLN: Freq: Once | INTRAVENOUS | Status: AC

## 2020-05-22 MED ORDER — LIDOCAINE 2% (20 MG/ML) 5 ML SYRINGE
INTRAMUSCULAR | Status: DC | PRN
  Administered 2020-05-22: 60 mg via INTRAVENOUS

## 2020-05-22 NOTE — Anesthesia Postprocedure Evaluation (Signed)
Anesthesia Post Note  Patient: Vicki Swanson  Procedure(s) Performed: CARDIOVERSION (N/A )     Patient location during evaluation: PACU Anesthesia Type: General Level of consciousness: awake and alert, oriented and patient cooperative Pain management: pain level controlled Vital Signs Assessment: post-procedure vital signs reviewed and stable Respiratory status: spontaneous breathing, nonlabored ventilation and respiratory function stable Cardiovascular status: blood pressure returned to baseline and stable Postop Assessment: no apparent nausea or vomiting Anesthetic complications: no   No complications documented.  Last Vitals:  Vitals:   05/22/20 0946 05/22/20 0950  BP: (!) 113/59   Pulse: (!) 41 98  Resp: (!) 21 19  Temp: 36.6 C   SpO2: 100% 100%    Last Pain:  Vitals:   05/22/20 0946  TempSrc: Axillary  PainSc: 0-No pain                 Lannie Fields

## 2020-05-22 NOTE — CV Procedure (Addendum)
.     Electrical Cardioversion Procedure Note Vicki Swanson [id number] [birth year]  Procedure: Electrical Cardioversion Indications:  Atrial Fibrillation  Time Out: Verified patient identification, verified procedure,medications/allergies/relevent history reviewed, required imaging and test results available.  Performed  Procedure Details  The patient was NPO after midnight. Anesthesia was administered at the beside  by Dr. Salvadore Farber with propofol.  Cardioversion was performed with synchronized biphasic defibrillation via AP pads with 200 joules.  1 attempt(s) were performed.  The patient converted to normal sinus rhythm. The patient tolerated the procedure well   IMPRESSION:  Successful cardioversion of atrial fibrillation    Donato Schultz, MD

## 2020-05-22 NOTE — Interval H&P Note (Signed)
History and Physical Interval Note:  05/22/2020 9:06 AM  Vicki Swanson  has presented today for surgery, with the diagnosis of AFIB.  The various methods of treatment have been discussed with the patient and family. After consideration of risks, benefits and other options for treatment, the patient has consented to  Procedure(s): CARDIOVERSION (N/A) as a surgical intervention.  The patient's history has been reviewed, patient examined, no change in status, stable for surgery.  I have reviewed the patient's chart and labs.  Questions were answered to the patient's satisfaction.     [employer]

## 2020-05-22 NOTE — CV Procedure (Signed)
    Electrical Cardioversion Procedure Note Vicki Swanson [id number] [birth year]  Procedure: Electrical Cardioversion Indications:  Atrial Fibrillation  Time Out: Verified patient identification, verified procedure,medications/allergies/relevent history reviewed, required imaging and test results available.  Performed  Procedure Details  The patient was NPO after midnight. Anesthesia was administered at the beside  by Dr. Ladene Artist with propofol.  Cardioversion was performed with synchronized biphasic defibrillation via AP pads with 200 joules.  1 attempt(s) were performed.  The patient converted to normal sinus rhythm. The patient tolerated the procedure well   IMPRESSION:  Successful cardioversion of atrial fibrillation    Donato Schultz 05/22/2020, 9:36 AM

## 2020-05-22 NOTE — Anesthesia Procedure Notes (Signed)
Procedure Name: General with mask airway Date/Time: 05/22/2020 9:35 AM Performed by: Colon Flattery, CRNA Pre-anesthesia Checklist: Patient identified, Emergency Drugs available, Suction available and Patient being monitored Patient Re-evaluated:Patient Re-evaluated prior to induction Oxygen Delivery Method: Ambu bag Preoxygenation: Pre-oxygenation with 100% oxygen Induction Type: IV induction Placement Confirmation: positive ETCO2 Dental Injury: Teeth and Oropharynx as per pre-operative assessment

## 2020-05-22 NOTE — Anesthesia Preprocedure Evaluation (Addendum)
Anesthesia Evaluation  Patient identified by MRN, date of birth, ID band Patient awake    Reviewed: Allergy & Precautions, NPO status , Patient's Chart, lab work & pertinent test results, reviewed documented beta blocker date and time   Airway Mallampati: II  TM Distance: >3 FB Neck ROM: Full    Dental  (+) Teeth Intact, Dental Advisory Given   Pulmonary COPD,    Pulmonary exam normal breath sounds clear to auscultation       Cardiovascular hypertension, Pt. on medications and Pt. on home beta blockers +CHF (grade 2 diastolic dysfunction)  + dysrhythmias (eliquis) Atrial Fibrillation  Rhythm:Irregular Rate:Normal  Echo 04/14/20: 1. Left ventricular ejection fraction, by estimation, is 65 to 70%. The  left ventricle has normal function. The left ventricle has no regional  wall motion abnormalities. There is mild left ventricular hypertrophy.  Left ventricular diastolic parameters  are consistent with Grade II diastolic dysfunction (pseudonormalization).  2. Right ventricular systolic function is normal. The right ventricular  size is normal. Tricuspid regurgitation signal is inadequate for assessing  PA pressure.  3. Left atrial size was mildly dilated.  4. Right atrial size was mildly dilated.  5. The mitral valve is normal in structure. Trivial mitral valve  regurgitation. No evidence of mitral stenosis.  6. The aortic valve is tricuspid. Aortic valve regurgitation is not  visualized. Mild aortic valve sclerosis is present, with no evidence of  aortic valve stenosis.  7. The inferior vena cava is normal in size with greater than 50%  respiratory variability, suggesting right atrial pressure of 3 mmHg.    Neuro/Psych negative neurological ROS  negative psych ROS   GI/Hepatic Neg liver ROS, GERD  Medicated and Controlled,  Endo/Other  negative endocrine ROS  Renal/GU Renal InsufficiencyRenal diseaseCr 1.4   negative genitourinary   Musculoskeletal negative musculoskeletal ROS (+)   Abdominal   Peds  Hematology negative hematology ROS (+)   Anesthesia Other Findings   Reproductive/Obstetrics negative OB ROS                            Anesthesia Physical Anesthesia Plan  ASA: III  Anesthesia Plan: General   Post-op Pain Management:    Induction: Intravenous  PONV Risk Score and Plan: TIVA and Treatment may vary due to age or medical condition  Airway Management Planned: Natural Airway and Mask  Additional Equipment: None  Intra-op Plan:   Post-operative Plan:   Informed Consent: I have reviewed the patients History and Physical, chart, labs and discussed the procedure including the risks, benefits and alternatives for the proposed anesthesia with the patient or authorized representative who has indicated his/her understanding and acceptance.       Plan Discussed with: CRNA  Anesthesia Plan Comments:        Anesthesia Quick Evaluation

## 2020-05-22 NOTE — Discharge Instructions (Signed)
Electrical Cardioversion Electrical cardioversion is the delivery of a jolt of electricity to restore a normal rhythm to the heart. A rhythm that is too fast or is not regular keeps the heart from pumping well. In this procedure, sticky patches or metal paddles are placed on the chest to deliver electricity to the heart from a device. This procedure may be done in an emergency if:  There is low or no blood pressure as a result of the heart rhythm.  Normal rhythm must be restored as fast as possible to protect the brain and heart from further damage.  It may save a life. This may also be a scheduled procedure for irregular or fast heart rhythms that are not immediately life-threatening. Tell a health care provider about:  Any allergies you have.  All medicines you are taking, including vitamins, herbs, eye drops, creams, and over-the-counter medicines.  Any problems you or family members have had with anesthetic medicines.  Any blood disorders you have.  Any surgeries you have had.  Any medical conditions you have.  Whether you are pregnant or may be pregnant. What are the risks? Generally, this is a safe procedure. However, problems may occur, including:  Allergic reactions to medicines.  A blood clot that breaks free and travels to other parts of your body.  The possible return of an abnormal heart rhythm within hours or days after the procedure.  Your heart stopping (cardiac arrest). This is rare. What happens before the procedure? Medicines  Your health care provider may have you start taking: ? Blood-thinning medicines (anticoagulants) so your blood does not clot as easily. ? Medicines to help stabilize your heart rate and rhythm.  Ask your health care provider about: ? Changing or stopping your regular medicines. This is especially important if you are taking diabetes medicines or blood thinners. ? Taking medicines such as aspirin and ibuprofen. These medicines can  thin your blood. Do not take these medicines unless your health care provider tells you to take them. ? Taking over-the-counter medicines, vitamins, herbs, and supplements. General instructions  Follow instructions from your health care provider about eating or drinking restrictions.  Plan to have someone take you home from the hospital or clinic.  If you will be going home right after the procedure, plan to have someone with you for 24 hours.  Ask your health care provider what steps will be taken to help prevent infection. These may include washing your skin with a germ-killing soap. What happens during the procedure?  An IV will be inserted into one of your veins.  Sticky patches (electrodes) or metal paddles may be placed on your chest.  You will be given a medicine to help you relax (sedative).  An electrical shock will be delivered. The procedure may vary among health care providers and hospitals.   What can I expect after the procedure?  Your blood pressure, heart rate, breathing rate, and blood oxygen level will be monitored until you leave the hospital or clinic.  Your heart rhythm will be watched to make sure it does not change.  You may have some redness on the skin where the shocks were given. Follow these instructions at home:  Do not drive for 24 hours if you were given a sedative during your procedure.  Take over-the-counter and prescription medicines only as told by your health care provider.  Ask your health care provider how to check your pulse. Check it often.  Rest for 48 hours after the procedure   or as told by your health care provider.  Avoid or limit your caffeine use as told by your health care provider.  Keep all follow-up visits as told by your health care provider. This is important. Contact a health care provider if:  You feel like your heart is beating too quickly or your pulse is not regular.  You have a serious muscle cramp that does not go  away. Get help right away if:  You have discomfort in your chest.  You are dizzy or you feel faint.  You have trouble breathing or you are short of breath.  Your speech is slurred.  You have trouble moving an arm or leg on one side of your body.  Your fingers or toes turn cold or blue. Summary  Electrical cardioversion is the delivery of a jolt of electricity to restore a normal rhythm to the heart.  This procedure may be done right away in an emergency or may be a scheduled procedure if the condition is not an emergency.  Generally, this is a safe procedure.  After the procedure, check your pulse often as told by your health care provider. This information is not intended to replace advice given to you by your health care provider. Make sure you discuss any questions you have with your health care provider. Document Revised: 11/20/2018 Document Reviewed: 11/20/2018 Elsevier Patient Education  2021 [employer]  

## 2020-05-22 NOTE — Transfer of Care (Signed)
Immediate Anesthesia Transfer of Care Note  Patient: Vicki Swanson  Procedure(s) Performed: CARDIOVERSION (N/A )  Patient Location: Endoscopy Unit  Anesthesia Type:General  Level of Consciousness: awake, alert  and oriented  Airway & Oxygen Therapy: Patient Spontanous Breathing and Patient connected to nasal cannula oxygen  Post-op Assessment: Report given to RN, Post -op Vital signs reviewed and stable and Patient moving all extremities  Post vital signs: Reviewed and stable  Last Vitals:  Vitals Value Taken Time  BP    Temp    Pulse    Resp    SpO2      Last Pain:  Vitals:   05/22/20 0845  TempSrc: Temporal  PainSc: 0-No pain         Complications: No complications documented.

## 2020-05-23 LAB — CUP PACEART REMOTE DEVICE CHECK
Date Time Interrogation Session: 20220120000105
Implantable Pulse Generator Implant Date: 20211215

## 2020-05-25 ENCOUNTER — Encounter (HOSPITAL_COMMUNITY): Payer: Self-pay | Admitting: Cardiology

## 2020-05-29 ENCOUNTER — Other Ambulatory Visit: Payer: Self-pay

## 2020-05-29 ENCOUNTER — Ambulatory Visit (HOSPITAL_COMMUNITY)
Admission: RE | Admit: 2020-05-29 | Discharge: 2020-05-29 | Disposition: A | Discharge status: Home / Self Care | Payer: Medicare Other | Source: Ambulatory Visit | Attending: Physician Assistant | Admitting: Physician Assistant

## 2020-05-29 ENCOUNTER — Encounter (HOSPITAL_COMMUNITY): Payer: Self-pay | Admitting: Physician Assistant

## 2020-05-29 VITALS — BP 160/90 | HR 57 | Ht 62.0 in | Wt 141.8 lb

## 2020-05-29 DIAGNOSIS — K219 Gastro-esophageal reflux disease without esophagitis: Secondary | ICD-10-CM | POA: Insufficient documentation

## 2020-05-29 DIAGNOSIS — Z7901 Long term (current) use of anticoagulants: Secondary | ICD-10-CM | POA: Diagnosis not present

## 2020-05-29 DIAGNOSIS — I10 Essential (primary) hypertension: Secondary | ICD-10-CM | POA: Diagnosis not present

## 2020-05-29 DIAGNOSIS — J449 Chronic obstructive pulmonary disease, unspecified: Secondary | ICD-10-CM | POA: Diagnosis not present

## 2020-05-29 DIAGNOSIS — D6869 Other thrombophilia: Secondary | ICD-10-CM | POA: Diagnosis not present

## 2020-05-29 DIAGNOSIS — Z8673 Personal history of transient ischemic attack (TIA), and cerebral infarction without residual deficits: Secondary | ICD-10-CM | POA: Diagnosis not present

## 2020-05-29 DIAGNOSIS — I4819 Other persistent atrial fibrillation: Secondary | ICD-10-CM | POA: Insufficient documentation

## 2020-05-29 DIAGNOSIS — Z79899 Other long term (current) drug therapy: Secondary | ICD-10-CM | POA: Insufficient documentation

## 2020-05-29 NOTE — Progress Notes (Signed)
Primary Care Physician: Si Gaul, DO Primary Electrophysiologist: Dr Elberta Fortis  Referring Physician: Dr Elberta Fortis   Vicki Swanson is a 75 y.o. female with a history of HTN, COPD, GERD, CVA, and atrial fibrillation who presents for follow up in the [facility]. The patient was admitted on 04/11/2020 with weakness, fatigue, confusion and dehydration. Flu +. She developed transient R facial droop, slurred speech, and aphasia in the hospital. An ILR was placed to monitor for afib. The patient was initially diagnosed with atrial fibrillation 04/29/20 on ILR. Patient was started on Eliquis for a CHADS2VASC score of 6. The device clinic received an alert for ongoing afib with RVR.   On follow up today, patient is s/p DCCV on 05/22/20. She reports that after her procedure "everything feels better." She denies any bleeding issues on anticoagulation.   Today, she denies symptoms of palpitations, chest pain, shortness of breath, orthopnea, PND, lower extremity edema, dizziness, presyncope, syncope, snoring, daytime somnolence, bleeding, or neurologic sequela. The patient is tolerating medications without difficulties and is otherwise without complaint today.    Atrial Fibrillation Risk Factors:  she does not have symptoms or diagnosis of sleep apnea. she does not have a history of rheumatic fever.   she has a BMI of Body mass index is 25.94 kg/m.Marland Kitchen Filed Weights   05/29/20 1442  Weight: 64.3 kg    No family history on file.   Atrial Fibrillation Management history:  Previous antiarrhythmic drugs: none Previous cardioversions: 05/22/20 Previous ablations: none CHADS2VASC score: 6 Anticoagulation history: Eliquis   Past Medical History:  Diagnosis Date  . COPD (chronic obstructive pulmonary disease) (HCC)   . GERD (gastroesophageal reflux disease)   . Hypertension    Past Surgical History:  Procedure Laterality Date  . ABDOMINAL HYSTERECTOMY    .  BREAST SURGERY    . CARDIOVERSION N/A 05/22/2020   Procedure: CARDIOVERSION;  Surgeon: Jake Bathe, MD;  Location: [facility] ENDOSCOPY;  Service: Cardiovascular;  Laterality: N/A;  . CHOLECYSTECTOMY    . LOOP RECORDER INSERTION N/A 04/16/2020   Procedure: LOOP RECORDER INSERTION;  Surgeon: Regan Lemming, MD;  Location: [facility] INVASIVE CV LAB;  Service: Cardiovascular;  Laterality: N/A;  . TONSILLECTOMY      Current Outpatient Medications  Medication Sig Dispense Refill  . apixaban (ELIQUIS) 2.5 MG TABS tablet Take 1 tablet (2.5 mg total) by mouth 2 (two) times daily. Patient has card for free 30 day supply. 60 tablet 3  . ascorbic acid (VITAMIN C) 500 MG tablet Take 500 mg by mouth daily.    Marland Kitchen atorvastatin (LIPITOR) 40 MG tablet Take 1 tablet (40 mg total) by mouth at bedtime. 30 tablet 0  . B Complex Vitamins (VITAMIN B COMPLEX) TABS Take 1 tablet by mouth daily.    Marland Kitchen gabapentin (NEURONTIN) 100 MG capsule Take 1 capsule (100 mg total) by mouth 3 (three) times daily. 60 capsule 0  . lisinopril (ZESTRIL) 20 MG tablet Take 10 mg by mouth daily.    . metoprolol tartrate (LOPRESSOR) 50 MG tablet Take 1 tablet (50 mg total) by mouth 2 (two) times daily. 60 tablet 3  . pantoprazole (PROTONIX) 40 MG tablet Take 40 mg by mouth daily before breakfast.    . potassium chloride SA (KLOR-CON) 20 MEQ tablet Take 20 mEq by mouth every evening.    . sertraline (ZOLOFT) 100 MG tablet Take 100 mg by mouth daily.    Marland Kitchen topiramate (TOPAMAX) 25 MG tablet Take 50 mg  by mouth 2 (two) times daily.     No current facility-administered medications for this encounter.    No Known Allergies  Social History   Socioeconomic History  . Marital status: Married    Spouse name: Not on file  . Number of children: Not on file  . Years of education: Not on file  . Highest education level: Not on file  Occupational History  . Not on file  Tobacco Use  . Smoking status: Never Smoker  . Smokeless tobacco: Never Used   Substance and Sexual Activity  . Alcohol use: Never  . Drug use: Never  . Sexual activity: Not on file  Other Topics Concern  . Not on file  Social History Narrative  . Not on file   Social Determinants of Health   Financial Resource Strain: Not on file  Food Insecurity: Not on file  Transportation Needs: Not on file  Physical Activity: Not on file  Stress: Not on file  Social Connections: Not on file  Intimate Partner Violence: Not on file     ROS- All systems are reviewed and negative except as per the HPI above.  Physical Exam: Vitals:   05/29/20 1442  BP: (!) 160/90  Pulse: (!) 57  Weight: 64.3 kg  Height: 5\' 2"  (1.575 m)    GEN- The patient is well appearing elderly female, alert and oriented x 3 today.   HEENT-head normocephalic, atraumatic, sclera clear, conjunctiva pink, hearing intact, trachea midline. Lungs- Clear to ausculation bilaterally, normal work of breathing Heart- Regular rate and rhythm, no murmurs, rubs or gallops  GI- soft, NT, ND, + BS Extremities- no clubbing, cyanosis, or edema MS- no significant deformity or atrophy Skin- no rash or lesion Psych- euthymic mood, full affect Neuro- strength and sensation are intact   Wt Readings from Last 3 Encounters:  05/29/20 64.3 kg  05/22/20 63.5 kg  05/15/20 63.8 kg    EKG today demonstrates  SB Vent. rate 57 BPM PR interval 154 ms QRS duration 66 ms QT/QTc 446/434 ms   Echo 04/14/20 demonstrated  1. Left ventricular ejection fraction, by estimation, is 65 to 70%. The  left ventricle has normal function. The left ventricle has no regional  wall motion abnormalities. There is mild left ventricular hypertrophy.  Left ventricular diastolic parameters  are consistent with Grade II diastolic dysfunction (pseudonormalization).  2. Right ventricular systolic function is normal. The right ventricular  size is normal. Tricuspid regurgitation signal is inadequate for assessing  PA pressure.   3. Left atrial size was mildly dilated.  4. Right atrial size was mildly dilated.  5. The mitral valve is normal in structure. Trivial mitral valve  regurgitation. No evidence of mitral stenosis.  6. The aortic valve is tricuspid. Aortic valve regurgitation is not  visualized. Mild aortic valve sclerosis is present, with no evidence of  aortic valve stenosis.  7. The inferior vena cava is normal in size with greater than 50%  respiratory variability, suggesting right atrial pressure of 3 mmHg.   Epic records are reviewed at length today  CHA2DS2-VASc Score = 6  The patient's score is based upon: CHF History: No HTN History: Yes Diabetes History: No Stroke History: Yes Vascular Disease History: No Age Score: 2 Gender Score: 1      ASSESSMENT AND PLAN: 1. Persistent Atrial Fibrillation The patient's CHA2DS2-VASc score is 6, indicating a 9.7% annual risk of stroke.  S/p DCCV on 05/22/20 Continue Eliquis 5 mg BID Continue metoprolol 50 mg  BID ILR for monitoring.   2. Secondary Hypercoagulable State (ICD10:  D68.69) The patient is at significant risk for stroke/thromboembolism based upon her CHA2DS2-VASc Score of 6.  Continue Apixaban (Eliquis).   3. HTN Elevated today, lisinopril held previously to accommodate rate control. Resume lisinopril 10 mg daily today.   Follow up in the AF clinic in 3 months.    Jorja Loa PA-C Afib Clinic [facility] [address] [place], [place] [address] [phone] 05/29/2020 4:23 PM

## 2020-05-29 NOTE — Patient Instructions (Signed)
Restart Lisinopril 10mg  once a day

## 2020-05-31 NOTE — Progress Notes (Signed)
Carelink Summary Report / Loop Recorder 

## 2020-07-22 ENCOUNTER — Encounter (HOSPITAL_COMMUNITY): Payer: Self-pay

## 2020-07-22 ENCOUNTER — Emergency Department (HOSPITAL_COMMUNITY)
Admission: EM | Admit: 2020-07-22 | Discharge: 2020-07-22 | Disposition: A | Discharge status: Home / Self Care | Payer: Medicare Other | Attending: Emergency Medicine | Admitting: Emergency Medicine

## 2020-07-22 DIAGNOSIS — R799 Abnormal finding of blood chemistry, unspecified: Secondary | ICD-10-CM | POA: Insufficient documentation

## 2020-07-22 DIAGNOSIS — R339 Retention of urine, unspecified: Secondary | ICD-10-CM | POA: Insufficient documentation

## 2020-07-22 DIAGNOSIS — Z5321 Procedure and treatment not carried out due to patient leaving prior to being seen by health care provider: Secondary | ICD-10-CM | POA: Insufficient documentation

## 2020-07-22 LAB — CBC WITH DIFFERENTIAL/PLATELET
Abs Immature Granulocytes: 0.03 10*3/uL (ref 0.00–0.07)
Basophils Absolute: 0 10*3/uL (ref 0.0–0.1)
Basophils Relative: 0 %
Eosinophils Absolute: 0.1 10*3/uL (ref 0.0–0.5)
Eosinophils Relative: 1 %
HCT: 41.5 % (ref 36.0–46.0)
Hemoglobin: 13.9 g/dL (ref 12.0–15.0)
Immature Granulocytes: 0 %
Lymphocytes Relative: 34 %
Lymphs Abs: 2.6 10*3/uL (ref 0.7–4.0)
MCH: 30.9 pg (ref 26.0–34.0)
MCHC: 33.5 g/dL (ref 30.0–36.0)
MCV: 92.2 fL (ref 80.0–100.0)
Monocytes Absolute: 0.5 10*3/uL (ref 0.1–1.0)
Monocytes Relative: 6 %
Neutro Abs: 4.5 10*3/uL (ref 1.7–7.7)
Neutrophils Relative %: 59 %
Platelets: 225 10*3/uL (ref 150–400)
RBC: 4.5 MIL/uL (ref 3.87–5.11)
RDW: 13.7 % (ref 11.5–15.5)
WBC: 7.7 10*3/uL (ref 4.0–10.5)
nRBC: 0 % (ref 0.0–0.2)

## 2020-07-22 LAB — URINALYSIS, ROUTINE W REFLEX MICROSCOPIC
Bilirubin Urine: NEGATIVE
Glucose, UA: NEGATIVE mg/dL
Ketones, ur: NEGATIVE mg/dL
Nitrite: POSITIVE — AB
Protein, ur: NEGATIVE mg/dL
Specific Gravity, Urine: 1.014 (ref 1.005–1.030)
WBC, UA: >50 WBC/hpf — ABNORMAL HIGH (ref 0–5)
pH: 5 (ref 5.0–8.0)

## 2020-07-22 LAB — COMPREHENSIVE METABOLIC PANEL
ALT: 17 U/L (ref 0–44)
AST: 19 U/L (ref 15–41)
Albumin: 4 g/dL (ref 3.5–5.0)
Alkaline Phosphatase: 92 U/L (ref 38–126)
Anion gap: 7 (ref 5–15)
BUN: 35 mg/dL — ABNORMAL HIGH (ref 8–23)
CO2: 18 mmol/L — ABNORMAL LOW (ref 22–32)
Calcium: 9.4 mg/dL (ref 8.9–10.3)
Chloride: 113 mmol/L — ABNORMAL HIGH (ref 98–111)
Creatinine, Ser: 1.57 mg/dL — ABNORMAL HIGH (ref 0.44–1.00)
GFR, Estimated: 34 mL/min — ABNORMAL LOW (ref >60)
Glucose, Bld: 88 mg/dL (ref 70–99)
Potassium: 5 mmol/L (ref 3.5–5.1)
Sodium: 138 mmol/L (ref 135–145)
Total Bilirubin: 0.7 mg/dL (ref 0.3–1.2)
Total Protein: 7.4 g/dL (ref 6.5–8.1)

## 2020-07-22 LAB — TROPONIN I (HIGH SENSITIVITY): Troponin I (High Sensitivity): 8 ng/L (ref <18)

## 2020-07-22 NOTE — ED Triage Notes (Signed)
Pt reports went to PCP and had blood work done and PCP  Told pt to come to ED for abnormal  Labs. Pt states  Doesn't know which lab was abnormal and said PCP spoke to her husband. Pt also express concerns with her bladder states she's incontinent and cannot go when she wants to go.

## 2020-07-22 NOTE — ED Notes (Signed)
Pt notified staff that she was leaving 

## 2020-08-27 ENCOUNTER — Telehealth: Payer: Self-pay | Admitting: Emergency Medicine

## 2020-08-27 ENCOUNTER — Encounter (HOSPITAL_COMMUNITY): Payer: Self-pay | Admitting: Physician Assistant

## 2020-08-27 ENCOUNTER — Other Ambulatory Visit: Payer: Self-pay

## 2020-08-27 ENCOUNTER — Ambulatory Visit (HOSPITAL_COMMUNITY)
Admission: RE | Admit: 2020-08-27 | Discharge: 2020-08-27 | Disposition: A | Discharge status: Home / Self Care | Payer: Medicare Other | Source: Ambulatory Visit | Attending: Physician Assistant | Admitting: Physician Assistant

## 2020-08-27 VITALS — BP 150/80 | HR 50 | Ht 62.0 in | Wt 136.6 lb

## 2020-08-27 DIAGNOSIS — D6869 Other thrombophilia: Secondary | ICD-10-CM | POA: Insufficient documentation

## 2020-08-27 DIAGNOSIS — I10 Essential (primary) hypertension: Secondary | ICD-10-CM | POA: Insufficient documentation

## 2020-08-27 DIAGNOSIS — I4819 Other persistent atrial fibrillation: Secondary | ICD-10-CM | POA: Insufficient documentation

## 2020-08-27 DIAGNOSIS — Z7901 Long term (current) use of anticoagulants: Secondary | ICD-10-CM | POA: Diagnosis not present

## 2020-08-27 DIAGNOSIS — G4733 Obstructive sleep apnea (adult) (pediatric): Secondary | ICD-10-CM | POA: Insufficient documentation

## 2020-08-27 NOTE — Progress Notes (Signed)
Primary Care Physician: Si Gaul, DO Primary Electrophysiologist: Dr Elberta Fortis  Referring Physician: Dr Elberta Fortis   Vicki Swanson is a 75 y.o. female with a history of HTN, COPD, GERD, CVA, OSA, and atrial fibrillation who presents for follow up in the [facility]. The patient was admitted on 04/11/2020 with weakness, fatigue, confusion and dehydration. Flu +. She developed transient R facial droop, slurred speech, and aphasia in the hospital. An ILR was placed to monitor for afib. The patient was initially diagnosed with atrial fibrillation 04/29/20 on ILR. Patient was started on Eliquis for a CHADS2VASC score of 6. The device clinic received an alert for ongoing afib with RVR.   On follow up today, patient reports she has been very drowsy during the day. She has a diagnosis of OSA and admits she has not been compliant with CPAP therapy. She denies any heart racing or palpitations. She denies any bleeding issues on anticoagulation.   Today, she denies symptoms of palpitations, chest pain, shortness of breath, orthopnea, PND, lower extremity edema, dizziness, presyncope, syncope, snoring, daytime somnolence, bleeding, or neurologic sequela. The patient is tolerating medications without difficulties and is otherwise without complaint today.    Atrial Fibrillation Risk Factors:  she does have symptoms or diagnosis of sleep apnea. She is not compliant with CPAP therapy.  she does not have a history of rheumatic fever.   she has a BMI of Body mass index is 24.98 kg/m.Marland Kitchen Filed Weights   08/27/20 1451  Weight: 62 kg    No family history on file.   Atrial Fibrillation Management history:  Previous antiarrhythmic drugs: none Previous cardioversions: 05/22/20 Previous ablations: none CHADS2VASC score: 6 Anticoagulation history: Eliquis   Past Medical History:  Diagnosis Date  . COPD (chronic obstructive pulmonary disease) (HCC)   . GERD  (gastroesophageal reflux disease)   . Hypertension    Past Surgical History:  Procedure Laterality Date  . ABDOMINAL HYSTERECTOMY    . BREAST SURGERY    . CARDIOVERSION N/A 05/22/2020   Procedure: CARDIOVERSION;  Surgeon: Jake Bathe, MD;  Location: [facility] ENDOSCOPY;  Service: Cardiovascular;  Laterality: N/A;  . CHOLECYSTECTOMY    . LOOP RECORDER INSERTION N/A 04/16/2020   Procedure: LOOP RECORDER INSERTION;  Surgeon: Regan Lemming, MD;  Location: [facility] INVASIVE CV LAB;  Service: Cardiovascular;  Laterality: N/A;  . TONSILLECTOMY      Current Outpatient Medications  Medication Sig Dispense Refill  . apixaban (ELIQUIS) 2.5 MG TABS tablet Take 1 tablet (2.5 mg total) by mouth 2 (two) times daily. Patient has card for free 30 day supply. 60 tablet 3  . ascorbic acid (VITAMIN C) 500 MG tablet Take 500 mg by mouth daily.    Marland Kitchen atorvastatin (LIPITOR) 40 MG tablet Take 1 tablet (40 mg total) by mouth at bedtime. 30 tablet 0  . B Complex Vitamins (VITAMIN B COMPLEX) TABS Take 1 tablet by mouth daily.    Marland Kitchen gabapentin (NEURONTIN) 100 MG capsule Take 1 capsule (100 mg total) by mouth 3 (three) times daily. 60 capsule 0  . lisinopril (ZESTRIL) 20 MG tablet Take 10 mg by mouth daily.    . metoprolol tartrate (LOPRESSOR) 50 MG tablet Take 1 tablet (50 mg total) by mouth 2 (two) times daily. 60 tablet 3  . pantoprazole (PROTONIX) 40 MG tablet Take 40 mg by mouth daily before breakfast.    . sertraline (ZOLOFT) 100 MG tablet Take 100 mg by mouth daily.    Marland Kitchen  topiramate (TOPAMAX) 25 MG tablet Take 50 mg by mouth 2 (two) times daily.     No current facility-administered medications for this encounter.    No Known Allergies  Social History   Socioeconomic History  . Marital status: Married    Spouse name: Not on file  . Number of children: Not on file  . Years of education: Not on file  . Highest education level: Not on file  Occupational History  . Not on file  Tobacco Use  . Smoking  status: Never Smoker  . Smokeless tobacco: Never Used  Substance and Sexual Activity  . Alcohol use: Never  . Drug use: Never  . Sexual activity: Not on file  Other Topics Concern  . Not on file  Social History Narrative  . Not on file   Social Determinants of Health   Financial Resource Strain: Not on file  Food Insecurity: Not on file  Transportation Needs: Not on file  Physical Activity: Not on file  Stress: Not on file  Social Connections: Not on file  Intimate Partner Violence: Not on file     ROS- All systems are reviewed and negative except as per the HPI above.  Physical Exam: Vitals:   08/27/20 1451  BP: (!) 150/80  Pulse: (!) 50  Weight: 62 kg  Height: 5\' 2"  (1.575 m)    GEN- The patient is a well appearing elderly female, alert and oriented x 3 today.   HEENT-head normocephalic, atraumatic, sclera clear, conjunctiva pink, hearing intact, trachea midline. Lungs- Clear to ausculation bilaterally, normal work of breathing Heart- Regular rate and rhythm, bradycardia, no murmurs, rubs or gallops  GI- soft, NT, ND, + BS Extremities- no clubbing, cyanosis, or edema MS- no significant deformity or atrophy Skin- no rash or lesion Psych- euthymic mood, full affect Neuro- strength and sensation are intact   Wt Readings from Last 3 Encounters:  08/27/20 62 kg  07/22/20 59 kg  05/29/20 64.3 kg    EKG today demonstrates  SB Vent. rate 50 BPM PR interval 154 ms QRS duration 72 ms QT/QTcB 462/421 ms   Echo 04/14/20 demonstrated  1. Left ventricular ejection fraction, by estimation, is 65 to 70%. The  left ventricle has normal function. The left ventricle has no regional  wall motion abnormalities. There is mild left ventricular hypertrophy.  Left ventricular diastolic parameters  are consistent with Grade II diastolic dysfunction (pseudonormalization).  2. Right ventricular systolic function is normal. The right ventricular  size is normal. Tricuspid  regurgitation signal is inadequate for assessing  PA pressure.  3. Left atrial size was mildly dilated.  4. Right atrial size was mildly dilated.  5. The mitral valve is normal in structure. Trivial mitral valve  regurgitation. No evidence of mitral stenosis.  6. The aortic valve is tricuspid. Aortic valve regurgitation is not  visualized. Mild aortic valve sclerosis is present, with no evidence of  aortic valve stenosis.  7. The inferior vena cava is normal in size with greater than 50%  respiratory variability, suggesting right atrial pressure of 3 mmHg.   Epic records are reviewed at length today  CHA2DS2-VASc Score = 6  The patient's score is based upon: CHF History: No HTN History: Yes Diabetes History: No Stroke History: Yes Vascular Disease History: No Age Score: 2 Gender Score: 1      ASSESSMENT AND PLAN: 1. Persistent Atrial Fibrillation The patient's CHA2DS2-VASc score is 6, indicating a 9.7% annual risk of stroke.  Patient appears to  be maintaining SR. Continue Eliquis 5 mg BID Continue metoprolol 50 mg BID ILR for monitoring.   2. Secondary Hypercoagulable State (ICD10:  D68.69) The patient is at significant risk for stroke/thromboembolism based upon her CHA2DS2-VASc Score of 6.  Continue Apixaban (Eliquis).   3. HTN Stable, no changes today.  4. OSA/excessive daytime sleepiness  The importance of adequate treatment of sleep apnea was discussed today in order to improve our ability to maintain sinus rhythm long term. Patient agreeable to resuming CPAP.   Follow up in the AF clinic in 6 months.    Jorja Loa PA-C Afib Clinic [facility] [address] [place], [place] [address] [phone] 08/27/2020 2:56 PM

## 2020-08-27 NOTE — Telephone Encounter (Signed)
Remote transmissions not coming through from home monitor from Linq II. Patient reports monitor is plugged in and the green light comes on during the day time. She reports there is a lamp between the monitor and where she sleeps. Patient advised to move monitor in front of lamp and we will check the Carelink site to ensure a transmission is received 08/28/20.

## 2020-08-28 NOTE — Telephone Encounter (Signed)
LMOM to call device clinic per DPR. Device clinic # and office hours provided. NO transmissions are being received from [facility].

## 2020-09-01 NOTE — Telephone Encounter (Signed)
Spoke with patient. She has moved everything but a lamp off of her nightstand.  We are still not getting data transmitted.    Conferenced in [employer] tech support.  The rep noted that the last report received from device indicates that the date stamp is 05/03/1992 and therefore will not send reports through since they are dated before the implant date.  Rep, Elisa transferred [place] to a higher level tech support.  We spoke very briefly with Mellody Dance.  After identifying himself, while he was talking to the patient his line went dead.   Advised patient my hope is that Mellody Dance will call her back since her number is correct on the system.  We ended the call to allow for the [employer] rep to call through.    After a few minutes, I called [employer] back and was able to speak with Mellody Dance again.    It appears ILR had a full electronic reset on about 1/20.  In reviewing the file.  She did have a DCCV on this date, which would account for the electrical reset.    Patient needs to come into the office for preprogramming of the device.  The date/ time of device needs to adjusted and all recording parameters reset.    Attempted to call patient back to schedule her in device clinic.  No answer after multiple attempts.  Her voicemail is full so I am unable to leave a message.

## 2020-09-02 NOTE — Telephone Encounter (Signed)
Patient contacted and scheduled for device clinic appointment to reset date and time in LINQ II with IPAD to allow remote transmissions. DCCV 05/22/20 caused electrical reset.

## 2020-09-02 NOTE — Telephone Encounter (Signed)
No answer. Mail box full. Need to schedule for appointment in [place] to rest clock in LINQ II.

## 2020-09-02 NOTE — Telephone Encounter (Signed)
Attempted to call patient and son listed on DPR. Mail box full and the other mailbox has not been set up. Needs device clinic appointment to reset time in loop recorder.

## 2020-09-09 ENCOUNTER — Encounter (INDEPENDENT_AMBULATORY_CARE_PROVIDER_SITE_OTHER): Payer: Self-pay

## 2020-09-09 ENCOUNTER — Other Ambulatory Visit: Payer: Self-pay

## 2020-09-09 ENCOUNTER — Ambulatory Visit (INDEPENDENT_AMBULATORY_CARE_PROVIDER_SITE_OTHER): Payer: Medicare Other | Admitting: Emergency Medicine

## 2020-09-09 DIAGNOSIS — I4891 Unspecified atrial fibrillation: Secondary | ICD-10-CM

## 2020-09-09 LAB — CUP PACEART INCLINIC DEVICE CHECK
Date Time Interrogation Session: 20220510151928
Implantable Pulse Generator Implant Date: 20211215

## 2020-09-09 NOTE — Progress Notes (Signed)
ILR check in clinic. Device required data reset d/t recent ablation per Noreene Larsson, [employer]. Device demographics and parameters programmed the same as prior.

## 2020-10-13 ENCOUNTER — Ambulatory Visit (INDEPENDENT_AMBULATORY_CARE_PROVIDER_SITE_OTHER): Payer: Medicare Other

## 2020-10-13 DIAGNOSIS — I4891 Unspecified atrial fibrillation: Secondary | ICD-10-CM | POA: Diagnosis not present

## 2020-10-14 LAB — CUP PACEART REMOTE DEVICE CHECK
Date Time Interrogation Session: 20220612205248
Implantable Pulse Generator Implant Date: 20211215

## 2020-10-31 NOTE — Progress Notes (Signed)
Carelink Summary Report / Loop Recorder 

## 2020-11-13 ENCOUNTER — Ambulatory Visit (INDEPENDENT_AMBULATORY_CARE_PROVIDER_SITE_OTHER): Payer: Medicare Other

## 2020-11-13 DIAGNOSIS — I4891 Unspecified atrial fibrillation: Secondary | ICD-10-CM

## 2020-11-18 LAB — CUP PACEART REMOTE DEVICE CHECK
Date Time Interrogation Session: 20220719061358
Implantable Pulse Generator Implant Date: 20211215

## 2020-12-06 NOTE — Progress Notes (Signed)
Carelink Summary Report / Loop Recorder 

## 2020-12-15 ENCOUNTER — Ambulatory Visit (INDEPENDENT_AMBULATORY_CARE_PROVIDER_SITE_OTHER): Payer: Medicare Other

## 2020-12-15 DIAGNOSIS — I639 Cerebral infarction, unspecified: Secondary | ICD-10-CM | POA: Diagnosis not present

## 2020-12-18 LAB — CUP PACEART REMOTE DEVICE CHECK
Date Time Interrogation Session: 20220818063014
Implantable Pulse Generator Implant Date: 20211215

## 2020-12-22 ENCOUNTER — Telehealth: Payer: Self-pay

## 2020-12-22 NOTE — Telephone Encounter (Signed)
"  LINQ alert received. 7 new AF episodes, all from 8/21. Longest duration 2hrs, , rates 100-143. Pt. prescribed Eliquis, metoprolol. Route to triage for RVR"  Patient reports she has felt "off for a while" (although feeling well today.) States at time she has chest pain but unable to tell where the pain is exactly located. Denies palpitations or any symptoms this am.  Reports compliance with medications on file including Lopressor 50 mg BID and  Eliquis 2.5 mg BID.  Advised I forward this to AF clinic and she will receive a follow up call if any changes are warranted. Patient verbalized understanding and appreciative of call.

## 2020-12-24 NOTE — Telephone Encounter (Signed)
Per LINQ transmission pt continues in AF per [facility] PA will bring in for assessment.

## 2020-12-24 NOTE — Telephone Encounter (Signed)
Called patient to schedule appt and VM is full, so unable to leave a message.  Will call pt back.

## 2020-12-25 NOTE — Telephone Encounter (Signed)
Called and tried to reach patient again.  Phone went straight to VM, mailbox still full.  Tried to called the EC on file, Harvey-spouse.  No answer and says his VM has not been set up. Will call patient back.

## 2020-12-29 ENCOUNTER — Encounter (HOSPITAL_COMMUNITY): Payer: Self-pay | Admitting: Physician Assistant

## 2020-12-29 ENCOUNTER — Other Ambulatory Visit: Payer: Self-pay

## 2020-12-29 ENCOUNTER — Ambulatory Visit (HOSPITAL_COMMUNITY)
Admission: RE | Admit: 2020-12-29 | Discharge: 2020-12-29 | Disposition: A | Discharge status: Home / Self Care | Payer: Medicare Other | Source: Ambulatory Visit | Attending: Physician Assistant | Admitting: Physician Assistant

## 2020-12-29 VITALS — BP 122/70 | HR 101 | Ht 62.0 in | Wt 138.0 lb

## 2020-12-29 DIAGNOSIS — D6869 Other thrombophilia: Secondary | ICD-10-CM | POA: Diagnosis not present

## 2020-12-29 DIAGNOSIS — R0789 Other chest pain: Secondary | ICD-10-CM | POA: Diagnosis not present

## 2020-12-29 DIAGNOSIS — I10 Essential (primary) hypertension: Secondary | ICD-10-CM | POA: Insufficient documentation

## 2020-12-29 DIAGNOSIS — I4891 Unspecified atrial fibrillation: Secondary | ICD-10-CM | POA: Diagnosis not present

## 2020-12-29 DIAGNOSIS — Z7901 Long term (current) use of anticoagulants: Secondary | ICD-10-CM | POA: Insufficient documentation

## 2020-12-29 DIAGNOSIS — Z79899 Other long term (current) drug therapy: Secondary | ICD-10-CM | POA: Diagnosis not present

## 2020-12-29 DIAGNOSIS — G4733 Obstructive sleep apnea (adult) (pediatric): Secondary | ICD-10-CM | POA: Diagnosis not present

## 2020-12-29 DIAGNOSIS — I4819 Other persistent atrial fibrillation: Secondary | ICD-10-CM | POA: Diagnosis present

## 2020-12-29 LAB — CBC
HCT: 37.8 % (ref 36.0–46.0)
Hemoglobin: 12.3 g/dL (ref 12.0–15.0)
MCH: 30.8 pg (ref 26.0–34.0)
MCHC: 32.5 g/dL (ref 30.0–36.0)
MCV: 94.7 fL (ref 80.0–100.0)
Platelets: 161 10*3/uL (ref 150–400)
RBC: 3.99 MIL/uL (ref 3.87–5.11)
RDW: 13.7 % (ref 11.5–15.5)
WBC: 7.3 10*3/uL (ref 4.0–10.5)
nRBC: 0 % (ref 0.0–0.2)

## 2020-12-29 LAB — BASIC METABOLIC PANEL
Anion gap: 3 — ABNORMAL LOW (ref 5–15)
BUN: 17 mg/dL (ref 8–23)
CO2: 24 mmol/L (ref 22–32)
Calcium: 8.5 mg/dL — ABNORMAL LOW (ref 8.9–10.3)
Chloride: 115 mmol/L — ABNORMAL HIGH (ref 98–111)
Creatinine, Ser: 1.36 mg/dL — ABNORMAL HIGH (ref 0.44–1.00)
GFR, Estimated: 40 mL/min — ABNORMAL LOW (ref >60)
Glucose, Bld: 98 mg/dL (ref 70–99)
Potassium: 4 mmol/L (ref 3.5–5.1)
Sodium: 142 mmol/L (ref 135–145)

## 2020-12-29 NOTE — H&P (View-Only) (Signed)
Primary Care Physician: Si Gaul, DO Primary Electrophysiologist: Dr Elberta Fortis  Referring Physician: Dr Elberta Fortis   Vicki Swanson is a 75 y.o. female with a history of HTN, COPD, GERD, CVA, OSA, and atrial fibrillation who presents for follow up in the [facility]. The patient was admitted on 04/11/2020 with weakness, fatigue, confusion and dehydration. Flu +. She developed transient R facial droop, slurred speech, and aphasia in the hospital. An ILR was placed to monitor for afib. The patient was initially diagnosed with atrial fibrillation 04/29/20 on ILR. Patient was started on Eliquis for a CHADS2VASC score of 6. The device clinic received an alert for ongoing afib with RVR and she underwent DCCV on 05/22/20.  On follow up today, patient reports that over the past week she has had increased fatigue which correlates with the onset of her afib. She admits she has still not resumed her CPAP therapy. She denies any bleeding issues on anticoagulation. She also reports chest pain which feels like a "tightness" going across her chest and radiates to the center of her back. This has been ongoing for several months. The pain is worse with sitting or laying down.   Today, she denies symptoms of palpitations, orthopnea, PND, lower extremity edema, dizziness, presyncope, syncope, snoring, daytime somnolence, bleeding, or neurologic sequela. The patient is tolerating medications without difficulties and is otherwise without complaint today.    Atrial Fibrillation Risk Factors:  she does have symptoms or diagnosis of sleep apnea. She is not compliant with CPAP therapy.  she does not have a history of rheumatic fever.   she has a BMI of Body mass index is 25.24 kg/m.Marland Kitchen Filed Weights   12/29/20 1403  Weight: 62.6 kg     No family history on file.   Atrial Fibrillation Management history:  Previous antiarrhythmic drugs: none Previous cardioversions:  05/22/20 Previous ablations: none CHADS2VASC score: 6 Anticoagulation history: Eliquis   Past Medical History:  Diagnosis Date   COPD (chronic obstructive pulmonary disease) (HCC)    GERD (gastroesophageal reflux disease)    Hypertension    Past Surgical History:  Procedure Laterality Date   ABDOMINAL HYSTERECTOMY     BREAST SURGERY     CARDIOVERSION N/A 05/22/2020   Procedure: CARDIOVERSION;  Surgeon: Jake Bathe, MD;  Location: [facility];  Service: Cardiovascular;  Laterality: N/A;   CHOLECYSTECTOMY     LOOP RECORDER INSERTION N/A 04/16/2020   Procedure: LOOP RECORDER INSERTION;  Surgeon: Regan Lemming, MD;  Location: [facility] CV LAB;  Service: Cardiovascular;  Laterality: N/A;   TONSILLECTOMY      Current Outpatient Medications  Medication Sig Dispense Refill   apixaban (ELIQUIS) 2.5 MG TABS tablet Take 1 tablet (2.5 mg total) by mouth 2 (two) times daily. Patient has card for free 30 day supply. 60 tablet 3   ascorbic acid (VITAMIN C) 500 MG tablet Take 500 mg by mouth daily.     atorvastatin (LIPITOR) 40 MG tablet Take 1 tablet (40 mg total) by mouth at bedtime. (Patient taking differently: Take 20 mg by mouth at bedtime.) 30 tablet 0   B Complex Vitamins (VITAMIN B COMPLEX) TABS Take 1 tablet by mouth daily.     gabapentin (NEURONTIN) 100 MG capsule Take 1 capsule (100 mg total) by mouth 3 (three) times daily. 60 capsule 0   lisinopril (ZESTRIL) 20 MG tablet Take 10 mg by mouth daily.     metoprolol tartrate (LOPRESSOR) 50 MG tablet Take 1 tablet (  50 mg total) by mouth 2 (two) times daily. 60 tablet 3   pantoprazole (PROTONIX) 40 MG tablet Take 40 mg by mouth daily before breakfast.     sertraline (ZOLOFT) 100 MG tablet Take 150 mg by mouth daily.     topiramate (TOPAMAX) 25 MG tablet Take 50 mg by mouth 2 (two) times daily.     No current facility-administered medications for this encounter.    No Known Allergies  Social History   Socioeconomic History    Marital status: Married    Spouse name: Not on file   Number of children: Not on file   Years of education: Not on file   Highest education level: Not on file  Occupational History   Not on file  Tobacco Use   Smoking status: Never   Smokeless tobacco: Never  Substance and Sexual Activity   Alcohol use: Never   Drug use: Never   Sexual activity: Not on file  Other Topics Concern   Not on file  Social History Narrative   Not on file   Social Determinants of Health   Financial Resource Strain: Not on file  Food Insecurity: Not on file  Transportation Needs: Not on file  Physical Activity: Not on file  Stress: Not on file  Social Connections: Not on file  Intimate Partner Violence: Not on file     ROS- All systems are reviewed and negative except as per the HPI above.  Physical Exam: Vitals:   12/29/20 1403  BP: 122/70  Pulse: (!) 101  Weight: 62.6 kg  Height: 5\' 2"  (1.575 m)    GEN- The patient is a well appearing elderly female, alert and oriented x 3 today.   HEENT-head normocephalic, atraumatic, sclera clear, conjunctiva pink, hearing intact, trachea midline. Lungs- Clear to ausculation bilaterally, normal work of breathing Heart- irregular rate and rhythm, no murmurs, rubs or gallops  GI- soft, NT, ND, + BS Extremities- no clubbing, cyanosis, or edema MS- no significant deformity or atrophy Skin- no rash or lesion Psych- euthymic mood, full affect Neuro- strength and sensation are intact   Wt Readings from Last 3 Encounters:  12/29/20 62.6 kg  08/27/20 62 kg  07/22/20 59 kg    EKG today demonstrates  Afib Vent. rate 101 BPM PR interval * ms QRS duration 66 ms QT/QTcB 334/433 ms   Echo 04/14/20 demonstrated  1. Left ventricular ejection fraction, by estimation, is 65 to 70%. The  left ventricle has normal function. The left ventricle has no regional  wall motion abnormalities. There is mild left ventricular hypertrophy.  Left ventricular  diastolic parameters are consistent with Grade II diastolic dysfunction (pseudonormalization).   2. Right ventricular systolic function is normal. The right ventricular  size is normal. Tricuspid regurgitation signal is inadequate for assessing PA pressure.   3. Left atrial size was mildly dilated.   4. Right atrial size was mildly dilated.   5. The mitral valve is normal in structure. Trivial mitral valve  regurgitation. No evidence of mitral stenosis.   6. The aortic valve is tricuspid. Aortic valve regurgitation is not  visualized. Mild aortic valve sclerosis is present, with no evidence of  aortic valve stenosis.   7. The inferior vena cava is normal in size with greater than 50%  respiratory variability, suggesting right atrial pressure of 3 mmHg.   Epic records are reviewed at length today  CHA2DS2-VASc Score = 6  The patient's score is based upon: CHF History: No HTN History:  Yes Diabetes History: No Stroke History: Yes Vascular Disease History: No Age Score: 2 Gender Score: 1     ASSESSMENT AND PLAN: 1. Persistent Atrial Fibrillation The patient's CHA2DS2-VASc score is 6, indicating a 9.7% annual risk of stroke.  Patient in afib since 8/21. Suspected related to untreated OSA. Will plan for DCCV. Check bmet/cbc Continue Eliquis 5 mg BID Continue metoprolol 50 mg BID  2. Secondary Hypercoagulable State (ICD10:  D68.69) The patient is at significant risk for stroke/thromboembolism based upon her CHA2DS2-VASc Score of 6.  Continue Apixaban (Eliquis).   3. HTN Stable, no changes today.  4. OSA Again stressed the importance of treating OSA. Patient states she "will try."  5. Chest pain Patient having chest discomfort with both typical and atypical features. Will order stress test to evaluate for ischemia.    Follow up in the AF clinic post DCCV and stress test.    Jorja Loa PA-C Afib Clinic [facility] [address] [place], [place]  [id number] [phone] 12/29/2020 2:56 PM

## 2020-12-29 NOTE — Progress Notes (Signed)
Primary Care Physician: Si Gaul, DO Primary Electrophysiologist: Dr Elberta Fortis  Referring Physician: Dr Elberta Fortis   Vicki Swanson is a 75 y.o. female with a history of HTN, COPD, GERD, CVA, OSA, and atrial fibrillation who presents for follow up in the [facility]. The patient was admitted on 04/11/2020 with weakness, fatigue, confusion and dehydration. Flu +. She developed transient R facial droop, slurred speech, and aphasia in the hospital. An ILR was placed to monitor for afib. The patient was initially diagnosed with atrial fibrillation 04/29/20 on ILR. Patient was started on Eliquis for a CHADS2VASC score of 6. The device clinic received an alert for ongoing afib with RVR and she underwent DCCV on 05/22/20.  On follow up today, patient reports that over the past week she has had increased fatigue which correlates with the onset of her afib. She admits she has still not resumed her CPAP therapy. She denies any bleeding issues on anticoagulation. She also reports chest pain which feels like a "tightness" going across her chest and radiates to the center of her back. This has been ongoing for several months. The pain is worse with sitting or laying down.   Today, she denies symptoms of palpitations, orthopnea, PND, lower extremity edema, dizziness, presyncope, syncope, snoring, daytime somnolence, bleeding, or neurologic sequela. The patient is tolerating medications without difficulties and is otherwise without complaint today.    Atrial Fibrillation Risk Factors:  she does have symptoms or diagnosis of sleep apnea. She is not compliant with CPAP therapy.  she does not have a history of rheumatic fever.   she has a BMI of Body mass index is 25.24 kg/m.Marland Kitchen Filed Weights   12/29/20 1403  Weight: 62.6 kg     No family history on file.   Atrial Fibrillation Management history:  Previous antiarrhythmic drugs: none Previous cardioversions:  05/22/20 Previous ablations: none CHADS2VASC score: 6 Anticoagulation history: Eliquis   Past Medical History:  Diagnosis Date   COPD (chronic obstructive pulmonary disease) (HCC)    GERD (gastroesophageal reflux disease)    Hypertension    Past Surgical History:  Procedure Laterality Date   ABDOMINAL HYSTERECTOMY     BREAST SURGERY     CARDIOVERSION N/A 05/22/2020   Procedure: CARDIOVERSION;  Surgeon: Jake Bathe, MD;  Location: [facility];  Service: Cardiovascular;  Laterality: N/A;   CHOLECYSTECTOMY     LOOP RECORDER INSERTION N/A 04/16/2020   Procedure: LOOP RECORDER INSERTION;  Surgeon: Regan Lemming, MD;  Location: [facility] CV [facility];  Service: Cardiovascular;  Laterality: N/A;   TONSILLECTOMY      Current Outpatient Medications  Medication Sig Dispense Refill   apixaban (ELIQUIS) 2.5 MG TABS tablet Take 1 tablet (2.5 mg total) by mouth 2 (two) times daily. Patient has card for free 30 day supply. 60 tablet 3   ascorbic acid (VITAMIN C) 500 MG tablet Take 500 mg by mouth daily.     atorvastatin (LIPITOR) 40 MG tablet Take 1 tablet (40 mg total) by mouth at bedtime. (Patient taking differently: Take 20 mg by mouth at bedtime.) 30 tablet 0   B Complex Vitamins (VITAMIN B COMPLEX) TABS Take 1 tablet by mouth daily.     gabapentin (NEURONTIN) 100 MG capsule Take 1 capsule (100 mg total) by mouth 3 (three) times daily. 60 capsule 0   lisinopril (ZESTRIL) 20 MG tablet Take 10 mg by mouth daily.     metoprolol tartrate (LOPRESSOR) 50 MG tablet Take 1 tablet (  50 mg total) by mouth 2 (two) times daily. 60 tablet 3   pantoprazole (PROTONIX) 40 MG tablet Take 40 mg by mouth daily before breakfast.     sertraline (ZOLOFT) 100 MG tablet Take 150 mg by mouth daily.     topiramate (TOPAMAX) 25 MG tablet Take 50 mg by mouth 2 (two) times daily.     No current facility-administered medications for this encounter.    No Known Allergies  Social History   Socioeconomic History    Marital status: Married    Spouse name: Not on file   Number of children: Not on file   Years of education: Not on file   Highest education level: Not on file  Occupational History   Not on file  Tobacco Use   Smoking status: Never   Smokeless tobacco: Never  Substance and Sexual Activity   Alcohol use: Never   Drug use: Never   Sexual activity: Not on file  Other Topics Concern   Not on file  Social History Narrative   Not on file   Social Determinants of Health   Financial Resource Strain: Not on file  Food Insecurity: Not on file  Transportation Needs: Not on file  Physical Activity: Not on file  Stress: Not on file  Social Connections: Not on file  Intimate Partner Violence: Not on file     ROS- All systems are reviewed and negative except as per the HPI above.  Physical Exam: Vitals:   12/29/20 1403  BP: 122/70  Pulse: (!) 101  Weight: 62.6 kg  Height: 5\' 2"  (1.575 m)    GEN- The patient is a well appearing elderly female, alert and oriented x 3 today.   HEENT-head normocephalic, atraumatic, sclera clear, conjunctiva pink, hearing intact, trachea midline. Lungs- Clear to ausculation bilaterally, normal work of breathing Heart- irregular rate and rhythm, no murmurs, rubs or gallops  GI- soft, NT, ND, + BS Extremities- no clubbing, cyanosis, or edema MS- no significant deformity or atrophy Skin- no rash or lesion Psych- euthymic mood, full affect Neuro- strength and sensation are intact   Wt Readings from Last 3 Encounters:  12/29/20 62.6 kg  08/27/20 62 kg  07/22/20 59 kg    EKG today demonstrates  Afib Vent. rate 101 BPM PR interval * ms QRS duration 66 ms QT/QTcB 334/433 ms   Echo 04/14/20 demonstrated  1. Left ventricular ejection fraction, by estimation, is 65 to 70%. The  left ventricle has normal function. The left ventricle has no regional  wall motion abnormalities. There is mild left ventricular hypertrophy.  Left ventricular  diastolic parameters are consistent with Grade II diastolic dysfunction (pseudonormalization).   2. Right ventricular systolic function is normal. The right ventricular  size is normal. Tricuspid regurgitation signal is inadequate for assessing PA pressure.   3. Left atrial size was mildly dilated.   4. Right atrial size was mildly dilated.   5. The mitral valve is normal in structure. Trivial mitral valve  regurgitation. No evidence of mitral stenosis.   6. The aortic valve is tricuspid. Aortic valve regurgitation is not  visualized. Mild aortic valve sclerosis is present, with no evidence of  aortic valve stenosis.   7. The inferior vena cava is normal in size with greater than 50%  respiratory variability, suggesting right atrial pressure of 3 mmHg.   Epic records are reviewed at length today  CHA2DS2-VASc Score = 6  The patient's score is based upon: CHF History: No HTN History:  Yes Diabetes History: No Stroke History: Yes Vascular Disease History: No Age Score: 2 Gender Score: 1     ASSESSMENT AND PLAN: 1. Persistent Atrial Fibrillation The patient's CHA2DS2-VASc score is 6, indicating a 9.7% annual risk of stroke.  Patient in afib since 8/21. Suspected related to untreated OSA. Will plan for DCCV. Check bmet/cbc Continue Eliquis 5 mg BID Continue metoprolol 50 mg BID  2. Secondary Hypercoagulable State (ICD10:  D68.69) The patient is at significant risk for stroke/thromboembolism based upon her CHA2DS2-VASc Score of 6.  Continue Apixaban (Eliquis).   3. HTN Stable, no changes today.  4. OSA Again stressed the importance of treating OSA. Patient states she "will try."  5. Chest pain Patient having chest discomfort with both typical and atypical features. Will order stress test to evaluate for ischemia.    Follow up in the AF clinic post DCCV and stress test.    Jorja Loa PA-C Afib Clinic [facility] [address] [place], [place]  [id number] [phone] 12/29/2020 2:56 PM

## 2020-12-29 NOTE — Patient Instructions (Signed)
Cardioversion scheduled for Tuesday, September 6th  - Arrive at the [facility] and go to admitting at 1130AM  - Do not eat or drink anything after midnight the night prior to your procedure.  - Take all your morning medication (except diabetic medications) with a sip of water prior to arrival.  - You will not be able to drive home after your procedure.  - Do NOT miss any doses of your blood thinner - if you should miss a dose please notify our office immediately.  - If you feel as if you go back into normal rhythm prior to scheduled cardioversion, please notify our office immediately. If your procedure is canceled in the cardioversion suite you will be charged a cancellation fee.  Patients will be asked to: to mask in public and hand hygiene (no longer quarantine) in the 3 days prior to surgery, to report if any COVID-19-like illness or household contacts to COVID-19 to determine need for testing       Scheduling will call regarding stress test appointment

## 2020-12-30 ENCOUNTER — Other Ambulatory Visit (HOSPITAL_COMMUNITY): Payer: Self-pay | Admitting: *Deleted

## 2020-12-30 ENCOUNTER — Other Ambulatory Visit (HOSPITAL_COMMUNITY): Payer: Self-pay | Admitting: Physician Assistant

## 2020-12-30 DIAGNOSIS — R079 Chest pain, unspecified: Secondary | ICD-10-CM

## 2020-12-31 ENCOUNTER — Telehealth (HOSPITAL_COMMUNITY): Payer: Self-pay | Admitting: *Deleted

## 2020-12-31 NOTE — Telephone Encounter (Signed)
Patient given detailed instructions per Myocardial Perfusion Study Information Sheet for the test on 01/07/21 at 1045. Patient notified to arrive 15 minutes early and that it is imperative to arrive on time for appointment to keep from having the test rescheduled.  If you need to cancel or reschedule your appointment, please call the office within 24 hours of your appointment. . Patient verbalized understanding.[name], Vicki Swanson No mychart available.

## 2021-01-02 NOTE — Progress Notes (Signed)
Carelink Summary Report / Loop Recorder 

## 2021-01-06 ENCOUNTER — Encounter (HOSPITAL_COMMUNITY): Payer: Self-pay | Admitting: Cardiology

## 2021-01-06 ENCOUNTER — Ambulatory Visit (HOSPITAL_COMMUNITY): Payer: Medicare Other | Admitting: Certified Registered Nurse Anesthetist

## 2021-01-06 ENCOUNTER — Other Ambulatory Visit: Payer: Self-pay

## 2021-01-06 ENCOUNTER — Encounter (HOSPITAL_COMMUNITY)
Admission: RE | Disposition: A | Discharge status: Home / Self Care | Payer: Self-pay | Source: Ambulatory Visit | Attending: Cardiology

## 2021-01-06 ENCOUNTER — Ambulatory Visit (HOSPITAL_COMMUNITY)
Admission: RE | Admit: 2021-01-06 | Discharge: 2021-01-06 | Disposition: A | Discharge status: Home / Self Care | Payer: Medicare Other | Source: Ambulatory Visit | Attending: Cardiology | Admitting: Cardiology

## 2021-01-06 DIAGNOSIS — R079 Chest pain, unspecified: Secondary | ICD-10-CM | POA: Diagnosis not present

## 2021-01-06 DIAGNOSIS — Z79899 Other long term (current) drug therapy: Secondary | ICD-10-CM | POA: Diagnosis not present

## 2021-01-06 DIAGNOSIS — Z7901 Long term (current) use of anticoagulants: Secondary | ICD-10-CM | POA: Insufficient documentation

## 2021-01-06 DIAGNOSIS — I4819 Other persistent atrial fibrillation: Secondary | ICD-10-CM | POA: Insufficient documentation

## 2021-01-06 DIAGNOSIS — D6869 Other thrombophilia: Secondary | ICD-10-CM | POA: Diagnosis not present

## 2021-01-06 DIAGNOSIS — K219 Gastro-esophageal reflux disease without esophagitis: Secondary | ICD-10-CM | POA: Insufficient documentation

## 2021-01-06 DIAGNOSIS — J449 Chronic obstructive pulmonary disease, unspecified: Secondary | ICD-10-CM | POA: Diagnosis not present

## 2021-01-06 DIAGNOSIS — G4733 Obstructive sleep apnea (adult) (pediatric): Secondary | ICD-10-CM | POA: Insufficient documentation

## 2021-01-06 DIAGNOSIS — I10 Essential (primary) hypertension: Secondary | ICD-10-CM | POA: Diagnosis not present

## 2021-01-06 HISTORY — PX: CARDIOVERSION: SHX1299

## 2021-01-06 SURGERY — CARDIOVERSION
Anesthesia: General

## 2021-01-06 MED ORDER — LIDOCAINE 2% (20 MG/ML) 5 ML SYRINGE
INTRAMUSCULAR | Status: DC | PRN
  Administered 2021-01-06: 60 mg via INTRAVENOUS

## 2021-01-06 MED ORDER — SODIUM CHLORIDE 0.9 % IV SOLN: INTRAVENOUS | Status: DC

## 2021-01-06 MED ORDER — PROPOFOL 10 MG/ML IV BOLUS
INTRAVENOUS | Status: DC | PRN
  Administered 2021-01-06: 60 mg via INTRAVENOUS

## 2021-01-06 NOTE — Discharge Instructions (Signed)

## 2021-01-06 NOTE — Transfer of Care (Signed)
Immediate Anesthesia Transfer of Care Note  Patient: Vicki Swanson  Procedure(s) Performed: CARDIOVERSION  Patient Location: Endoscopy Unit  Anesthesia Type:General  Level of Consciousness: drowsy  Airway & Oxygen Therapy: Patient Spontanous Breathing  Post-op Assessment: Report given to RN and Post -op Vital signs reviewed and stable  Post vital signs: Reviewed and stable  Last Vitals:  Vitals Value Taken Time  BP    Temp    Pulse 51 01/06/21 1230  Resp 17 01/06/21 1230  SpO2 96 % 01/06/21 1230  Vitals shown include unvalidated device data.  Last Pain:  Vitals:   01/06/21 1126  TempSrc: Temporal  PainSc: 0-No pain         Complications: No notable events documented.

## 2021-01-06 NOTE — Anesthesia Procedure Notes (Signed)
Procedure Name: General with mask airway Date/Time: 01/06/2021 12:28 PM Performed by: Samara Deist, CRNA Pre-anesthesia Checklist: Patient identified, Emergency Drugs available, Suction available, Patient being monitored and Timeout performed Patient Re-evaluated:Patient Re-evaluated prior to induction Oxygen Delivery Method: Ambu bag Preoxygenation: Pre-oxygenation with 100% oxygen Induction Type: IV induction Ventilation: Mask ventilation without difficulty Placement Confirmation: positive ETCO2 Dental Injury: Teeth and Oropharynx as per pre-operative assessment

## 2021-01-06 NOTE — Anesthesia Postprocedure Evaluation (Signed)
Anesthesia Post Note  Patient: [name]  Procedure(s) Performed: CARDIOVERSION     Patient location during evaluation: PACU Anesthesia Type: General Level of consciousness: sedated and patient cooperative Pain management: pain level controlled Vital Signs Assessment: post-procedure vital signs reviewed and stable Respiratory status: spontaneous breathing Cardiovascular status: stable Anesthetic complications: no   No notable events documented.  Last Vitals:  Vitals:   01/06/21 1250 01/06/21 1300  BP: (!) 108/50 (!) 109/55  Pulse: (!) 36   Resp: 17   Temp:    SpO2: 98%     Last Pain:  Vitals:   01/06/21 1300  TempSrc:   PainSc: 0-No pain                 Lewie Loron

## 2021-01-06 NOTE — Interval H&P Note (Signed)
History and Physical Interval Note:  01/06/2021 12:08 PM  Vicki Swanson  has presented today for surgery, with the diagnosis of AFIB.  The various methods of treatment have been discussed with the patient and family. After consideration of risks, benefits and other options for treatment, the patient has consented to  Procedure(s): CARDIOVERSION (N/A) as a surgical intervention.  The patient's history has been reviewed, patient examined, no change in status, stable for surgery.  I have reviewed the patient's chart and labs.  Questions were answered to the patient's satisfaction.     Little Ishikawa

## 2021-01-06 NOTE — Anesthesia Preprocedure Evaluation (Addendum)
Anesthesia Evaluation  Patient identified by MRN, date of birth, ID band Patient awake    Reviewed: Allergy & Precautions, NPO status , Patient's Chart, lab work & pertinent test results  Airway Mallampati: II  TM Distance: >3 FB Neck ROM: Full    Dental  (+) Dental Advisory Given, Loose, Teeth Intact   Pulmonary COPD,    Pulmonary exam normal breath sounds clear to auscultation       Cardiovascular hypertension, Pt. on medications and Pt. on home beta blockers +CHF (grade 2 diastolic dysfunction)  Normal cardiovascular exam+ dysrhythmias (eliquis) Atrial Fibrillation  Rhythm:Regular Rate:Normal  Echo 04/14/20: 1. Left ventricular ejection fraction, by estimation, is 65 to 70%. The  left ventricle has normal function. The left ventricle has no regional  wall motion abnormalities. There is mild left ventricular hypertrophy.  Left ventricular diastolic parameters  are consistent with Grade II diastolic dysfunction (pseudonormalization).  2. Right ventricular systolic function is normal. The right ventricular  size is normal. Tricuspid regurgitation signal is inadequate for assessing  PA pressure.  3. Left atrial size was mildly dilated.  4. Right atrial size was mildly dilated.  5. The mitral valve is normal in structure. Trivial mitral valve  regurgitation. No evidence of mitral stenosis.  6. The aortic valve is tricuspid. Aortic valve regurgitation is not  visualized. Mild aortic valve sclerosis is present, with no evidence of  aortic valve stenosis.  7. The inferior vena cava is normal in size with greater than 50%  respiratory variability, suggesting right atrial pressure of 3 mmHg.    Neuro/Psych negative neurological ROS  negative psych ROS   GI/Hepatic Neg liver ROS, GERD  Medicated and Controlled,  Endo/Other  negative endocrine ROS  Renal/GU Renal InsufficiencyRenal diseaseCr 1.4      Musculoskeletal negative musculoskeletal ROS (+)   Abdominal   Peds  Hematology negative hematology ROS (+)   Anesthesia Other Findings   Reproductive/Obstetrics                            Anesthesia Physical  Anesthesia Plan  ASA: 3  Anesthesia Plan: General   Post-op Pain Management:    Induction: Intravenous  PONV Risk Score and Plan: 3 and TIVA, Treatment may vary due to age or medical condition and Propofol infusion  Airway Management Planned: Natural Airway and Mask  Additional Equipment: None  Intra-op Plan:   Post-operative Plan:   Informed Consent: I have reviewed the patients History and Physical, chart, labs and discussed the procedure including the risks, benefits and alternatives for the proposed anesthesia with the patient or authorized representative who has indicated his/her understanding and acceptance.     Dental advisory given  Plan Discussed with: CRNA  Anesthesia Plan Comments:        Anesthesia Quick Evaluation

## 2021-01-06 NOTE — CV Procedure (Signed)
Procedure:   DCCV  Indication:  Symptomatic atrial fibrillation  Procedure Note:  The patient signed informed consent.  They have had had therapeutic anticoagulation with Eliquis greater than 3 weeks.  Anesthesia was administered by Dr. Renold Don and Irean Hong, CRNA.  Adequate airway was maintained throughout and vital followed per protocol.  They were cardioverted x 1 with 200J of biphasic synchronized energy.  They converted to NSR with rate 50s.  There were no apparent complications.  The patient had normal neuro status and respiratory status post procedure with vitals stable as recorded elsewhere.    Follow up:  They will continue on current medical therapy and follow up with cardiology as scheduled.  Epifanio Lesches, MD 01/06/2021 12:25 PM

## 2021-01-07 ENCOUNTER — Encounter (HOSPITAL_COMMUNITY): Payer: Self-pay | Admitting: Cardiology

## 2021-01-07 ENCOUNTER — Ambulatory Visit (HOSPITAL_COMMUNITY): Payer: Medicare Other | Attending: Cardiovascular Disease

## 2021-01-07 DIAGNOSIS — R079 Chest pain, unspecified: Secondary | ICD-10-CM | POA: Insufficient documentation

## 2021-01-07 LAB — MYOCARDIAL PERFUSION IMAGING
Base ST Depression (mm): 0 mm
LV dias vol: 62 mL (ref 46–106)
LV sys vol: 19 mL
Nuc Stress EF: 69 %
Peak HR: 68 {beats}/min
Rest HR: 48 {beats}/min
Rest Nuclear Isotope Dose: 10.2 mCi
SDS: 1
SRS: 0
SSS: 1
ST Depression (mm): 0 mm
Stress Nuclear Isotope Dose: 30.7 mCi
TID: 1.1

## 2021-01-07 MED ORDER — REGADENOSON 0.4 MG/5ML IV SOLN
0.4000 mg | Freq: Once | INTRAVENOUS | Status: AC
  Administered 2021-01-07: 0.4 mg via INTRAVENOUS

## 2021-01-07 MED ORDER — TECHNETIUM TC 99M TETROFOSMIN IV KIT
31.5000 | PACK | Freq: Once | INTRAVENOUS | Status: AC | PRN
  Administered 2021-01-07: 31.5 via INTRAVENOUS
  Filled 2021-01-07: qty 32

## 2021-01-07 MED ORDER — TECHNETIUM TC 99M TETROFOSMIN IV KIT
10.1000 | PACK | Freq: Once | INTRAVENOUS | Status: AC | PRN
  Administered 2021-01-07: 10.1 via INTRAVENOUS
  Filled 2021-01-07: qty 11

## 2021-01-13 ENCOUNTER — Ambulatory Visit (HOSPITAL_COMMUNITY): Payer: Medicare Other | Admitting: Physician Assistant

## 2021-01-20 ENCOUNTER — Ambulatory Visit (INDEPENDENT_AMBULATORY_CARE_PROVIDER_SITE_OTHER): Payer: Medicare Other

## 2021-01-20 ENCOUNTER — Ambulatory Visit (HOSPITAL_COMMUNITY): Payer: Medicare Other | Admitting: Physician Assistant

## 2021-01-20 DIAGNOSIS — I639 Cerebral infarction, unspecified: Secondary | ICD-10-CM | POA: Diagnosis not present

## 2021-01-20 LAB — CUP PACEART REMOTE DEVICE CHECK
Date Time Interrogation Session: 20220920103451
Implantable Pulse Generator Implant Date: 20211215

## 2021-01-22 ENCOUNTER — Encounter (HOSPITAL_COMMUNITY): Payer: Self-pay | Admitting: Physician Assistant

## 2021-01-22 ENCOUNTER — Ambulatory Visit (HOSPITAL_COMMUNITY)
Admission: RE | Admit: 2021-01-22 | Discharge: 2021-01-22 | Disposition: A | Discharge status: Home / Self Care | Payer: Medicare Other | Source: Ambulatory Visit | Attending: Physician Assistant | Admitting: Physician Assistant

## 2021-01-22 ENCOUNTER — Other Ambulatory Visit: Payer: Self-pay

## 2021-01-22 VITALS — BP 142/70 | HR 52 | Ht 62.0 in | Wt 139.4 lb

## 2021-01-22 DIAGNOSIS — Z7901 Long term (current) use of anticoagulants: Secondary | ICD-10-CM | POA: Diagnosis not present

## 2021-01-22 DIAGNOSIS — Z9049 Acquired absence of other specified parts of digestive tract: Secondary | ICD-10-CM | POA: Diagnosis not present

## 2021-01-22 DIAGNOSIS — G4733 Obstructive sleep apnea (adult) (pediatric): Secondary | ICD-10-CM | POA: Diagnosis not present

## 2021-01-22 DIAGNOSIS — Z79899 Other long term (current) drug therapy: Secondary | ICD-10-CM | POA: Diagnosis not present

## 2021-01-22 DIAGNOSIS — I4819 Other persistent atrial fibrillation: Secondary | ICD-10-CM | POA: Insufficient documentation

## 2021-01-22 DIAGNOSIS — I10 Essential (primary) hypertension: Secondary | ICD-10-CM | POA: Diagnosis not present

## 2021-01-22 DIAGNOSIS — D6869 Other thrombophilia: Secondary | ICD-10-CM | POA: Insufficient documentation

## 2021-01-22 DIAGNOSIS — R079 Chest pain, unspecified: Secondary | ICD-10-CM | POA: Diagnosis not present

## 2021-01-22 NOTE — Progress Notes (Signed)
Primary Care Physician: Si Gaul, DO Primary Electrophysiologist: Dr Elberta Fortis  Referring Physician: Dr [name] is a 75 y.o. female with a history of HTN, COPD, GERD, CVA, OSA, and atrial fibrillation who presents for follow up in the [facility]. The patient was admitted on 04/11/2020 with weakness, fatigue, confusion and dehydration. Flu +. She developed transient R facial droop, slurred speech, and aphasia in the hospital. An ILR was placed to monitor for afib. The patient was initially diagnosed with atrial fibrillation 04/29/20 on ILR. Patient was started on Eliquis for a CHADS2VASC score of 6. The device clinic received an alert for ongoing afib with RVR and she underwent DCCV on 05/22/20.  On follow up today, patient is s/p DCCV on 01/06/21. She reports that she feels much better more energy. Her chest tightness has also resolved. Lexiscan was low risk with no ischemia or infarct. She is not using her CPAP machine.   Today, she denies symptoms of palpitations, orthopnea, PND, lower extremity edema, dizziness, presyncope, syncope, bleeding, or neurologic sequela. The patient is tolerating medications without difficulties and is otherwise without complaint today.    Atrial Fibrillation Risk Factors:  she does have symptoms or diagnosis of sleep apnea. She is not compliant with CPAP therapy.  she does not have a history of rheumatic fever.   she has a BMI of Body mass index is 25.5 kg/m.Marland Kitchen Filed Weights   01/22/21 1342  Weight: 63.2 kg     No family history on file.   Atrial Fibrillation Management history:  Previous antiarrhythmic drugs: none Previous cardioversions: 05/22/20, 01/06/21 Previous ablations: none CHADS2VASC score: 6 Anticoagulation history: Eliquis   Past Medical History:  Diagnosis Date   COPD (chronic obstructive pulmonary disease) (HCC)    GERD (gastroesophageal reflux disease)    Hypertension    Past  Surgical History:  Procedure Laterality Date   ABDOMINAL HYSTERECTOMY     BREAST SURGERY     CARDIOVERSION N/A 05/22/2020   Procedure: CARDIOVERSION;  Surgeon: Jake Bathe, MD;  Location: [facility] ENDOSCOPY;  Service: Cardiovascular;  Laterality: N/A;   CARDIOVERSION N/A 01/06/2021   Procedure: CARDIOVERSION;  Surgeon: Little Ishikawa, MD;  Location: [facility] ENDOSCOPY;  Service: Cardiovascular;  Laterality: N/A;   CHOLECYSTECTOMY     LOOP RECORDER INSERTION N/A 04/16/2020   Procedure: LOOP RECORDER INSERTION;  Surgeon: Regan Lemming, MD;  Location: [facility] INVASIVE CV LAB;  Service: Cardiovascular;  Laterality: N/A;   TONSILLECTOMY      Current Outpatient Medications  Medication Sig Dispense Refill   apixaban (ELIQUIS) 2.5 MG TABS tablet Take 1 tablet (2.5 mg total) by mouth 2 (two) times daily. Patient has card for free 30 day supply. 60 tablet 3   atorvastatin (LIPITOR) 40 MG tablet Take 1 tablet (40 mg total) by mouth at bedtime. (Patient taking differently: Take 20 mg by mouth at bedtime.) 30 tablet 0   B Complex Vitamins (VITAMIN B COMPLEX) TABS Take 1 tablet by mouth daily.     gabapentin (NEURONTIN) 100 MG capsule Take 1 capsule (100 mg total) by mouth 3 (three) times daily. 60 capsule 0   lisinopril (ZESTRIL) 20 MG tablet Take 10 mg by mouth daily.     metoprolol tartrate (LOPRESSOR) 50 MG tablet Take 1 tablet (50 mg total) by mouth 2 (two) times daily. 60 tablet 3   pantoprazole (PROTONIX) 40 MG tablet Take 40 mg by mouth daily before breakfast.  sertraline (ZOLOFT) 100 MG tablet Take 150 mg by mouth daily.     topiramate (TOPAMAX) 25 MG tablet Take 50 mg by mouth 2 (two) times daily.     No current facility-administered medications for this encounter.    No Known Allergies  Social History   Socioeconomic History   Marital status: Married    Spouse name: Not on file   Number of children: Not on file   Years of education: Not on file   Highest education level: Not on  file  Occupational History   Not on file  Tobacco Use   Smoking status: Never   Smokeless tobacco: Never  Substance and Sexual Activity   Alcohol use: Never   Drug use: Never   Sexual activity: Not on file  Other Topics Concern   Not on file  Social History Narrative   Not on file   Social Determinants of Health   Financial Resource Strain: Not on file  Food Insecurity: Not on file  Transportation Needs: Not on file  Physical Activity: Not on file  Stress: Not on file  Social Connections: Not on file  Intimate Partner Violence: Not on file     ROS- All systems are reviewed and negative except as per the HPI above.  Physical Exam: Vitals:   01/22/21 1342  BP: (!) 142/70  Pulse: (!) 52  Weight: 63.2 kg  Height: 5\' 2"  (1.575 m)    GEN- The patient is a well appearing elderly female, alert and oriented x 3 today.   HEENT-head normocephalic, atraumatic, sclera clear, conjunctiva pink, hearing intact, trachea midline. Lungs- Clear to ausculation bilaterally, normal work of breathing Heart- Regular rate and rhythm, bradycardia, no murmurs, rubs or gallops  GI- soft, NT, ND, + BS Extremities- no clubbing, cyanosis, or edema MS- no significant deformity or atrophy Skin- no rash or lesion Psych- euthymic mood, full affect Neuro- strength and sensation are intact   Wt Readings from Last 3 Encounters:  01/22/21 63.2 kg  01/07/21 62.6 kg  01/06/21 62.6 kg    EKG today demonstrates  SB, PAC Vent. rate 52 BPM PR interval 156 ms QRS duration 70 ms QT/QTcB 458/425 ms   Echo 04/14/20 demonstrated  1. Left ventricular ejection fraction, by estimation, is 65 to 70%. The  left ventricle has normal function. The left ventricle has no regional  wall motion abnormalities. There is mild left ventricular hypertrophy.  Left ventricular diastolic parameters are consistent with Grade II diastolic dysfunction (pseudonormalization).   2. Right ventricular systolic function is  normal. The right ventricular  size is normal. Tricuspid regurgitation signal is inadequate for assessing PA pressure.   3. Left atrial size was mildly dilated.   4. Right atrial size was mildly dilated.   5. The mitral valve is normal in structure. Trivial mitral valve  regurgitation. No evidence of mitral stenosis.   6. The aortic valve is tricuspid. Aortic valve regurgitation is not  visualized. Mild aortic valve sclerosis is present, with no evidence of  aortic valve stenosis.   7. The inferior vena cava is normal in size with greater than 50%  respiratory variability, suggesting right atrial pressure of 3 mmHg.   Epic records are reviewed at length today  CHA2DS2-VASc Score = 6  The patient's score is based upon: CHF History: 0 HTN History: 1 Diabetes History: 0 Stroke History: 2 Vascular Disease History: 0 Age Score: 2 Gender Score: 1     ASSESSMENT AND PLAN: 1. Persistent Atrial Fibrillation  The patient's CHA2DS2-VASc score is 6, indicating a 9.7% annual risk of stroke.  S/p DCCV 01/06/21 Patient maintaining SR. Continue Eliquis 5 mg BID Continue metoprolol 50 mg BID Continued monitoring with ILR.   2. Secondary Hypercoagulable State (ICD10:  D68.69) The patient is at significant risk for stroke/thromboembolism based upon her CHA2DS2-VASc Score of 6.  Continue Apixaban (Eliquis).   3. HTN Stable, no changes today.  4. OSA The importance of adequate treatment of sleep apnea was discussed today in order to improve our ability to maintain sinus rhythm long term. Patient states she will call sleep medicine office to discuss options to help make CPAP more comfortable.   5. Chest pain Low risk stress test 01/07/21 Resolved.    Follow up in the AF clinic in 6 months.    Jorja Loa PA-C Afib Clinic [facility] [address] [place], [place] [address] [phone] 01/22/2021 1:53 PM

## 2021-01-27 NOTE — Progress Notes (Signed)
Carelink Summary Report / Loop Recorder 

## 2021-02-23 ENCOUNTER — Ambulatory Visit (INDEPENDENT_AMBULATORY_CARE_PROVIDER_SITE_OTHER): Payer: Medicare Other

## 2021-02-23 DIAGNOSIS — I639 Cerebral infarction, unspecified: Secondary | ICD-10-CM

## 2021-02-23 LAB — CUP PACEART REMOTE DEVICE CHECK
Date Time Interrogation Session: 20221024174131
Implantable Pulse Generator Implant Date: 20211215

## 2021-02-25 ENCOUNTER — Ambulatory Visit (HOSPITAL_COMMUNITY): Payer: Medicare Other | Admitting: Physician Assistant

## 2021-03-02 NOTE — Progress Notes (Signed)
Carelink Summary Report / Loop Recorder 

## 2021-03-23 ENCOUNTER — Telehealth: Payer: Self-pay

## 2021-03-23 NOTE — Telephone Encounter (Signed)
LINQ alert received.  11 AF events, currently in progress from 11/21 @ 03:44, HR's 120's-130's Burden 0.5%, Metoprolol, Eliquis prescribed Route to triage LR  Unsuccessful telephone call to patient as voicemail is full. Spoke with husband (on [place]) who states patient is currently receiving a mammogram and has been extremely anxious about the procedure. Husband will have patient call device clinic at [phone] when mammogram appointment is complete. Will assess for RVR symptoms and med compliance. Patient previously engaged with AF clinic. Will encouraged follow up appointment for rate control strategies.

## 2021-03-28 LAB — CUP PACEART REMOTE DEVICE CHECK
Date Time Interrogation Session: 20221126002550
Implantable Pulse Generator Implant Date: 20211215

## 2021-03-30 ENCOUNTER — Ambulatory Visit (INDEPENDENT_AMBULATORY_CARE_PROVIDER_SITE_OTHER): Payer: Medicare Other

## 2021-03-30 DIAGNOSIS — I639 Cerebral infarction, unspecified: Secondary | ICD-10-CM | POA: Diagnosis not present

## 2021-04-06 NOTE — Progress Notes (Signed)
Carelink Summary Report / Loop Recorder 

## 2021-04-28 ENCOUNTER — Ambulatory Visit (HOSPITAL_COMMUNITY)
Admission: RE | Admit: 2021-04-28 | Discharge: 2021-04-28 | Disposition: A | Discharge status: Home / Self Care | Payer: Medicare Other | Source: Ambulatory Visit | Attending: Physician Assistant | Admitting: Physician Assistant

## 2021-04-28 ENCOUNTER — Encounter (HOSPITAL_COMMUNITY): Payer: Self-pay | Admitting: Physician Assistant

## 2021-04-28 ENCOUNTER — Other Ambulatory Visit: Payer: Self-pay

## 2021-04-28 VITALS — BP 142/82 | HR 64 | Ht 62.0 in | Wt 142.2 lb

## 2021-04-28 DIAGNOSIS — Z79899 Other long term (current) drug therapy: Secondary | ICD-10-CM | POA: Insufficient documentation

## 2021-04-28 DIAGNOSIS — G4733 Obstructive sleep apnea (adult) (pediatric): Secondary | ICD-10-CM | POA: Diagnosis not present

## 2021-04-28 DIAGNOSIS — I639 Cerebral infarction, unspecified: Secondary | ICD-10-CM | POA: Insufficient documentation

## 2021-04-28 DIAGNOSIS — J449 Chronic obstructive pulmonary disease, unspecified: Secondary | ICD-10-CM | POA: Insufficient documentation

## 2021-04-28 DIAGNOSIS — K219 Gastro-esophageal reflux disease without esophagitis: Secondary | ICD-10-CM | POA: Diagnosis not present

## 2021-04-28 DIAGNOSIS — I10 Essential (primary) hypertension: Secondary | ICD-10-CM | POA: Insufficient documentation

## 2021-04-28 DIAGNOSIS — D6869 Other thrombophilia: Secondary | ICD-10-CM

## 2021-04-28 DIAGNOSIS — I4819 Other persistent atrial fibrillation: Secondary | ICD-10-CM | POA: Diagnosis present

## 2021-04-28 DIAGNOSIS — Z7901 Long term (current) use of anticoagulants: Secondary | ICD-10-CM | POA: Insufficient documentation

## 2021-04-28 NOTE — Progress Notes (Signed)
Primary Care Physician: Einar Pheasant, DO Primary Electrophysiologist: Dr Curt Bears  Referring Physician: Dr Lalla Brothers is a 75 y.o. female with a history of HTN, COPD, GERD, CVA, OSA, and atrial fibrillation who presents for follow up in the [facility]. The patient was admitted on 04/11/2020 with weakness, fatigue, confusion and dehydration. Flu +. She developed transient R facial droop, slurred speech, and aphasia in the hospital. An ILR was placed to monitor for afib. The patient was initially diagnosed with atrial fibrillation 04/29/20 on ILR. Patient was started on Eliquis for a CHADS2VASC score of 6. The device clinic received an alert for ongoing afib with RVR and she underwent DCCV on 05/22/20.  On follow up today, patient reports that she feels sleepy. Her ILR showed persistent afib for about 2 weeks in November but she did spontaneously convert to SR. She is in SR today. She is not using her CPAP.   Today, she denies symptoms of palpitations, orthopnea, PND, lower extremity edema, dizziness, presyncope, syncope, bleeding, or neurologic sequela. The patient is tolerating medications without difficulties and is otherwise without complaint today.    Atrial Fibrillation Risk Factors:  she does have symptoms or diagnosis of sleep apnea. She is not compliant with CPAP therapy.  she does not have a history of rheumatic fever.   she has a BMI of Body mass index is 26.01 kg/m.Marland Kitchen Filed Weights   04/28/21 0840  Weight: 64.5 kg      No family history on file.   Atrial Fibrillation Management history:  Previous antiarrhythmic drugs: none Previous cardioversions: 05/22/20, 01/06/21 Previous ablations: none CHADS2VASC score: 6 Anticoagulation history: Eliquis   Past Medical History:  Diagnosis Date   COPD (chronic obstructive pulmonary disease) (HCC)    GERD (gastroesophageal reflux disease)    Hypertension    Past Surgical History:   Procedure Laterality Date   ABDOMINAL HYSTERECTOMY     BREAST SURGERY     CARDIOVERSION N/A 05/22/2020   Procedure: CARDIOVERSION;  Surgeon: Jerline Pain, MD;  Location: [facility];  Service: Cardiovascular;  Laterality: N/A;   CARDIOVERSION N/A 01/06/2021   Procedure: CARDIOVERSION;  Surgeon: Donato Heinz, MD;  Location: [facility] ENDOSCOPY;  Service: Cardiovascular;  Laterality: N/A;   CHOLECYSTECTOMY     LOOP RECORDER INSERTION N/A 04/16/2020   Procedure: LOOP RECORDER INSERTION;  Surgeon: Constance Haw, MD;  Location: [facility] CV LAB;  Service: Cardiovascular;  Laterality: N/A;   TONSILLECTOMY      Current Outpatient Medications  Medication Sig Dispense Refill   apixaban (ELIQUIS) 2.5 MG TABS tablet Take 1 tablet (2.5 mg total) by mouth 2 (two) times daily. Patient has card for free 30 day supply. 60 tablet 3   B Complex Vitamins (VITAMIN B COMPLEX) TABS Take 1 tablet by mouth daily.     gabapentin (NEURONTIN) 100 MG capsule Take 1 capsule (100 mg total) by mouth 3 (three) times daily. 60 capsule 0   lisinopril (ZESTRIL) 20 MG tablet Take 10 mg by mouth daily.     metoprolol tartrate (LOPRESSOR) 50 MG tablet Take 1 tablet (50 mg total) by mouth 2 (two) times daily. 60 tablet 3   pantoprazole (PROTONIX) 40 MG tablet Take 40 mg by mouth daily before breakfast.     pravastatin (PRAVACHOL) 20 MG tablet Take 20 mg by mouth daily.     sertraline (ZOLOFT) 100 MG tablet Take 150 mg by mouth daily.     topiramate (  TOPAMAX) 25 MG tablet Take 50 mg by mouth 2 (two) times daily.     No current facility-administered medications for this encounter.    No Known Allergies  Social History   Socioeconomic History   Marital status: Married    Spouse name: Not on file   Number of children: Not on file   Years of education: Not on file   Highest education level: Not on file  Occupational History   Not on file  Tobacco Use   Smoking status: Never   Smokeless tobacco: Never   Substance and Sexual Activity   Alcohol use: Never   Drug use: Never   Sexual activity: Not on file  Other Topics Concern   Not on file  Social History Narrative   Not on file   Social Determinants of Health   Financial Resource Strain: Not on file  Food Insecurity: Not on file  Transportation Needs: Not on file  Physical Activity: Not on file  Stress: Not on file  Social Connections: Not on file  Intimate Partner Violence: Not on file     ROS- All systems are reviewed and negative except as per the HPI above.  Physical Exam: Vitals:   04/28/21 0840  BP: (!) 142/82  Pulse: 64  Weight: 64.5 kg  Height: 5\' 2"  (1.575 m)    GEN- The patient is a well appearing elderly female, alert and oriented x 3 today.   HEENT-head normocephalic, atraumatic, sclera clear, conjunctiva pink, hearing intact, trachea midline. Lungs- Clear to ausculation bilaterally, normal work of breathing Heart- Regular rate and rhythm, occasional ectopic beat, no murmurs, rubs or gallops  GI- soft, NT, ND, + BS Extremities- no clubbing, cyanosis, or edema MS- no significant deformity or atrophy Skin- no rash or lesion Psych- euthymic mood, full affect Neuro- strength and sensation are intact   Wt Readings from Last 3 Encounters:  04/28/21 64.5 kg  01/22/21 63.2 kg  01/07/21 62.6 kg    EKG today demonstrates  SR, PVCs, PAC Vent. rate 64 BPM PR interval 148 ms QRS duration 70 ms QT/QTcB 436/449 ms   Echo 04/14/20 demonstrated  1. Left ventricular ejection fraction, by estimation, is 65 to 70%. The  left ventricle has normal function. The left ventricle has no regional  wall motion abnormalities. There is mild left ventricular hypertrophy.  Left ventricular diastolic parameters are consistent with Grade II diastolic dysfunction (pseudonormalization).   2. Right ventricular systolic function is normal. The right ventricular  size is normal. Tricuspid regurgitation signal is inadequate for  assessing PA pressure.   3. Left atrial size was mildly dilated.   4. Right atrial size was mildly dilated.   5. The mitral valve is normal in structure. Trivial mitral valve  regurgitation. No evidence of mitral stenosis.   6. The aortic valve is tricuspid. Aortic valve regurgitation is not  visualized. Mild aortic valve sclerosis is present, with no evidence of  aortic valve stenosis.   7. The inferior vena cava is normal in size with greater than 50%  respiratory variability, suggesting right atrial pressure of 3 mmHg.   Epic records are reviewed at length today  CHA2DS2-VASc Score = 6  The patient's score is based upon: CHF History: 0 HTN History: 1 Diabetes History: 0 Stroke History: 2 Vascular Disease History: 0 Age Score: 2 Gender Score: 1      ASSESSMENT AND PLAN: 1. Persistent Atrial Fibrillation The patient's CHA2DS2-VASc score is 6, indicating a 9.7% annual risk of stroke.  We discussed rhythm control options for her afib including AAD (flecainide). She would like to work on getting her OSA treated first. If she has quick recurrence of afib, will plan to start flecainide.   Stressed importance on following through with OSA treatment.  Continue Eliquis 5 mg BID Continue metoprolol 50 mg BID  2. Secondary Hypercoagulable State (ICD10:  D68.69) The patient is at significant risk for stroke/thromboembolism based upon her CHA2DS2-VASc Score of 6.  Continue Apixaban (Eliquis).   3. HTN Stable, no changes today.  4. OSA Diagnosed in 2017, previously followed by [facility]  Patient still not back on CPAP.  Patient states she will call [facility] to reestablish care and discuss options for treating her OSA. Also offered referral to [facility] sleep medicine, patient declined.    Follow up in the AF clinic in one month.    Jorja Loa PA-C Afib Clinic [facility] [address] [place], [place] [address] [phone] 04/28/2021 9:01 AM

## 2021-05-05 ENCOUNTER — Ambulatory Visit (INDEPENDENT_AMBULATORY_CARE_PROVIDER_SITE_OTHER): Payer: Medicare Other

## 2021-05-05 DIAGNOSIS — I639 Cerebral infarction, unspecified: Secondary | ICD-10-CM

## 2021-05-05 LAB — CUP PACEART REMOTE DEVICE CHECK
Date Time Interrogation Session: 20230102230818
Implantable Pulse Generator Implant Date: 20211215

## 2021-05-13 NOTE — Progress Notes (Signed)
Carelink Summary Report / Loop Recorder 

## 2021-06-02 ENCOUNTER — Other Ambulatory Visit: Payer: Self-pay

## 2021-06-02 ENCOUNTER — Ambulatory Visit (HOSPITAL_COMMUNITY)
Admission: RE | Admit: 2021-06-02 | Discharge: 2021-06-02 | Disposition: A | Discharge status: Home / Self Care | Payer: Medicare Other | Source: Ambulatory Visit | Attending: Physician Assistant | Admitting: Physician Assistant

## 2021-06-02 ENCOUNTER — Encounter (HOSPITAL_COMMUNITY): Payer: Self-pay | Admitting: Physician Assistant

## 2021-06-02 VITALS — BP 142/86 | HR 56 | Ht 62.0 in | Wt 142.6 lb

## 2021-06-02 DIAGNOSIS — I4819 Other persistent atrial fibrillation: Secondary | ICD-10-CM

## 2021-06-02 DIAGNOSIS — Z79899 Other long term (current) drug therapy: Secondary | ICD-10-CM | POA: Insufficient documentation

## 2021-06-02 DIAGNOSIS — Z7901 Long term (current) use of anticoagulants: Secondary | ICD-10-CM | POA: Insufficient documentation

## 2021-06-02 DIAGNOSIS — R0789 Other chest pain: Secondary | ICD-10-CM | POA: Diagnosis not present

## 2021-06-02 DIAGNOSIS — J449 Chronic obstructive pulmonary disease, unspecified: Secondary | ICD-10-CM | POA: Diagnosis not present

## 2021-06-02 DIAGNOSIS — G4733 Obstructive sleep apnea (adult) (pediatric): Secondary | ICD-10-CM | POA: Insufficient documentation

## 2021-06-02 DIAGNOSIS — I10 Essential (primary) hypertension: Secondary | ICD-10-CM | POA: Insufficient documentation

## 2021-06-02 DIAGNOSIS — D6869 Other thrombophilia: Secondary | ICD-10-CM

## 2021-06-02 DIAGNOSIS — Z8673 Personal history of transient ischemic attack (TIA), and cerebral infarction without residual deficits: Secondary | ICD-10-CM | POA: Diagnosis not present

## 2021-06-02 DIAGNOSIS — K219 Gastro-esophageal reflux disease without esophagitis: Secondary | ICD-10-CM | POA: Insufficient documentation

## 2021-06-02 NOTE — Progress Notes (Addendum)
Primary Care Physician: Einar Pheasant, DO Primary Electrophysiologist: Dr Curt Bears  Referring Physician: Dr Lalla Brothers is a 76 y.o. female with a history of HTN, COPD, GERD, CVA, OSA, and atrial fibrillation who presents for follow up in the [facility]. The patient was admitted on 04/11/2020 with weakness, fatigue, confusion and dehydration. Flu +. She developed transient R facial droop, slurred speech, and aphasia in the hospital. An ILR was placed to monitor for afib. The patient was initially diagnosed with atrial fibrillation 04/29/20 on ILR. Patient was started on Eliquis for a CHADS2VASC score of 6. The device clinic received an alert for ongoing afib with RVR and she underwent DCCV on 05/22/20.  On follow up today, patient reports that she has done well from a cardiac standpoint. ILR shows 0.6% afib burden since the end of 03/2021. She does continue to have chest discomfort. This occurs at night when she lays down and also after large meals. It is not related to exertion.   Today, she denies symptoms of palpitations, orthopnea, PND, lower extremity edema, dizziness, presyncope, syncope, bleeding, or neurologic sequela. The patient is tolerating medications without difficulties and is otherwise without complaint today.    Atrial Fibrillation Risk Factors:  she does have symptoms or diagnosis of sleep apnea. She is not compliant with CPAP therapy.  she does not have a history of rheumatic fever.   she has a BMI of Body mass index is 26.08 kg/m.Marland Kitchen Filed Weights   06/02/21 1322  Weight: 64.7 kg     No family history on file.   Atrial Fibrillation Management history:  Previous antiarrhythmic drugs: none Previous cardioversions: 05/22/20, 01/06/21 Previous ablations: none CHADS2VASC score: 6 Anticoagulation history: Eliquis   Past Medical History:  Diagnosis Date   COPD (chronic obstructive pulmonary disease) (HCC)    GERD  (gastroesophageal reflux disease)    Hypertension    Past Surgical History:  Procedure Laterality Date   ABDOMINAL HYSTERECTOMY     BREAST SURGERY     CARDIOVERSION N/A 05/22/2020   Procedure: CARDIOVERSION;  Surgeon: Jerline Pain, MD;  Location: [facility];  Service: Cardiovascular;  Laterality: N/A;   CARDIOVERSION N/A 01/06/2021   Procedure: CARDIOVERSION;  Surgeon: Donato Heinz, MD;  Location: [facility] ENDOSCOPY;  Service: Cardiovascular;  Laterality: N/A;   CHOLECYSTECTOMY     LOOP RECORDER INSERTION N/A 04/16/2020   Procedure: LOOP RECORDER INSERTION;  Surgeon: Constance Haw, MD;  Location: [facility] CV LAB;  Service: Cardiovascular;  Laterality: N/A;   TONSILLECTOMY      Current Outpatient Medications  Medication Sig Dispense Refill   apixaban (ELIQUIS) 2.5 MG TABS tablet Take 1 tablet (2.5 mg total) by mouth 2 (two) times daily. Patient has card for free 30 day supply. 60 tablet 3   B Complex Vitamins (VITAMIN B COMPLEX) TABS Take 1 tablet by mouth daily.     gabapentin (NEURONTIN) 100 MG capsule Take 1 capsule (100 mg total) by mouth 3 (three) times daily. 60 capsule 0   lisinopril (ZESTRIL) 20 MG tablet Take 10 mg by mouth daily.     metoprolol tartrate (LOPRESSOR) 50 MG tablet Take 1 tablet (50 mg total) by mouth 2 (two) times daily. 60 tablet 3   pantoprazole (PROTONIX) 40 MG tablet Take 40 mg by mouth daily before breakfast.     pravastatin (PRAVACHOL) 20 MG tablet Take 20 mg by mouth daily.     sertraline (ZOLOFT) 100 MG tablet  Take 150 mg by mouth daily.     topiramate (TOPAMAX) 25 MG tablet Take 50 mg by mouth 2 (two) times daily.     No current facility-administered medications for this encounter.    No Known Allergies  Social History   Socioeconomic History   Marital status: Married    Spouse name: Not on file   Number of children: Not on file   Years of education: Not on file   Highest education level: Not on file  Occupational History    Not on file  Tobacco Use   Smoking status: Never   Smokeless tobacco: Never  Substance and Sexual Activity   Alcohol use: Never   Drug use: Never   Sexual activity: Not on file  Other Topics Concern   Not on file  Social History Narrative   Not on file   Social Determinants of Health   Financial Resource Strain: Not on file  Food Insecurity: Not on file  Transportation Needs: Not on file  Physical Activity: Not on file  Stress: Not on file  Social Connections: Not on file  Intimate Partner Violence: Not on file     ROS- All systems are reviewed and negative except as per the HPI above.  Physical Exam: Vitals:   06/02/21 1322  BP: (!) 142/86  Pulse: (!) 56  Weight: 64.7 kg  Height: 5\' 2"  (1.575 m)    GEN- The patient is a well appearing elderly female, alert and oriented x 3 today.   HEENT-head normocephalic, atraumatic, sclera clear, conjunctiva pink, hearing intact, trachea midline. Lungs- Clear to ausculation bilaterally, normal work of breathing Heart- Regular rate and rhythm, occasional ectopic beat, no murmurs, rubs or gallops  GI- soft, NT, ND, + BS Extremities- no clubbing, cyanosis, or edema MS- no significant deformity or atrophy Skin- no rash or lesion Psych- euthymic mood, full affect Neuro- strength and sensation are intact   Wt Readings from Last 3 Encounters:  06/02/21 64.7 kg  04/28/21 64.5 kg  01/22/21 63.2 kg    EKG today demonstrates  SB, PVC, PAC Vent. rate 56 BPM PR interval 154 ms QRS duration 72 ms QT/QTcB 438/422 ms   Echo 04/14/20 demonstrated  1. Left ventricular ejection fraction, by estimation, is 65 to 70%. The  left ventricle has normal function. The left ventricle has no regional  wall motion abnormalities. There is mild left ventricular hypertrophy.  Left ventricular diastolic parameters are consistent with Grade II diastolic dysfunction (pseudonormalization).   2. Right ventricular systolic function is normal. The  right ventricular  size is normal. Tricuspid regurgitation signal is inadequate for assessing PA pressure.   3. Left atrial size was mildly dilated.   4. Right atrial size was mildly dilated.   5. The mitral valve is normal in structure. Trivial mitral valve  regurgitation. No evidence of mitral stenosis.   6. The aortic valve is tricuspid. Aortic valve regurgitation is not  visualized. Mild aortic valve sclerosis is present, with no evidence of  aortic valve stenosis.   7. The inferior vena cava is normal in size with greater than 50%  respiratory variability, suggesting right atrial pressure of 3 mmHg.   Epic records are reviewed at length today  CHA2DS2-VASc Score = 6  The patient's score is based upon: CHF History: 0 HTN History: 1 Diabetes History: 0 Stroke History: 2 Vascular Disease History: 0 Age Score: 2 Gender Score: 1          ASSESSMENT AND PLAN: 1.  Persistent Atrial Fibrillation The patient's CHA2DS2-VASc score is 6, indicating a 9.7% annual risk of stroke.  ILR shows 0.6% interval afib burden. Continue Eliquis 5 mg BID Continue metoprolol 50 mg BID  2. Secondary Hypercoagulable State (ICD10:  D68.69) The patient is at significant risk for stroke/thromboembolism based upon her CHA2DS2-VASc Score of 6.  Continue Apixaban (Eliquis).   3. HTN Stable, no changes today.  4. OSA Diagnosed in 2017, previously followed by [facility]  Patient has not followed through with getting back on CPAP.   5. Chest discomfort Reassuring recent stress test. Pain does not seem typical for cardiac pain. Encouraged her to follow up with PCP.    Follow up in the AF clinic in 6 months.    [facility] [address] [place], [place] [address] [phone] 06/02/2021 1:56 PM

## 2021-06-08 ENCOUNTER — Ambulatory Visit (INDEPENDENT_AMBULATORY_CARE_PROVIDER_SITE_OTHER): Payer: Medicare Other

## 2021-06-08 DIAGNOSIS — I639 Cerebral infarction, unspecified: Secondary | ICD-10-CM

## 2021-06-08 LAB — CUP PACEART REMOTE DEVICE CHECK
Date Time Interrogation Session: 20230205230800
Implantable Pulse Generator Implant Date: 20211215

## 2021-06-10 NOTE — Progress Notes (Signed)
Carelink Summary Report / Loop Recorder 

## 2021-07-13 ENCOUNTER — Ambulatory Visit (INDEPENDENT_AMBULATORY_CARE_PROVIDER_SITE_OTHER): Payer: Medicare Other

## 2021-07-13 DIAGNOSIS — I639 Cerebral infarction, unspecified: Secondary | ICD-10-CM

## 2021-07-13 LAB — CUP PACEART REMOTE DEVICE CHECK
Date Time Interrogation Session: 20230313104820
Implantable Pulse Generator Implant Date: 20211215

## 2021-07-21 ENCOUNTER — Telehealth: Payer: Self-pay

## 2021-07-21 NOTE — Telephone Encounter (Signed)
-----   Message from Will Jorja Loa, MD sent at 07/13/2021  3:42 PM EDT ----- ?Abnormal LINQ reviewed. Notable for rapid AF episodes. Increase metoprolol to 100 mg BID.Marland Kitchen ?

## 2021-07-21 NOTE — Telephone Encounter (Signed)
Attempted to call Pt to advise of recommended increase in metoprolol. ? ?No answer and unable to leave a voicemail. ? ?Will try again. ?

## 2021-07-22 MED ORDER — METOPROLOL TARTRATE 100 MG PO TABS: 100.0000 mg | ORAL_TABLET | Freq: Two times a day (BID) | ORAL | 3 refills | Status: DC

## 2021-07-22 NOTE — Telephone Encounter (Signed)
Outreach made to Pt. ? ?Advised of metoprolol increase and sent to pharmacy. ? ?Scheduled follow up with afib clinic. ? ?

## 2021-07-23 ENCOUNTER — Ambulatory Visit (HOSPITAL_COMMUNITY): Payer: Medicare Other | Admitting: Physician Assistant

## 2021-07-23 NOTE — Progress Notes (Signed)
Carelink Summary Report / Loop Recorder 

## 2021-08-17 ENCOUNTER — Ambulatory Visit (INDEPENDENT_AMBULATORY_CARE_PROVIDER_SITE_OTHER): Payer: Medicare Other

## 2021-08-17 DIAGNOSIS — I639 Cerebral infarction, unspecified: Secondary | ICD-10-CM | POA: Diagnosis not present

## 2021-08-17 LAB — CUP PACEART REMOTE DEVICE CHECK
Date Time Interrogation Session: 20230414230630
Implantable Pulse Generator Implant Date: 20211215

## 2021-09-02 NOTE — Progress Notes (Signed)
Carelink Summary Report / Loop Recorder 

## 2021-09-21 ENCOUNTER — Ambulatory Visit (INDEPENDENT_AMBULATORY_CARE_PROVIDER_SITE_OTHER): Payer: Medicare Other

## 2021-09-21 DIAGNOSIS — I639 Cerebral infarction, unspecified: Secondary | ICD-10-CM

## 2021-09-22 LAB — CUP PACEART REMOTE DEVICE CHECK
Date Time Interrogation Session: 20230523115620
Implantable Pulse Generator Implant Date: 20211215

## 2021-10-07 NOTE — Progress Notes (Signed)
Carelink Summary Report / Loop Recorder 

## 2021-10-26 ENCOUNTER — Ambulatory Visit (INDEPENDENT_AMBULATORY_CARE_PROVIDER_SITE_OTHER): Payer: Medicare Other

## 2021-10-26 DIAGNOSIS — I639 Cerebral infarction, unspecified: Secondary | ICD-10-CM | POA: Diagnosis not present

## 2021-10-27 LAB — CUP PACEART REMOTE DEVICE CHECK
Date Time Interrogation Session: 20230627083258
Implantable Pulse Generator Implant Date: 20211215

## 2021-11-18 NOTE — Progress Notes (Signed)
Carelink Summary Report / Loop Recorder 

## 2021-11-26 LAB — CUP PACEART REMOTE DEVICE CHECK
Date Time Interrogation Session: 20230722231027
Implantable Pulse Generator Implant Date: 20211215

## 2021-11-30 ENCOUNTER — Ambulatory Visit (INDEPENDENT_AMBULATORY_CARE_PROVIDER_SITE_OTHER): Payer: Medicare Other

## 2021-11-30 ENCOUNTER — Encounter (HOSPITAL_COMMUNITY): Payer: Self-pay | Admitting: Physician Assistant

## 2021-11-30 ENCOUNTER — Ambulatory Visit (HOSPITAL_COMMUNITY)
Admission: RE | Admit: 2021-11-30 | Discharge: 2021-11-30 | Disposition: A | Discharge status: Home / Self Care | Payer: Medicare Other | Source: Ambulatory Visit | Attending: Physician Assistant | Admitting: Physician Assistant

## 2021-11-30 VITALS — BP 122/88 | HR 54 | Ht 62.0 in | Wt 148.0 lb

## 2021-11-30 DIAGNOSIS — I4819 Other persistent atrial fibrillation: Secondary | ICD-10-CM | POA: Insufficient documentation

## 2021-11-30 DIAGNOSIS — I639 Cerebral infarction, unspecified: Secondary | ICD-10-CM

## 2021-11-30 DIAGNOSIS — I10 Essential (primary) hypertension: Secondary | ICD-10-CM | POA: Insufficient documentation

## 2021-11-30 DIAGNOSIS — J449 Chronic obstructive pulmonary disease, unspecified: Secondary | ICD-10-CM | POA: Diagnosis not present

## 2021-11-30 DIAGNOSIS — Z7901 Long term (current) use of anticoagulants: Secondary | ICD-10-CM | POA: Diagnosis not present

## 2021-11-30 DIAGNOSIS — Z79899 Other long term (current) drug therapy: Secondary | ICD-10-CM | POA: Insufficient documentation

## 2021-11-30 DIAGNOSIS — G4733 Obstructive sleep apnea (adult) (pediatric): Secondary | ICD-10-CM | POA: Diagnosis not present

## 2021-11-30 DIAGNOSIS — K219 Gastro-esophageal reflux disease without esophagitis: Secondary | ICD-10-CM | POA: Diagnosis not present

## 2021-11-30 DIAGNOSIS — Z8673 Personal history of transient ischemic attack (TIA), and cerebral infarction without residual deficits: Secondary | ICD-10-CM | POA: Diagnosis not present

## 2021-11-30 DIAGNOSIS — D6869 Other thrombophilia: Secondary | ICD-10-CM | POA: Diagnosis not present

## 2021-11-30 NOTE — Progress Notes (Signed)
Primary Care Physician: Einar Pheasant, DO Primary Cardiologist: Dr Beatrix Fetters [place]) Primary Electrophysiologist: Dr Curt Bears  Referring Physician: Dr Lalla Brothers is a 76 y.o. female with a history of HTN, COPD, GERD, CVA, OSA, and atrial fibrillation who presents for follow up in the [facility]. The patient was admitted on 04/11/2020 with weakness, fatigue, confusion and dehydration. Flu +. She developed transient R facial droop, slurred speech, and aphasia in the hospital. An ILR was placed to monitor for afib. The patient was initially diagnosed with atrial fibrillation 04/29/20 on ILR. Patient was started on Eliquis for a CHADS2VASC score of 6. The device clinic received an alert for ongoing afib with RVR and she underwent DCCV on 05/22/20.  On follow up today, patient did have some rapid afib in February and her metoprolol was increased. She has not had any afib on her ILR since. She denies any bleeding issues on anticoagulation.   Today, she denies symptoms of palpitations, orthopnea, PND, lower extremity edema, dizziness, presyncope, syncope, bleeding, or neurologic sequela. The patient is tolerating medications without difficulties and is otherwise without complaint today.    Atrial Fibrillation Risk Factors:  she does have symptoms or diagnosis of sleep apnea. She is not compliant with CPAP therapy.  she does not have a history of rheumatic fever.   she has a BMI of Body mass index is 27.07 kg/m.Marland Kitchen Filed Weights   11/30/21 0906  Weight: 67.1 kg    No family history on file.   Atrial Fibrillation Management history:  Previous antiarrhythmic drugs: none Previous cardioversions: 05/22/20, 01/06/21 Previous ablations: none CHADS2VASC score: 6 Anticoagulation history: Eliquis   Past Medical History:  Diagnosis Date   COPD (chronic obstructive pulmonary disease) (HCC)    GERD (gastroesophageal reflux disease)    Hypertension     Past Surgical History:  Procedure Laterality Date   ABDOMINAL HYSTERECTOMY     BREAST SURGERY     CARDIOVERSION N/A 05/22/2020   Procedure: CARDIOVERSION;  Surgeon: Jerline Pain, MD;  Location: [place];  Service: Cardiovascular;  Laterality: N/A;   CARDIOVERSION N/A 01/06/2021   Procedure: CARDIOVERSION;  Surgeon: Donato Heinz, MD;  Location: [facility] ENDOSCOPY;  Service: Cardiovascular;  Laterality: N/A;   CHOLECYSTECTOMY     LOOP RECORDER INSERTION N/A 04/16/2020   Procedure: LOOP RECORDER INSERTION;  Surgeon: Constance Haw, MD;  Location: [place] CV LAB;  Service: Cardiovascular;  Laterality: N/A;   TONSILLECTOMY      Current Outpatient Medications  Medication Sig Dispense Refill   albuterol (VENTOLIN HFA) 108 (90 Base) MCG/ACT inhaler SMARTSIG:1 Puff(s) By Mouth 4 Times Daily PRN     apixaban (ELIQUIS) 2.5 MG TABS tablet Take 1 tablet (2.5 mg total) by mouth 2 (two) times daily. Patient has card for free 30 day supply. 60 tablet 3   B Complex Vitamins (VITAMIN B COMPLEX) TABS Take 1 tablet by mouth daily.     gabapentin (NEURONTIN) 100 MG capsule Take 1 capsule (100 mg total) by mouth 3 (three) times daily. 60 capsule 0   lisinopril (ZESTRIL) 20 MG tablet Take 10 mg by mouth daily.     metoprolol tartrate (LOPRESSOR) 100 MG tablet Take 1 tablet (100 mg total) by mouth 2 (two) times daily. 180 tablet 3   pantoprazole (PROTONIX) 40 MG tablet Take 40 mg by mouth daily before breakfast.     pravastatin (PRAVACHOL) 20 MG tablet Take 20 mg by mouth daily.  sertraline (ZOLOFT) 100 MG tablet Take 150 mg by mouth daily.     topiramate (TOPAMAX) 25 MG tablet Take 50 mg by mouth 2 (two) times daily.     No current facility-administered medications for this encounter.    No Known Allergies  Social History   Socioeconomic History   Marital status: Married    Spouse name: Not on file   Number of children: Not on file   Years of education: Not on file   Highest  education level: Not on file  Occupational History   Not on file  Tobacco Use   Smoking status: Never   Smokeless tobacco: Never   Tobacco comments:    Never smoke 11/30/21  Substance and Sexual Activity   Alcohol use: Never   Drug use: Never   Sexual activity: Not on file  Other Topics Concern   Not on file  Social History Narrative   Not on file   Social Determinants of Health   Financial Resource Strain: Not on file  Food Insecurity: Not on file  Transportation Needs: Not on file  Physical Activity: Not on file  Stress: Not on file  Social Connections: Not on file  Intimate Partner Violence: Not on file     ROS- All systems are reviewed and negative except as per the HPI above.  Physical Exam: Vitals:   11/30/21 0906  BP: 122/88  Pulse: (!) 54  Weight: 67.1 kg  Height: 5\' 2"  (1.575 m)     GEN- The patient is a well appearing elderly female, alert and oriented x 3 today.   HEENT-head normocephalic, atraumatic, sclera clear, conjunctiva pink, hearing intact, trachea midline. Lungs- Clear to ausculation bilaterally, normal work of breathing Heart- Regular rate and rhythm, occasional ectopic beat, no murmurs, rubs or gallops  GI- soft, NT, ND, + BS Extremities- no clubbing, cyanosis, or edema MS- no significant deformity or atrophy Skin- no rash or lesion Psych- euthymic mood, full affect Neuro- strength and sensation are intact   Wt Readings from Last 3 Encounters:  11/30/21 67.1 kg  06/02/21 64.7 kg  04/28/21 64.5 kg    EKG today demonstrates  SB, bigeminal PACs Vent. rate 54 BPM PR interval * ms QRS duration 68 ms QT/QTcB 428/405 ms   Echo 04/14/20 demonstrated  1. Left ventricular ejection fraction, by estimation, is 65 to 70%. The  left ventricle has normal function. The left ventricle has no regional  wall motion abnormalities. There is mild left ventricular hypertrophy.  Left ventricular diastolic parameters are consistent with Grade II  diastolic dysfunction (pseudonormalization).   2. Right ventricular systolic function is normal. The right ventricular  size is normal. Tricuspid regurgitation signal is inadequate for assessing PA pressure.   3. Left atrial size was mildly dilated.   4. Right atrial size was mildly dilated.   5. The mitral valve is normal in structure. Trivial mitral valve  regurgitation. No evidence of mitral stenosis.   6. The aortic valve is tricuspid. Aortic valve regurgitation is not  visualized. Mild aortic valve sclerosis is present, with no evidence of  aortic valve stenosis.   7. The inferior vena cava is normal in size with greater than 50%  respiratory variability, suggesting right atrial pressure of 3 mmHg.   Epic records are reviewed at length today  CHA2DS2-VASc Score = 6  The patient's score is based upon: CHF History: 0 HTN History: 1 Diabetes History: 0 Stroke History: 2 Vascular Disease History: 0 Age Score: 2 Gender  Score: 1       ASSESSMENT AND PLAN: 1. Persistent Atrial Fibrillation The patient's CHA2DS2-VASc score is 6, indicating a 9.7% annual risk of stroke.  ILR shows no new episodes since February.  Continue Eliquis 5 mg BID Continue metoprolol 100 mg BID  2. Secondary Hypercoagulable State (ICD10:  D68.69) The patient is at significant risk for stroke/thromboembolism based upon her CHA2DS2-VASc Score of 6.  Continue Apixaban (Eliquis).   3. HTN Stable, no changes today.  4. OSA Diagnosed in 2017, previously followed by [facility]  Patient not on CPAP.   Follow up in the AF clinic in one year.    Jorja Loa PA-C Afib Clinic [facility] [address] [place], [place] [address] [phone] 11/30/2021 9:14 AM

## 2021-12-28 ENCOUNTER — Telehealth: Payer: Self-pay

## 2021-12-28 NOTE — Telephone Encounter (Signed)
ILR alert received by [employer]:  ILR alert summary report received. Battery status OK. Normal device function. No new symptom, tachy, brady, or pause episodes. 82  AF episodes, longest duration 14 1/2 hrs, poor rates control.  Presenting ongoing AF, burden 98.6%, Eliquis Monthly summary reports and ROV/PRN Route to triage [place]  Contacted patient to follow up, she reports in the past week:   Decrease in energy Loss of appetite Possible fluid retention - notes fluid accumulation in feet/lower extremities Weakness "Feel Lowsy"  Back and Chest "dull ache" intermittent with exertion X 2 weeks - no discomfort at present Denies SOB  Patient confirms that she continues to take the Metoprolol bid as prescribed back in February.  She has not missed any days and has had no new changes in her medications.   Will forward to [employer], PA in the afib clinic for next steps and follow up with patient.

## 2021-12-31 NOTE — Progress Notes (Signed)
Carelink Summary Report / Loop Recorder 

## 2022-01-01 ENCOUNTER — Ambulatory Visit (INDEPENDENT_AMBULATORY_CARE_PROVIDER_SITE_OTHER): Payer: Medicare Other

## 2022-01-01 DIAGNOSIS — I639 Cerebral infarction, unspecified: Secondary | ICD-10-CM | POA: Diagnosis not present

## 2022-01-05 LAB — CUP PACEART REMOTE DEVICE CHECK
Date Time Interrogation Session: 20230905112122
Implantable Pulse Generator Implant Date: 20211215

## 2022-01-11 ENCOUNTER — Ambulatory Visit (HOSPITAL_COMMUNITY)
Admission: RE | Admit: 2022-01-11 | Discharge: 2022-01-11 | Disposition: A | Discharge status: Home / Self Care | Payer: Medicare Other | Source: Ambulatory Visit | Attending: Physician Assistant | Admitting: Physician Assistant

## 2022-01-11 VITALS — BP 170/66 | HR 53 | Ht 62.0 in | Wt 152.2 lb

## 2022-01-11 DIAGNOSIS — I10 Essential (primary) hypertension: Secondary | ICD-10-CM | POA: Insufficient documentation

## 2022-01-11 DIAGNOSIS — K219 Gastro-esophageal reflux disease without esophagitis: Secondary | ICD-10-CM | POA: Diagnosis not present

## 2022-01-11 DIAGNOSIS — Z638 Other specified problems related to primary support group: Secondary | ICD-10-CM | POA: Insufficient documentation

## 2022-01-11 DIAGNOSIS — Z7901 Long term (current) use of anticoagulants: Secondary | ICD-10-CM | POA: Diagnosis not present

## 2022-01-11 DIAGNOSIS — Z79899 Other long term (current) drug therapy: Secondary | ICD-10-CM | POA: Diagnosis not present

## 2022-01-11 DIAGNOSIS — J449 Chronic obstructive pulmonary disease, unspecified: Secondary | ICD-10-CM | POA: Diagnosis not present

## 2022-01-11 DIAGNOSIS — G4733 Obstructive sleep apnea (adult) (pediatric): Secondary | ICD-10-CM | POA: Insufficient documentation

## 2022-01-11 DIAGNOSIS — D6869 Other thrombophilia: Secondary | ICD-10-CM | POA: Insufficient documentation

## 2022-01-11 DIAGNOSIS — I4819 Other persistent atrial fibrillation: Secondary | ICD-10-CM | POA: Diagnosis not present

## 2022-01-11 MED ORDER — APIXABAN 5 MG PO TABS: 5.0000 mg | ORAL_TABLET | Freq: Two times a day (BID) | ORAL | 3 refills | Status: DC

## 2022-01-11 NOTE — Progress Notes (Addendum)
Primary Care Physician: Si Gaul, DO Primary Cardiologist: Dr Desma Maxim [facility]) Primary Electrophysiologist: Dr Elberta Fortis  Referring Physician: Dr [name] is a 76 y.o. female with a history of HTN, COPD, GERD, CVA, OSA, and atrial fibrillation who presents for follow up in the [facility]rial [facility]ibrillation [facility]. The patient was admitted on 04/11/2020 with weakness, fatigue, confusion and dehydration. Flu +. She developed transient R facial droop, slurred speech, and aphasia in the hospital. An ILR was placed to monitor for afib. The patient was initially diagnosed with atrial fibrillation 04/29/20 on ILR. Patient was started on Eliquis for a CHADS2VASC score of 6. The device clinic received an alert for ongoing afib with RVR and she underwent DCCV on 05/22/20.  On follow up today, patient is back in SR. ILR shows persistent afib from 8/24-9/2. She admits that she was under considerable stress with a social situation with her granddaughter. She denies any bleeding issues on anticoagulation. She does admit to daytime somnolence.   Today, she denies symptoms of palpitations, orthopnea, PND, lower extremity edema, dizziness, presyncope, syncope, bleeding, or neurologic sequela. The patient is tolerating medications without difficulties and is otherwise without complaint today.    Atrial Fibrillation Risk Factors:  she does have symptoms or diagnosis of sleep apnea. She is not compliant with CPAP therapy.  she does not have a history of rheumatic fever.   she has a BMI of Body mass index is 27.84 kg/m.Marland Kitchen Filed Weights   01/11/22 1455  Weight: 69 kg    No family history on file.   Atrial Fibrillation Management history:  Previous antiarrhythmic drugs: none Previous cardioversions: 05/22/20, 01/06/21 Previous ablations: none CHADS2VASC score: 6 Anticoagulation history: Eliquis   Past Medical History:  Diagnosis Date   COPD (chronic obstructive pulmonary  disease) (HCC)    GERD (gastroesophageal reflux disease)    Hypertension    Past Surgical History:  Procedure Laterality Date   ABDOMINAL HYSTERECTOMY     BREAST SURGERY     CARDIOVERSION N/A 05/22/2020   Procedure: CARDIOVERSION;  Surgeon: Jake Bathe, MD;  Location: [facility] ENDOSCOPY;  Service: Cardiovascular;  Laterality: N/A;   CARDIOVERSION N/A 01/06/2021   Procedure: CARDIOVERSION;  Surgeon: Little Ishikawa, MD;  Location[facility] ENDOSCOPY;  Service: Cardiovascular;  Laterality: N/A;   CHOLECYSTECTOMY     LOOP RECORDER INSERTION N/A 04/16/2020   Procedure: LOOP RECORDER INSERTION;  Surgeon: Regan Lemming, MD;  Location: [facility] INVASIVE CV LAB;  Service: Cardiovascular;  Laterality: N/A;   TONSILLECTOMY      Current Outpatient Medications  Medication Sig Dispense Refill   albuterol (VENTOLIN HFA) 108 (90 Base) MCG/ACT inhaler SMARTSIG:1 Puff(s) By Mouth 4 Times Daily PRN     apixaban (ELIQUIS) 2.5 MG TABS tablet Take 1 tablet (2.5 mg total) by mouth 2 (two) times daily. Patient has card for free 30 day supply. 60 tablet 3   B Complex Vitamins (VITAMIN B COMPLEX) TABS Take 1 tablet by mouth daily.     gabapentin (NEURONTIN) 100 MG capsule Take 1 capsule (100 mg total) by mouth 3 (three) times daily. 60 capsule 0   lisinopril (ZESTRIL) 20 MG tablet Take 10 mg by mouth daily.     metoprolol tartrate (LOPRESSOR) 100 MG tablet Take 1 tablet (100 mg total) by mouth 2 (two) times daily. 180 tablet 3   pantoprazole (PROTONIX) 40 MG tablet Take 40 mg by mouth daily before breakfast.     pravastatin (PRAVACHOL) 20 MG  tablet Take 20 mg by mouth daily.     sertraline (ZOLOFT) 100 MG tablet Take 150 mg by mouth daily.     topiramate (TOPAMAX) 25 MG tablet Take 50 mg by mouth 2 (two) times daily.     No current facility-administered medications for this encounter.    No Known Allergies  Social History   Socioeconomic History   Marital status: Married    Spouse name: Not on file    Number of children: Not on file   Years of education: Not on file   Highest education level: Not on file  Occupational History   Not on file  Tobacco Use   Smoking status: Never   Smokeless tobacco: Never   Tobacco comments:    Never smoke 11/30/21  Substance and Sexual Activity   Alcohol use: Never   Drug use: Never   Sexual activity: Not on file  Other Topics Concern   Not on file  Social History Narrative   Not on file   Social Determinants of Health   Financial Resource Strain: Not on file  Food Insecurity: Not on file  Transportation Needs: Not on file  Physical Activity: Not on file  Stress: Not on file  Social Connections: Not on file  Intimate Partner Violence: Not on file     ROS- All systems are reviewed and negative except as per the HPI above.  Physical Exam: Vitals:   01/11/22 1455  BP: (!) 170/66  Pulse: (!) 53  Weight: 69 kg  Height: 5\' 2"  (1.575 m)    GEN- The patient is a well appearing elderly female, alert and oriented x 3 today.   HEENT-head normocephalic, atraumatic, sclera clear, conjunctiva pink, hearing intact, trachea midline. Lungs- Clear to ausculation bilaterally, normal work of breathing Heart- Regular rate and rhythm, occasional ectopic beat, no murmurs, rubs or gallops  GI- soft, NT, ND, + BS Extremities- no clubbing, cyanosis, or edema MS- no significant deformity or atrophy Skin- no rash or lesion Psych- euthymic mood, full affect Neuro- strength and sensation are intact   Wt Readings from Last 3 Encounters:  01/11/22 69 kg  11/30/21 67.1 kg  06/02/21 64.7 kg    EKG today demonstrates  SB, PACs Vent. rate 53 BPM PR interval 158 ms QRS duration 66 ms QT/QTcB 456/427 ms   Echo 04/14/20 demonstrated  1. Left ventricular ejection fraction, by estimation, is 65 to 70%. The  left ventricle has normal function. The left ventricle has no regional  wall motion abnormalities. There is mild left ventricular hypertrophy.   Left ventricular diastolic parameters are consistent with Grade II diastolic dysfunction (pseudonormalization).   2. Right ventricular systolic function is normal. The right ventricular  size is normal. Tricuspid regurgitation signal is inadequate for assessing PA pressure.   3. Left atrial size was mildly dilated.   4. Right atrial size was mildly dilated.   5. The mitral valve is normal in structure. Trivial mitral valve  regurgitation. No evidence of mitral stenosis.   6. The aortic valve is tricuspid. Aortic valve regurgitation is not  visualized. Mild aortic valve sclerosis is present, with no evidence of  aortic valve stenosis.   7. The inferior vena cava is normal in size with greater than 50%  respiratory variability, suggesting right atrial pressure of 3 mmHg.   Epic records are reviewed at length today  CHA2DS2-VASc Score = 6  The patient's score is based upon: CHF History: 0 HTN History: 1 Diabetes History: 0 Stroke  History: 2 Vascular Disease History: 0 Age Score: 2 Gender Score: 1       ASSESSMENT AND PLAN: 1. Persistent Atrial Fibrillation The patient's CHA2DS2-VASc score is 6, indicating a 9.7% annual risk of stroke.  ILR shows persistent afib from 8/24-9/2. ? Related to family stressor. We discussed potentially starting AAD (flecainide). Patient would like to continue her present therapy for now. I also think untreated OSA is contributing to her afib, HTN, and somnolence.  Increase Eliquis to 5 mg BID (renal function improved and weight above 60 kg).  Continue metoprolol 100 mg BID  2. Secondary Hypercoagulable State (ICD10:  D68.69) The patient is at significant risk for stroke/thromboembolism based upon her CHA2DS2-VASc Score of 6.  Continue Apixaban (Eliquis).   3. HTN Elevated today, typically reasonably well controlled at home. Could consider increasing lisinopril. Getting back on CPAP would also help.  4. OSA Diagnosed in 2017, previously followed by  [facility]  Stressed importance of resuming CPAP use. She plans to establish care with new PCP and will resume using her CPAP.   Follow up in the AF clinic in 2 months.    Jorja Loa PA-C Afib Clinic [facility] [address] [place], [place] [address] [phone] 01/11/2022 3:18 PM

## 2022-01-11 NOTE — Patient Instructions (Signed)
Increase Eliquis 5mg twice a day 

## 2022-01-21 NOTE — Progress Notes (Signed)
Carelink Summary Report / Loop Recorder 

## 2022-01-29 ENCOUNTER — Telehealth: Payer: Self-pay

## 2022-01-29 NOTE — Telephone Encounter (Signed)
Attempted to call Pt.  Left message requesting call back to assess for symptoms.  LINQ alert received.  AF persistent per compass, poor rate control Burden 99.6%, Eliquis, Metoprolol 9/11 AFC, discussed Flecainide, pt. wished to continue current medications. Re-route for ongoing AF with RVR

## 2022-02-01 NOTE — Telephone Encounter (Signed)
Called patient and reports increased fatigue, shortness of breath and palpitations for sometime now. Patient voiced interest in medication changes to see if she could feel better. Has seen AF clinic in past. Advised I will forward to AF clinic for review. Patient aware/agreeable to call if any changes or symptoms occur.   Presenting rhythm this morning 02/01/22.

## 2022-02-01 NOTE — Telephone Encounter (Signed)
Called and left a vm.

## 2022-02-02 LAB — CUP PACEART REMOTE DEVICE CHECK
Date Time Interrogation Session: 20231003000124
Implantable Pulse Generator Implant Date: 20211215

## 2022-02-03 ENCOUNTER — Ambulatory Visit (INDEPENDENT_AMBULATORY_CARE_PROVIDER_SITE_OTHER): Payer: Medicare Other

## 2022-02-03 DIAGNOSIS — I639 Cerebral infarction, unspecified: Secondary | ICD-10-CM

## 2022-02-08 NOTE — Progress Notes (Signed)
Carelink Summary Report / Loop Recorder 

## 2022-02-18 ENCOUNTER — Telehealth: Payer: Self-pay

## 2022-02-18 NOTE — Telephone Encounter (Signed)
ILR alert report received. Battery status OK. Normal device function. No new symptom, tachy, brady, or pause episodes. In an AF episode, AF burden is 100% of the time. AF rates are not well controlled according to histogram and presenting rhythm.  Sent to triage.  Monthly summary reports and ROV/PRN.   Attempted to contact patient to assess s/s and medication compliance. No answer, LMTCB.

## 2022-02-19 NOTE — Telephone Encounter (Signed)
Spoke with patient, patient reported compliance with medications, asked patient if she felt like her heart was racing she stated at times she does, advised patient I would forward to AF clinic for further recommendations, advised patient that she does have an appointment on 03/15/2022 with AF clinic.

## 2022-03-08 ENCOUNTER — Ambulatory Visit (INDEPENDENT_AMBULATORY_CARE_PROVIDER_SITE_OTHER): Payer: Medicare Other

## 2022-03-08 DIAGNOSIS — I639 Cerebral infarction, unspecified: Secondary | ICD-10-CM

## 2022-03-08 LAB — CUP PACEART REMOTE DEVICE CHECK
Date Time Interrogation Session: 20231105231656
Implantable Pulse Generator Implant Date: 20211215

## 2022-03-15 ENCOUNTER — Ambulatory Visit (HOSPITAL_COMMUNITY): Payer: Medicare Other | Admitting: Physician Assistant

## 2022-03-15 NOTE — Progress Notes (Incomplete)
Primary Care Physician: Si Gaul, DO Primary Cardiologist: Dr Desma Maxim [facility]) Primary Electrophysiologist: Dr Elberta Fortis  Referring Physician: Dr [name] is a 76 y.o. female with a history of HTN, COPD, GERD, CVA, OSA, and atrial fibrillation who presents for follow up in the [facility]ibr[facility]. The patient was admitted on 04/11/2020 with weakness, fatigue, confusion and dehydration. Flu +. She developed transient R facial droop, slurred speech, and aphasia in the hospital. An ILR was placed to monitor for afib. The patient was initially diagnosed with atrial fibrillation 04/29/20 on ILR. Patient was started on Eliquis for a CHADS2VASC score of 6. The device clinic received an alert for ongoing afib with RVR and she underwent DCCV on 05/22/20.  On follow up today, ***. ILR shows persistent afib from 10/16-10/20 with an overall burden of 35%. ***  Today, she denies symptoms of ***palpitations, orthopnea, PND, lower extremity edema, dizziness, presyncope, syncope, bleeding, or neurologic sequela. The patient is tolerating medications without difficulties and is otherwise without complaint today.    Atrial Fibrillation Risk Factors:  she does have symptoms or diagnosis of sleep apnea. She is not compliant with CPAP therapy.  she does not have a history of rheumatic fever.   she has a BMI of There is no height or weight on file to calculate BMI.. There were no vitals filed for this visit.   No family history on file.   Atrial Fibrillation Management history:  Previous antiarrhythmic drugs: none Previous cardioversions: 05/22/20, 01/06/21 Previous ablations: none CHADS2VASC score: 6 Anticoagulation history: Eliquis   Past Medical History:  Diagnosis Date   COPD (chronic obstructive pulmonary disease) (HCC)    GERD (gastroesophageal reflux disease)    Hypertension    Past Surgical History:  Procedure Laterality Date   ABDOMINAL  HYSTERECTOMY     BREAST SURGERY     CARDIOVERSION N/A 05/22/2020   Procedure: CARDIOVERSION;  Surgeon: Jake Bathe, MD;  Location: [facility] ENDOSCOPY;  Service: Cardiovascular;  Laterality: N/A;   CARDIOVERSION N/A 01/06/2021   Procedure: CARDIOVERSION;  Surgeon: Little Ishikawa, MD;  Location: [facility] ENDOSCOPY;  Service: Cardiovascular;  Laterality: N/A;   CHOLECYSTECTOMY     LOOP RECORDER INSERTION N/A 04/16/2020   Procedure: LOOP RECORDER INSERTION;  Surgeon: Regan Lemming, MD;  Location: [facility] INVASIVE CV LAB;  Service: Cardiovascular;  Laterality: N/A;   TONSILLECTOMY      Current Outpatient Medications  Medication Sig Dispense Refill   albuterol (VENTOLIN HFA) 108 (90 Base) MCG/ACT inhaler SMARTSIG:1 Puff(s) By Mouth 4 Times Daily PRN     apixaban (ELIQUIS) 5 MG TABS tablet Take 1 tablet (5 mg total) by mouth 2 (two) times daily. 60 tablet 3   B Complex Vitamins (VITAMIN B COMPLEX) TABS Take 1 tablet by mouth daily.     gabapentin (NEURONTIN) 100 MG capsule Take 1 capsule (100 mg total) by mouth 3 (three) times daily. 60 capsule 0   lisinopril (ZESTRIL) 20 MG tablet Take 10 mg by mouth daily.     metoprolol tartrate (LOPRESSOR) 100 MG tablet Take 1 tablet (100 mg total) by mouth 2 (two) times daily. 180 tablet 3   pantoprazole (PROTONIX) 40 MG tablet Take 40 mg by mouth daily before breakfast.     pravastatin (PRAVACHOL) 20 MG tablet Take 20 mg by mouth daily.     sertraline (ZOLOFT) 100 MG tablet Take 150 mg by mouth daily.     topiramate (TOPAMAX) 25 MG tablet Take  50 mg by mouth 2 (two) times daily.     No current facility-administered medications for this visit.    No Known Allergies  Social History   Socioeconomic History   Marital status: Married    Spouse name: Not on file   Number of children: Not on file   Years of education: Not on file   Highest education level: Not on file  Occupational History   Not on file  Tobacco Use   Smoking status: Never    Smokeless tobacco: Never   Tobacco comments:    Never smoke 11/30/21  Substance and Sexual Activity   Alcohol use: Never   Drug use: Never   Sexual activity: Not on file  Other Topics Concern   Not on file  Social History Narrative   Not on file   Social Determinants of Health   Financial Resource Strain: Not on file  Food Insecurity: Not on file  Transportation Needs: Not on file  Physical Activity: Not on file  Stress: Not on file  Social Connections: Not on file  Intimate Partner Violence: Not on file     ROS- All systems are reviewed and negative except as per the HPI above.  Physical Exam: There were no vitals filed for this visit.   GEN- The patient is a well appearing *** {Desc; female/female:11659}, alert and oriented x 3 today.   HEENT-head normocephalic, atraumatic, sclera clear, conjunctiva pink, hearing intact, trachea midline. Lungs- Clear to ausculation bilaterally, normal work of breathing Heart- ***Regular rate and rhythm, no murmurs, rubs or gallops  GI- soft, NT, ND, + BS Extremities- no clubbing, cyanosis, or edema MS- no significant deformity or atrophy Skin- no rash or lesion Psych- euthymic mood, full affect Neuro- strength and sensation are intact   Wt Readings from Last 3 Encounters:  01/11/22 69 kg  11/30/21 67.1 kg  06/02/21 64.7 kg    EKG today demonstrates  ***   Echo 04/14/20 demonstrated  1. Left ventricular ejection fraction, by estimation, is 65 to 70%. The  left ventricle has normal function. The left ventricle has no regional  wall motion abnormalities. There is mild left ventricular hypertrophy.  Left ventricular diastolic parameters are consistent with Grade II diastolic dysfunction (pseudonormalization).   2. Right ventricular systolic function is normal. The right ventricular  size is normal. Tricuspid regurgitation signal is inadequate for assessing PA pressure.   3. Left atrial size was mildly dilated.   4. Right atrial  size was mildly dilated.   5. The mitral valve is normal in structure. Trivial mitral valve  regurgitation. No evidence of mitral stenosis.   6. The aortic valve is tricuspid. Aortic valve regurgitation is not  visualized. Mild aortic valve sclerosis is present, with no evidence of  aortic valve stenosis.   7. The inferior vena cava is normal in size with greater than 50%  respiratory variability, suggesting right atrial pressure of 3 mmHg.   Epic records are reviewed at length today  CHA2DS2-VASc Score = 6  The patient's score is based upon: CHF History: 0 HTN History: 1 Diabetes History: 0 Stroke History: 2 Vascular Disease History: 0 Age Score: 2 Gender Score: 1       ASSESSMENT AND PLAN: 1. Persistent Atrial Fibrillation The patient's CHA2DS2-VASc score is 6, indicating a 9.7% annual risk of stroke.  ILR shows persistent afib from 10/16-10/20. *** We discussed potentially starting AAD (flecainide). Patient would like to continue her present therapy for now. I also think untreated  OSA is contributing to her afib, HTN, and somnolence.  Continue Eliquis 5 mg BID  Continue metoprolol 100 mg BID  2. Secondary Hypercoagulable State (ICD10:  D68.69) The patient is at significant risk for stroke/thromboembolism based upon her CHA2DS2-VASc Score of 6.  Continue Apixaban (Eliquis).   3. HTN ***Elevated today, typically reasonably well controlled at home. Could consider increasing lisinopril.  4. OSA Diagnosed in 2017, previously followed by [facility]  Stressed importance of resuming CPAP use. She plans to establish care with new PCP and will resume using her CPAP.   Follow up ***   Jorja Loa PA-C Afib Clinic [facility] [address] [place], [place] [address] [phone] 03/15/2022 1:15 PM

## 2022-04-05 NOTE — Progress Notes (Signed)
Carelink Summary Report / Loop Recorder 

## 2022-04-09 ENCOUNTER — Telehealth: Payer: Self-pay

## 2022-04-09 NOTE — Telephone Encounter (Signed)
Following alert received from [employer] received for Persistent AF, overall controlled rates, burden 99.6%, Eliquis.  Attempted to call patient to assess s/s and medication compliance. Possibly send info to AF clinic. No answer, LMTCB.

## 2022-04-12 ENCOUNTER — Ambulatory Visit (INDEPENDENT_AMBULATORY_CARE_PROVIDER_SITE_OTHER): Payer: Medicare Other

## 2022-04-12 DIAGNOSIS — I639 Cerebral infarction, unspecified: Secondary | ICD-10-CM

## 2022-04-12 LAB — CUP PACEART REMOTE DEVICE CHECK
Date Time Interrogation Session: 20231210230308
Implantable Pulse Generator Implant Date: 20211215

## 2022-04-13 NOTE — Telephone Encounter (Signed)
Attempted all numbers on file. VM left on patients VM. Unable to leave VM on husbands. Mychart is pending.

## 2022-04-15 DIAGNOSIS — I509 Heart failure, unspecified: Secondary | ICD-10-CM | POA: Diagnosis not present

## 2022-04-15 DIAGNOSIS — E559 Vitamin D deficiency, unspecified: Secondary | ICD-10-CM | POA: Diagnosis not present

## 2022-04-15 DIAGNOSIS — R0600 Dyspnea, unspecified: Secondary | ICD-10-CM | POA: Diagnosis not present

## 2022-04-15 DIAGNOSIS — N1831 Chronic kidney disease, stage 3a: Secondary | ICD-10-CM | POA: Diagnosis not present

## 2022-04-21 NOTE — Telephone Encounter (Signed)
Have attempted to contact patient numerous times patient has no-showed appointments closing encounter

## 2022-05-11 ENCOUNTER — Telehealth: Payer: Self-pay | Admitting: *Deleted

## 2022-05-11 MED ORDER — DILTIAZEM HCL ER COATED BEADS 180 MG PO CP24: 180.0000 mg | ORAL_CAPSULE | Freq: Every day | ORAL | 3 refills | Status: DC

## 2022-05-11 NOTE — Telephone Encounter (Signed)
-----   Message from Will Meredith Leeds, MD sent at 04/12/2022  2:06 PM EST ----- Abnormal ILR reviewed. Notable for AF with rapid rates.  Start diltiazem 180 mg daily.

## 2022-05-11 NOTE — Telephone Encounter (Signed)
Informed patient of results and verbal understanding expressed. Rx sent to pharmacy. Pt advised to contact office if SE occur after medication start. Patient verbalized understanding and agreeable to plan.   Pt reports elevated BPs at home. Informed that this medication may help, to monitor, and call PCP if further issues w/ BP

## 2022-05-14 NOTE — Progress Notes (Signed)
Carelink Summary Report / Loop Recorder

## 2022-05-17 ENCOUNTER — Ambulatory Visit (INDEPENDENT_AMBULATORY_CARE_PROVIDER_SITE_OTHER): Payer: Medicare Other

## 2022-05-17 DIAGNOSIS — I639 Cerebral infarction, unspecified: Secondary | ICD-10-CM | POA: Diagnosis not present

## 2022-05-18 LAB — CUP PACEART REMOTE DEVICE CHECK
Date Time Interrogation Session: 20240112230852
Implantable Pulse Generator Implant Date: 20211215

## 2022-06-07 DIAGNOSIS — Z1231 Encounter for screening mammogram for malignant neoplasm of breast: Secondary | ICD-10-CM | POA: Diagnosis not present

## 2022-06-18 LAB — CUP PACEART REMOTE DEVICE CHECK
Date Time Interrogation Session: 20240214230937
Implantable Pulse Generator Implant Date: 20211215

## 2022-06-21 ENCOUNTER — Ambulatory Visit: Payer: Medicare Other

## 2022-06-21 DIAGNOSIS — I639 Cerebral infarction, unspecified: Secondary | ICD-10-CM

## 2022-06-24 NOTE — Progress Notes (Signed)
Carelink Summary Report / Loop Recorder 

## 2022-07-23 LAB — CUP PACEART REMOTE DEVICE CHECK
Date Time Interrogation Session: 20240318230749
Implantable Pulse Generator Implant Date: 20211215

## 2022-07-26 ENCOUNTER — Ambulatory Visit (INDEPENDENT_AMBULATORY_CARE_PROVIDER_SITE_OTHER): Payer: Medicare Other

## 2022-07-26 DIAGNOSIS — I639 Cerebral infarction, unspecified: Secondary | ICD-10-CM

## 2022-08-02 NOTE — Progress Notes (Signed)
Carelink Summary Report / Loop Recorder 

## 2022-08-30 ENCOUNTER — Ambulatory Visit (INDEPENDENT_AMBULATORY_CARE_PROVIDER_SITE_OTHER): Payer: Medicare Other

## 2022-08-30 DIAGNOSIS — I639 Cerebral infarction, unspecified: Secondary | ICD-10-CM | POA: Diagnosis not present

## 2022-08-30 LAB — CUP PACEART REMOTE DEVICE CHECK
Date Time Interrogation Session: 20240428230635
Implantable Pulse Generator Implant Date: 20211215

## 2022-09-01 ENCOUNTER — Other Ambulatory Visit (HOSPITAL_COMMUNITY): Payer: Self-pay | Admitting: Physician Assistant

## 2022-09-02 NOTE — Progress Notes (Signed)
Carelink Summary Report / Loop Recorder 

## 2022-09-06 DIAGNOSIS — I480 Paroxysmal atrial fibrillation: Secondary | ICD-10-CM | POA: Diagnosis not present

## 2022-09-06 DIAGNOSIS — E782 Mixed hyperlipidemia: Secondary | ICD-10-CM | POA: Diagnosis not present

## 2022-09-06 DIAGNOSIS — I10 Essential (primary) hypertension: Secondary | ICD-10-CM | POA: Diagnosis not present

## 2022-09-06 DIAGNOSIS — Z95818 Presence of other cardiac implants and grafts: Secondary | ICD-10-CM | POA: Diagnosis not present

## 2022-09-28 DIAGNOSIS — J3489 Other specified disorders of nose and nasal sinuses: Secondary | ICD-10-CM | POA: Diagnosis not present

## 2022-09-28 DIAGNOSIS — Z8673 Personal history of transient ischemic attack (TIA), and cerebral infarction without residual deficits: Secondary | ICD-10-CM | POA: Diagnosis not present

## 2022-09-29 NOTE — Progress Notes (Signed)
Carelink Summary Report / Loop Recorder 

## 2022-10-04 ENCOUNTER — Ambulatory Visit (INDEPENDENT_AMBULATORY_CARE_PROVIDER_SITE_OTHER): Payer: Medicare Other

## 2022-10-04 DIAGNOSIS — I639 Cerebral infarction, unspecified: Secondary | ICD-10-CM

## 2022-10-04 DIAGNOSIS — K080 Exfoliation of teeth due to systemic causes: Secondary | ICD-10-CM | POA: Diagnosis not present

## 2022-10-04 LAB — CUP PACEART REMOTE DEVICE CHECK
Date Time Interrogation Session: 20240602231517
Implantable Pulse Generator Implant Date: 20211215

## 2022-10-26 DIAGNOSIS — I10 Essential (primary) hypertension: Secondary | ICD-10-CM | POA: Diagnosis not present

## 2022-10-26 DIAGNOSIS — U071 COVID-19: Secondary | ICD-10-CM | POA: Diagnosis not present

## 2022-10-26 DIAGNOSIS — Z79899 Other long term (current) drug therapy: Secondary | ICD-10-CM | POA: Diagnosis not present

## 2022-10-26 DIAGNOSIS — J441 Chronic obstructive pulmonary disease with (acute) exacerbation: Secondary | ICD-10-CM | POA: Diagnosis not present

## 2022-10-26 DIAGNOSIS — F03A0 Unspecified dementia, mild, without behavioral disturbance, psychotic disturbance, mood disturbance, and anxiety: Secondary | ICD-10-CM | POA: Diagnosis not present

## 2022-10-26 NOTE — Progress Notes (Signed)
Carelink Summary Report / Loop Recorder 

## 2022-11-08 ENCOUNTER — Ambulatory Visit (INDEPENDENT_AMBULATORY_CARE_PROVIDER_SITE_OTHER): Payer: Medicare Other

## 2022-11-08 DIAGNOSIS — I639 Cerebral infarction, unspecified: Secondary | ICD-10-CM | POA: Diagnosis not present

## 2022-11-08 LAB — CUP PACEART REMOTE DEVICE CHECK
Date Time Interrogation Session: 20240705230733
Implantable Pulse Generator Implant Date: 20211215

## 2022-11-23 NOTE — Progress Notes (Signed)
Carelink Summary Report / Loop Recorder 

## 2022-12-13 ENCOUNTER — Ambulatory Visit (INDEPENDENT_AMBULATORY_CARE_PROVIDER_SITE_OTHER): Payer: Medicare Other

## 2022-12-13 DIAGNOSIS — I639 Cerebral infarction, unspecified: Secondary | ICD-10-CM

## 2022-12-16 DIAGNOSIS — N189 Chronic kidney disease, unspecified: Secondary | ICD-10-CM | POA: Diagnosis not present

## 2022-12-16 DIAGNOSIS — I129 Hypertensive chronic kidney disease with stage 1 through stage 4 chronic kidney disease, or unspecified chronic kidney disease: Secondary | ICD-10-CM | POA: Diagnosis not present

## 2022-12-16 DIAGNOSIS — I700 Atherosclerosis of aorta: Secondary | ICD-10-CM | POA: Diagnosis not present

## 2022-12-16 DIAGNOSIS — Z043 Encounter for examination and observation following other accident: Secondary | ICD-10-CM | POA: Diagnosis not present

## 2022-12-16 DIAGNOSIS — R402142 Coma scale, eyes open, spontaneous, at arrival to emergency department: Secondary | ICD-10-CM | POA: Diagnosis not present

## 2022-12-16 DIAGNOSIS — M25572 Pain in left ankle and joints of left foot: Secondary | ICD-10-CM | POA: Diagnosis not present

## 2022-12-16 DIAGNOSIS — Z9049 Acquired absence of other specified parts of digestive tract: Secondary | ICD-10-CM | POA: Diagnosis not present

## 2022-12-16 DIAGNOSIS — S199XXA Unspecified injury of neck, initial encounter: Secondary | ICD-10-CM | POA: Diagnosis not present

## 2022-12-16 DIAGNOSIS — S0990XA Unspecified injury of head, initial encounter: Secondary | ICD-10-CM | POA: Diagnosis not present

## 2022-12-16 DIAGNOSIS — M47817 Spondylosis without myelopathy or radiculopathy, lumbosacral region: Secondary | ICD-10-CM | POA: Diagnosis not present

## 2022-12-16 DIAGNOSIS — R402362 Coma scale, best motor response, obeys commands, at arrival to emergency department: Secondary | ICD-10-CM | POA: Diagnosis not present

## 2022-12-16 DIAGNOSIS — S065X0A Traumatic subdural hemorrhage without loss of consciousness, initial encounter: Secondary | ICD-10-CM | POA: Diagnosis not present

## 2022-12-16 DIAGNOSIS — S3981XA Other specified injuries of abdomen, initial encounter: Secondary | ICD-10-CM | POA: Diagnosis not present

## 2022-12-16 DIAGNOSIS — N281 Cyst of kidney, acquired: Secondary | ICD-10-CM | POA: Diagnosis not present

## 2022-12-16 DIAGNOSIS — R402252 Coma scale, best verbal response, oriented, at arrival to emergency department: Secondary | ICD-10-CM | POA: Diagnosis not present

## 2022-12-16 DIAGNOSIS — Z8673 Personal history of transient ischemic attack (TIA), and cerebral infarction without residual deficits: Secondary | ICD-10-CM | POA: Diagnosis not present

## 2022-12-16 DIAGNOSIS — S3992XA Unspecified injury of lower back, initial encounter: Secondary | ICD-10-CM | POA: Diagnosis not present

## 2022-12-16 DIAGNOSIS — S299XXA Unspecified injury of thorax, initial encounter: Secondary | ICD-10-CM | POA: Diagnosis not present

## 2022-12-16 DIAGNOSIS — W010XXA Fall on same level from slipping, tripping and stumbling without subsequent striking against object, initial encounter: Secondary | ICD-10-CM | POA: Diagnosis not present

## 2022-12-16 DIAGNOSIS — S3991XA Unspecified injury of abdomen, initial encounter: Secondary | ICD-10-CM | POA: Diagnosis not present

## 2022-12-16 DIAGNOSIS — M25562 Pain in left knee: Secondary | ICD-10-CM | POA: Diagnosis not present

## 2022-12-16 DIAGNOSIS — S3993XA Unspecified injury of pelvis, initial encounter: Secondary | ICD-10-CM | POA: Diagnosis not present

## 2022-12-16 DIAGNOSIS — Z7901 Long term (current) use of anticoagulants: Secondary | ICD-10-CM | POA: Diagnosis not present

## 2022-12-16 DIAGNOSIS — M5416 Radiculopathy, lumbar region: Secondary | ICD-10-CM | POA: Diagnosis not present

## 2022-12-16 DIAGNOSIS — E785 Hyperlipidemia, unspecified: Secondary | ICD-10-CM | POA: Diagnosis not present

## 2022-12-16 DIAGNOSIS — S065XAA Traumatic subdural hemorrhage with loss of consciousness status unknown, initial encounter: Secondary | ICD-10-CM | POA: Diagnosis not present

## 2022-12-16 DIAGNOSIS — I480 Paroxysmal atrial fibrillation: Secondary | ICD-10-CM | POA: Diagnosis not present

## 2022-12-16 DIAGNOSIS — I6782 Cerebral ischemia: Secondary | ICD-10-CM | POA: Diagnosis not present

## 2022-12-16 DIAGNOSIS — R296 Repeated falls: Secondary | ICD-10-CM | POA: Diagnosis not present

## 2022-12-16 DIAGNOSIS — D6869 Other thrombophilia: Secondary | ICD-10-CM | POA: Diagnosis not present

## 2022-12-16 DIAGNOSIS — M1712 Unilateral primary osteoarthritis, left knee: Secondary | ICD-10-CM | POA: Diagnosis not present

## 2022-12-16 DIAGNOSIS — J449 Chronic obstructive pulmonary disease, unspecified: Secondary | ICD-10-CM | POA: Diagnosis not present

## 2022-12-16 DIAGNOSIS — W19XXXA Unspecified fall, initial encounter: Secondary | ICD-10-CM | POA: Diagnosis not present

## 2022-12-16 DIAGNOSIS — M791 Myalgia, unspecified site: Secondary | ICD-10-CM | POA: Diagnosis not present

## 2022-12-16 DIAGNOSIS — M47812 Spondylosis without myelopathy or radiculopathy, cervical region: Secondary | ICD-10-CM | POA: Diagnosis not present

## 2022-12-17 DIAGNOSIS — N281 Cyst of kidney, acquired: Secondary | ICD-10-CM | POA: Diagnosis not present

## 2022-12-17 DIAGNOSIS — I361 Nonrheumatic tricuspid (valve) insufficiency: Secondary | ICD-10-CM | POA: Diagnosis not present

## 2022-12-17 DIAGNOSIS — Z043 Encounter for examination and observation following other accident: Secondary | ICD-10-CM | POA: Diagnosis not present

## 2022-12-17 DIAGNOSIS — S3991XA Unspecified injury of abdomen, initial encounter: Secondary | ICD-10-CM | POA: Diagnosis not present

## 2022-12-17 DIAGNOSIS — R296 Repeated falls: Secondary | ICD-10-CM | POA: Diagnosis not present

## 2022-12-17 DIAGNOSIS — D6869 Other thrombophilia: Secondary | ICD-10-CM | POA: Diagnosis not present

## 2022-12-17 DIAGNOSIS — I4819 Other persistent atrial fibrillation: Secondary | ICD-10-CM | POA: Diagnosis not present

## 2022-12-17 DIAGNOSIS — Z7901 Long term (current) use of anticoagulants: Secondary | ICD-10-CM | POA: Diagnosis not present

## 2022-12-17 DIAGNOSIS — W010XXA Fall on same level from slipping, tripping and stumbling without subsequent striking against object, initial encounter: Secondary | ICD-10-CM | POA: Diagnosis not present

## 2022-12-17 DIAGNOSIS — R531 Weakness: Secondary | ICD-10-CM | POA: Diagnosis not present

## 2022-12-17 DIAGNOSIS — J449 Chronic obstructive pulmonary disease, unspecified: Secondary | ICD-10-CM | POA: Diagnosis not present

## 2022-12-17 DIAGNOSIS — S06360A Traumatic hemorrhage of cerebrum, unspecified, without loss of consciousness, initial encounter: Secondary | ICD-10-CM | POA: Diagnosis not present

## 2022-12-17 DIAGNOSIS — S06369A Traumatic hemorrhage of cerebrum, unspecified, with loss of consciousness of unspecified duration, initial encounter: Secondary | ICD-10-CM | POA: Diagnosis not present

## 2022-12-17 DIAGNOSIS — R402252 Coma scale, best verbal response, oriented, at arrival to emergency department: Secondary | ICD-10-CM | POA: Diagnosis not present

## 2022-12-17 DIAGNOSIS — S3993XA Unspecified injury of pelvis, initial encounter: Secondary | ICD-10-CM | POA: Diagnosis not present

## 2022-12-17 DIAGNOSIS — R918 Other nonspecific abnormal finding of lung field: Secondary | ICD-10-CM | POA: Diagnosis not present

## 2022-12-17 DIAGNOSIS — R402362 Coma scale, best motor response, obeys commands, at arrival to emergency department: Secondary | ICD-10-CM | POA: Diagnosis not present

## 2022-12-17 DIAGNOSIS — Z9049 Acquired absence of other specified parts of digestive tract: Secondary | ICD-10-CM | POA: Diagnosis not present

## 2022-12-17 DIAGNOSIS — S065XAA Traumatic subdural hemorrhage with loss of consciousness status unknown, initial encounter: Secondary | ICD-10-CM | POA: Diagnosis not present

## 2022-12-17 DIAGNOSIS — S299XXA Unspecified injury of thorax, initial encounter: Secondary | ICD-10-CM | POA: Diagnosis not present

## 2022-12-17 DIAGNOSIS — R402142 Coma scale, eyes open, spontaneous, at arrival to emergency department: Secondary | ICD-10-CM | POA: Diagnosis not present

## 2022-12-17 DIAGNOSIS — S301XXA Contusion of abdominal wall, initial encounter: Secondary | ICD-10-CM | POA: Diagnosis not present

## 2022-12-17 DIAGNOSIS — Z8673 Personal history of transient ischemic attack (TIA), and cerebral infarction without residual deficits: Secondary | ICD-10-CM | POA: Diagnosis not present

## 2022-12-17 DIAGNOSIS — M25572 Pain in left ankle and joints of left foot: Secondary | ICD-10-CM | POA: Diagnosis not present

## 2022-12-17 DIAGNOSIS — I4891 Unspecified atrial fibrillation: Secondary | ICD-10-CM | POA: Diagnosis not present

## 2022-12-17 DIAGNOSIS — I6200 Nontraumatic subdural hemorrhage, unspecified: Secondary | ICD-10-CM | POA: Diagnosis not present

## 2022-12-17 DIAGNOSIS — I4892 Unspecified atrial flutter: Secondary | ICD-10-CM | POA: Diagnosis not present

## 2022-12-17 DIAGNOSIS — S3992XA Unspecified injury of lower back, initial encounter: Secondary | ICD-10-CM | POA: Diagnosis not present

## 2022-12-17 DIAGNOSIS — E785 Hyperlipidemia, unspecified: Secondary | ICD-10-CM | POA: Diagnosis not present

## 2022-12-17 DIAGNOSIS — I480 Paroxysmal atrial fibrillation: Secondary | ICD-10-CM | POA: Diagnosis not present

## 2022-12-17 DIAGNOSIS — I129 Hypertensive chronic kidney disease with stage 1 through stage 4 chronic kidney disease, or unspecified chronic kidney disease: Secondary | ICD-10-CM | POA: Diagnosis not present

## 2022-12-17 DIAGNOSIS — N189 Chronic kidney disease, unspecified: Secondary | ICD-10-CM | POA: Diagnosis not present

## 2022-12-17 DIAGNOSIS — I700 Atherosclerosis of aorta: Secondary | ICD-10-CM | POA: Diagnosis not present

## 2022-12-18 DIAGNOSIS — I4891 Unspecified atrial fibrillation: Secondary | ICD-10-CM | POA: Diagnosis not present

## 2022-12-18 DIAGNOSIS — I480 Paroxysmal atrial fibrillation: Secondary | ICD-10-CM | POA: Diagnosis not present

## 2022-12-18 DIAGNOSIS — I499 Cardiac arrhythmia, unspecified: Secondary | ICD-10-CM | POA: Diagnosis not present

## 2022-12-24 NOTE — Progress Notes (Signed)
Carelink Summary Report / Loop Recorder 

## 2023-01-10 DIAGNOSIS — S065XAA Traumatic subdural hemorrhage with loss of consciousness status unknown, initial encounter: Secondary | ICD-10-CM | POA: Diagnosis not present

## 2023-01-11 DIAGNOSIS — I480 Paroxysmal atrial fibrillation: Secondary | ICD-10-CM | POA: Diagnosis not present

## 2023-01-11 DIAGNOSIS — R269 Unspecified abnormalities of gait and mobility: Secondary | ICD-10-CM | POA: Diagnosis not present

## 2023-01-11 DIAGNOSIS — R400 Somnolence: Secondary | ICD-10-CM | POA: Diagnosis not present

## 2023-01-11 DIAGNOSIS — M48062 Spinal stenosis, lumbar region with neurogenic claudication: Secondary | ICD-10-CM | POA: Diagnosis not present

## 2023-01-11 DIAGNOSIS — R54 Age-related physical debility: Secondary | ICD-10-CM | POA: Diagnosis not present

## 2023-01-11 DIAGNOSIS — Z87828 Personal history of other (healed) physical injury and trauma: Secondary | ICD-10-CM | POA: Diagnosis not present

## 2023-01-11 DIAGNOSIS — I69354 Hemiplegia and hemiparesis following cerebral infarction affecting left non-dominant side: Secondary | ICD-10-CM | POA: Diagnosis not present

## 2023-01-11 DIAGNOSIS — Z9181 History of falling: Secondary | ICD-10-CM | POA: Diagnosis not present

## 2023-01-17 ENCOUNTER — Ambulatory Visit (INDEPENDENT_AMBULATORY_CARE_PROVIDER_SITE_OTHER): Payer: Medicare Other

## 2023-01-17 DIAGNOSIS — I639 Cerebral infarction, unspecified: Secondary | ICD-10-CM | POA: Diagnosis not present

## 2023-01-17 LAB — CUP PACEART REMOTE DEVICE CHECK
Date Time Interrogation Session: 20240913230410
Implantable Pulse Generator Implant Date: 20211215

## 2023-01-18 ENCOUNTER — Encounter (HOSPITAL_COMMUNITY): Payer: Self-pay | Admitting: Physician Assistant

## 2023-01-18 ENCOUNTER — Telehealth: Payer: Self-pay

## 2023-01-18 ENCOUNTER — Ambulatory Visit (HOSPITAL_COMMUNITY)
Admission: RE | Admit: 2023-01-18 | Discharge: 2023-01-18 | Disposition: A | Discharge status: Home / Self Care | Payer: Medicare Other | Source: Ambulatory Visit | Attending: Physician Assistant | Admitting: Physician Assistant

## 2023-01-18 VITALS — BP 114/80 | HR 101 | Ht 62.0 in | Wt 144.6 lb

## 2023-01-18 DIAGNOSIS — Z8673 Personal history of transient ischemic attack (TIA), and cerebral infarction without residual deficits: Secondary | ICD-10-CM | POA: Diagnosis not present

## 2023-01-18 DIAGNOSIS — Z79899 Other long term (current) drug therapy: Secondary | ICD-10-CM | POA: Insufficient documentation

## 2023-01-18 DIAGNOSIS — I10 Essential (primary) hypertension: Secondary | ICD-10-CM | POA: Diagnosis not present

## 2023-01-18 DIAGNOSIS — Z7901 Long term (current) use of anticoagulants: Secondary | ICD-10-CM | POA: Insufficient documentation

## 2023-01-18 DIAGNOSIS — J449 Chronic obstructive pulmonary disease, unspecified: Secondary | ICD-10-CM | POA: Diagnosis not present

## 2023-01-18 DIAGNOSIS — K219 Gastro-esophageal reflux disease without esophagitis: Secondary | ICD-10-CM | POA: Diagnosis not present

## 2023-01-18 DIAGNOSIS — D6869 Other thrombophilia: Secondary | ICD-10-CM | POA: Insufficient documentation

## 2023-01-18 DIAGNOSIS — G4733 Obstructive sleep apnea (adult) (pediatric): Secondary | ICD-10-CM | POA: Insufficient documentation

## 2023-01-18 DIAGNOSIS — I4819 Other persistent atrial fibrillation: Secondary | ICD-10-CM | POA: Diagnosis not present

## 2023-01-18 MED ORDER — METOPROLOL SUCCINATE ER 100 MG PO TB24: ORAL_TABLET | ORAL | 6 refills | Status: DC

## 2023-01-18 NOTE — Progress Notes (Signed)
Primary Care Physician: Si Gaul, DO Primary Cardiologist: Dr Desma Maxim [facility]) Primary Electrophysiologist: Dr Elberta Fortis  Referring Physician: Dr [name] is a 77 y.o. female with a history of HTN, COPD, GERD, CVA, OSA, and atrial fibrillation who presents for follow up in the [facility]ibr[facility]. The patient was admitted on 04/11/2020 with weakness, fatigue, confusion and dehydration. Flu +. She developed transient R facial droop, slurred speech, and aphasia in the hospital. An ILR was placed to monitor for afib. The patient was initially diagnosed with atrial fibrillation 04/29/20 on ILR. Patient was started on Eliquis for a CHADS2VASC score of 6. The device clinic received an alert for ongoing afib with RVR and she underwent DCCV on 05/22/20.  She was admitted on 12/17/2022 for mechanical fall resulting in a small subdural hematoma. She required admission due to sudden onset of atrial fib with rapid ventricular response. Eliquis was discontinued and heart rate was controlled with medications. Her husband reported several falls at home. She was seen by her neurologist 01/11/23 who recommended she remain off Eliquis indefinitely.   On follow up today, her ILR shows persistent afib since July. She does have symptoms of fatigue. She never started diltiazem.   Today, she denies symptoms of palpitations, orthopnea, PND, lower extremity edema, dizziness, presyncope, syncope, bleeding, or neurologic sequela. The patient is tolerating medications without difficulties and is otherwise without complaint today.    Atrial Fibrillation Risk Factors:  she does have symptoms or diagnosis of sleep apnea. She is not compliant with CPAP therapy.  she does not have a history of rheumatic fever.   Atrial Fibrillation Management history:  Previous antiarrhythmic drugs: none Previous cardioversions: 05/22/20, 01/06/21 Previous ablations: none Anticoagulation history:  Eliquis   Past Medical History:  Diagnosis Date   COPD (chronic obstructive pulmonary disease) (HCC)    GERD (gastroesophageal reflux disease)    Hypertension     Current Outpatient Medications  Medication Sig Dispense Refill   albuterol (VENTOLIN HFA) 108 (90 Base) MCG/ACT inhaler SMARTSIG:1 Puff(s) By Mouth 4 Times Daily PRN     amLODipine (NORVASC) 5 MG tablet Take 1 tablet by mouth daily.     aspirin EC 81 MG tablet Take 81 mg by mouth daily. Swallow whole.     B Complex Vitamins (VITAMIN B COMPLEX) TABS Take 1 tablet by mouth daily.     diltiazem (CARDIZEM CD) 180 MG 24 hr capsule Take 1 capsule (180 mg total) by mouth daily. (Patient not taking: Reported on 01/18/2023) 30 capsule 3   gabapentin (NEURONTIN) 100 MG capsule Take 1 capsule (100 mg total) by mouth 3 (three) times daily. 60 capsule 0   lisinopril (ZESTRIL) 20 MG tablet Take 10 mg by mouth daily.     metoprolol tartrate (LOPRESSOR) 100 MG tablet Take 1 tablet (100 mg total) by mouth 2 (two) times daily. 180 tablet 3   pantoprazole (PROTONIX) 40 MG tablet Take 40 mg by mouth daily before breakfast.     pravastatin (PRAVACHOL) 20 MG tablet Take 20 mg by mouth daily.     sertraline (ZOLOFT) 100 MG tablet Take 150 mg by mouth daily.     topiramate (TOPAMAX) 25 MG tablet Take 50 mg by mouth 2 (two) times daily.     ELIQUIS 5 MG TABS tablet TAKE 1 TABLET(5 MG) BY MOUTH TWICE DAILY (Patient not taking: Reported on 01/18/2023) 60 tablet 3   No current facility-administered medications for this encounter.  ROS- All systems are reviewed and negative except as per the HPI above.  Physical Exam: Vitals:   01/18/23 1352  BP: 114/80  Pulse: (!) 101  Weight: 65.6 kg  Height: 5\' 2"  (1.575 m)    GEN: Well nourished, well developed in no acute distress NECK: No JVD; No carotid bruits CARDIAC: Irregularly irregular rate and rhythm, no murmurs, rubs, gallops RESPIRATORY:  Clear to auscultation without rales, wheezing or rhonchi   ABDOMEN: Soft, non-tender, non-distended EXTREMITIES:  No edema; No deformity    Wt Readings from Last 3 Encounters:  01/18/23 65.6 kg  01/11/22 69 kg  11/30/21 67.1 kg    EKG today demonstrates  Afib Vent. rate 101 BPM PR interval * ms QRS duration 68 ms QT/QTcB 336/435 ms   Echo 04/14/20 demonstrated  1. Left ventricular ejection fraction, by estimation, is 65 to 70%. The  left ventricle has normal function. The left ventricle has no regional  wall motion abnormalities. There is mild left ventricular hypertrophy.  Left ventricular diastolic parameters are consistent with Grade II diastolic dysfunction (pseudonormalization).   2. Right ventricular systolic function is normal. The right ventricular  size is normal. Tricuspid regurgitation signal is inadequate for assessing PA pressure.   3. Left atrial size was mildly dilated.   4. Right atrial size was mildly dilated.   5. The mitral valve is normal in structure. Trivial mitral valve  regurgitation. No evidence of mitral stenosis.   6. The aortic valve is tricuspid. Aortic valve regurgitation is not  visualized. Mild aortic valve sclerosis is present, with no evidence of  aortic valve stenosis.   7. The inferior vena cava is normal in size with greater than 50%  respiratory variability, suggesting right atrial pressure of 3 mmHg.   Epic records are reviewed at length today  CHA2DS2-VASc Score = 6  The patient's score is based upon: CHF History: 0 HTN History: 1 Diabetes History: 0 Stroke History: 2 Vascular Disease History: 0 Age Score: 2 Gender Score: 1        ASSESSMENT AND PLAN: Persistent Atrial Fibrillation The patient's CHA2DS2-VASc score is 6, indicating a 9.7% annual risk of stroke.  ILR shows persistent afib since July Can not pursue rhythm control off anticoagulation. Will increase her rate control.  Increase Toprol to 100 mg AM and 50 mg PM  Secondary Hypercoagulable State (ICD10:  D68.69) The  patient is at significant risk for stroke/thromboembolism based upon her CHA2DS2-VASc Score of 6.  Patient is not currently on Eliquis due to her high fall risk and recent SDH. She has had > 4 falls just this year. Her neurologist has recommended remaining off anticoagulation. We discussed Watchman for stroke prevention and she is agreeable to referral to the [place] team. Informational brochure provided today.   HTN Stable on current regimen  OSA Diagnosed in 2017, previously followed by [facility]  Patient not on CPAP   Follow up with Watchman team for evaluation.    Jorja Loa PA-C Afib Clinic [facility] [address] [place], [place] [address] [phone] 01/18/2023 2:04 PM

## 2023-01-18 NOTE — Telephone Encounter (Signed)
Per Jorja Loa, called to arrange [facility] consult.  Left message to call back.

## 2023-01-18 NOTE — Patient Instructions (Signed)
Metoprolol succinate 100mg  (1 tablet) in the morning and 50mg  (1/2 tablet) in the evening

## 2023-01-20 NOTE — Telephone Encounter (Signed)
Left message to call back  

## 2023-01-25 NOTE — Telephone Encounter (Signed)
Left message to call back should she wish to schedule Watchman consult.  Letter sent to the patient as well.

## 2023-01-26 NOTE — Telephone Encounter (Signed)
The patient called back. Scheduled her for [facility] consult with Dr. Jimmey Ralph 02/18/2023. She was grateful for call and agreed with plan.

## 2023-01-31 NOTE — Progress Notes (Signed)
Carelink Summary Report / Loop Recorder 

## 2023-02-17 NOTE — Progress Notes (Addendum)
Electrophysiology Office Note:   Date:  02/18/2023  ID:  Vicki Swanson, DOB 1946/02/04, MRN [id number]  Primary Cardiologist: None Electrophysiologist: Nobie Putnam, MD      History of Present Illness:   Vicki Swanson is a 77 y.o. female with h/o HTN, COPD, GERD, CVA, OSA, subdural and atrial fibrillation who is seen today for evaluation of Watchman device implant at the request of Dr. Elberta Fortis.    Patient was admitted on 12/17/2022 for mechanical fall resulting in a small subdural hematoma.  Her hospital course was complicated by atrial fibrillation with rapid ventricular response.  Eliquis was discontinued at that time.  She was seen by her neurologist on 01/11/2023 who recommended she remain off Eliquis indefinitely. She is taking aspirin 81mg  twice daily. She continues to feel unsteady on her feet and thinks she is a high fall risk, but has not had any true falls since her subdural hematoma.   Review of systems complete and found to be negative unless listed in HPI.   EP Information / Studies Reviewed:    EKG is not ordered today. EKG from 01/18/23 reviewed which showed atrial fibrillation.      Nuclear Stress 01/07/21:  LVEF 69%.  Normal study.  No evidence of ischemia or infarction.  Echo 04/14/20:  LVEF 65 to 70%.  Grade 2 diastolic dysfunction. Normal RV size and function. Mildly dilated left atrium. Mildly dilated right atrium. Aortic valve sclerosis without stenosis.  Risk Assessment/Calculations:    CHA2DS2-VASc Score = 6   This indicates a 9.7% annual risk of stroke. The patient's score is based upon: CHF History: 0 HTN History: 1 Diabetes History: 0 Stroke History: 2 Vascular Disease History: 0 Age Score: 2 Gender Score: 1             Physical Exam:   VS:  BP 112/72   Pulse (!) 105   Ht 5\' 2"  (1.575 m)   Wt 147 lb 12.8 oz (67 kg)   SpO2 97%   BMI 27.03 kg/m    Wt Readings from Last 3 Encounters:  02/18/23 147 lb 12.8 oz (67 kg)  01/18/23 144 lb 9.6  oz (65.6 kg)  01/11/22 152 lb 3.2 oz (69 kg)     GEN: Well nourished, well developed in no acute distress NECK: No JVD; No carotid bruits CARDIAC: Irregularly irregular rate and rhythm, no murmurs, rubs, gallops RESPIRATORY:  Clear to auscultation without rales, wheezing or rhonchi  ABDOMEN: Soft, non-tender, non-distended EXTREMITIES:  No edema; No deformity   ASSESSMENT AND PLAN:   Vicki Swanson is a 77 y.o. female with h/o HTN, COPD, GERD, CVA, OSA, subdural and atrial fibrillation who is seen today for evaluation of Watchman device implant at the request of Dr. Elberta Fortis.   I have seen Vicki Swanson in the office today who is being considered for a Watchman left atrial appendage closure device. I believe they will benefit from this procedure given their history of atrial fibrillation, CHA2DS2-VASc score of 6 and unadjusted ischemic stroke rate of 9.7% per year. Unfortunately, the patient is not felt to be a long term anticoagulation candidate secondary to fall risk and intracranial bleed.   It is my belief that after undergoing a LAA closure procedure, Vicki Swanson will not need long term anticoagulation which eliminates anticoagulation side effects and major bleeding risk.   Procedural risks for the Watchman implant have been reviewed with the patient including a 0.5% risk of stroke, <1% risk of perforation and <  1% risk of device embolization. Other risks include bleeding, vascular damage, tamponade, worsening renal function, and death. The patient understands these risk and wishes to proceed.    The published clinical data on the safety and effectiveness of WATCHMAN include but are not limited to the following: - Holmes DR, Everlene Farrier, Sick P et al. for the PROTECT AF Investigators. Percutaneous closure of the left atrial appendage versus warfarin therapy for prevention of stroke in patients with atrial fibrillation: a randomised non-inferiority trial. Lancet 2009; 374: 534-42. Everlene Farrier, Doshi SK, Isa Rankin D et al. on behalf of the PROTECT AF Investigators. Percutaneous Left Atrial Appendage Closure for Stroke Prophylaxis in Patients With Atrial Fibrillation 2.3-Year Follow-up of the PROTECT AF (Watchman Left Atrial Appendage System for Embolic Protection in Patients With Atrial Fibrillation) Trial. Circulation 2013; 127:720-729. - Alli O, Doshi S,  Kar S, Reddy VY, Sievert H et al. Quality of Life Assessment in the Randomized PROTECT AF (Percutaneous Closure of the Left Atrial Appendage Versus Warfarin Therapy for Prevention of Stroke in Patients With Atrial Fibrillation) Trial of Patients at Risk for Stroke With Nonvalvular Atrial Fibrillation. J Am Coll Cardiol 2013; 61:1790-8. Aline August DR, [place]ia [place], Price M, Whisenant B, Sievert H, Doshi S, Huber K, Reddy V. Prospective randomized evaluation of the Watchman left atrial appendage Device in patients with atrial fibrillation versus long-term warfarin therapy; the PREVAIL trial. Journal of the [employer] of Cardiology, Vol. 4, No. 1, 2014, 1-11. - Kar S, Doshi SK, Sadhu A, Horton R, Osorio J et al. Primary outcome evaluation of a next-generation left atrial appendage closure device: results from the PINNACLE FLX trial. Circulation 2021;143(18)1754-1762.   After today's visit with the patient which was dedicated solely for shared decision making visit regarding LAA closure device, the patient decided to proceed with the LAA appendage closure procedure scheduled to be done in the near future at [facility].  Prior to the procedure, I would like to obtain a gated CT scan of the chest with contrast timed for PV/LA visualization.   Additionally, we will get an echocardiogram to reassess LVEF.   HAS-BLED score 5 Hypertension Yes  Abnormal renal and liver function (Dialysis, transplant, Cr >2.26 mg/dL /Cirrhosis or Bilirubin >2x Normal or AST/ALT/AP >3x Normal) No  Stroke Yes  Bleeding Yes  Labile INR (Unstable/high  INR) No  Elderly (>65) Yes  Drugs or alcohol (>= 8 drinks/week, anti-plt or NSAID) Yes   CHA2DS2-VASc Score = 6  The patient's score is based upon: CHF History: 0 HTN History: 1 Diabetes History: 0 Stroke History: 2 Vascular Disease History: 0 Age Score: 2 Gender Score: 1       ASSESSMENT AND PLAN: Persistent Atrial Fibrillation (ICD10:  I48.19) The patient's CHA2DS2-VASc score is 6, indicating a 9.7% annual risk of stroke.  Unable to initiate rhythm control due to being off oral-anticoagulation. Continue with rate control for now.   Secondary Hypercoagulable State (ICD10:  D68.69) The patient is at significant risk for stroke/thromboembolism based upon her CHA2DS2-VASc Score of 6.  However, the patient is not on anticoagulation due to her high bleeding risk. I spoke with the patient's neurologist, Dr. Stacy Gardner, and she states that the patient would be eligible for short-term anti-coagulation, minimizing duration as much as possible to limit risk. We will get a CT scan for Watchman and if anatomy is feasible for closure then start Eliquis for 3-4 weeks leading up to implant.   Signed,  Nobie Putnam, MD  02/18/2023 9:37 AM

## 2023-02-18 ENCOUNTER — Ambulatory Visit: Payer: Medicare Other | Attending: Cardiology | Admitting: Cardiology

## 2023-02-18 ENCOUNTER — Encounter: Payer: Self-pay | Admitting: Cardiology

## 2023-02-18 VITALS — BP 112/72 | HR 105 | Ht 62.0 in | Wt 147.8 lb

## 2023-02-18 DIAGNOSIS — S065XAA Traumatic subdural hemorrhage with loss of consciousness status unknown, initial encounter: Secondary | ICD-10-CM

## 2023-02-18 DIAGNOSIS — D6869 Other thrombophilia: Secondary | ICD-10-CM | POA: Diagnosis not present

## 2023-02-18 DIAGNOSIS — Z9181 History of falling: Secondary | ICD-10-CM

## 2023-02-18 DIAGNOSIS — I4819 Other persistent atrial fibrillation: Secondary | ICD-10-CM | POA: Diagnosis not present

## 2023-02-18 MED ORDER — METOPROLOL TARTRATE 50 MG PO TABS
50.0000 mg | ORAL_TABLET | Freq: Once | ORAL | 0 refills | Status: DC
Start: 2023-02-18 — End: 2023-08-09

## 2023-02-18 NOTE — Patient Instructions (Addendum)
Medication Instructions:  Your physician recommends that you continue on your current medications as directed. Please refer to the Current Medication list given to you today.  *If you need a refill on your cardiac medications before your next appointment, please call your pharmacy*  Lab Work: TODAY: BMET If you have labs (blood work) drawn today and your tests are completely normal, you will receive your results only by: MyChart Message (if you have MyChart) OR A paper copy in the mail If you have any lab test that is abnormal or we need to change your treatment, we will call you to review the results.  Testing/Procedures: Your physician has requested that you have an echocardiogram. Echocardiography is a painless test that uses sound waves to create images of your heart. It provides your doctor with information about the size and shape of your heart and how well your heart's chambers and valves are working. This procedure takes approximately one hour. There are no restrictions for this procedure. Please do NOT wear cologne, perfume, aftershave, or lotions (deodorant is allowed). Please arrive 15 minutes prior to your appointment time.  Your physician has requested that you have cardiac CT. Cardiac computed tomography (CT) is a painless test that uses an x-ray machine to take clear, detailed pictures of your heart. For further information please visit [email or web address] Please follow instruction sheet as given.  Your physician has requested that you have Left atrial appendage (LAA) closure device implantation is a procedure to put a small device in the LAA of the heart. The LAA is a small sac in the wall of the heart's left upper chamber. Blood clots can form in this area. The device, Watchman closes the LAA to help prevent a blood clot and stroke.   Follow-Up: At [facility], you and your health needs are our priority.  As part of our continuing mission to provide you with  exceptional heart care, we have created designated Provider Care Teams.  These Care Teams include your primary Cardiologist (physician) and Advanced Practice Providers (APPs -  Physician Assistants and Nurse Practitioners) who all work together to provide you with the care you need, when you need it.   Your next appointment:   You will be contacted by Nurse Navigator, Karsten Fells to schedule your pre-procedure visit and procedure date. If you have any questions she can be reached at [phone].

## 2023-02-19 LAB — BASIC METABOLIC PANEL
BUN/Creatinine Ratio: 12 (ref 12–28)
BUN: 15 mg/dL (ref 8–27)
CO2: 22 mmol/L (ref 20–29)
Calcium: 9.3 mg/dL (ref 8.7–10.3)
Chloride: 108 mmol/L — ABNORMAL HIGH (ref 96–106)
Creatinine, Ser: 1.26 mg/dL — ABNORMAL HIGH (ref 0.57–1.00)
Glucose: 81 mg/dL (ref 70–99)
Potassium: 4 mmol/L (ref 3.5–5.2)
Sodium: 144 mmol/L (ref 134–144)
eGFR: 44 mL/min/{1.73_m2} — ABNORMAL LOW (ref >59)

## 2023-02-21 ENCOUNTER — Ambulatory Visit (INDEPENDENT_AMBULATORY_CARE_PROVIDER_SITE_OTHER): Payer: Medicare Other

## 2023-02-21 DIAGNOSIS — I639 Cerebral infarction, unspecified: Secondary | ICD-10-CM

## 2023-02-21 LAB — CUP PACEART REMOTE DEVICE CHECK
Date Time Interrogation Session: 20241020230425
Implantable Pulse Generator Implant Date: 20211215

## 2023-03-08 NOTE — Progress Notes (Signed)
Carelink Summary Report / Loop Recorder 

## 2023-03-10 ENCOUNTER — Telehealth: Payer: Self-pay

## 2023-03-10 NOTE — Telephone Encounter (Signed)
Called patient to schedule pre-Watchman CT.  Left message to call back.

## 2023-03-15 DIAGNOSIS — R413 Other amnesia: Secondary | ICD-10-CM | POA: Diagnosis not present

## 2023-03-15 DIAGNOSIS — Z9181 History of falling: Secondary | ICD-10-CM | POA: Diagnosis not present

## 2023-03-15 DIAGNOSIS — G250 Essential tremor: Secondary | ICD-10-CM | POA: Diagnosis not present

## 2023-03-15 DIAGNOSIS — Z87828 Personal history of other (healed) physical injury and trauma: Secondary | ICD-10-CM | POA: Diagnosis not present

## 2023-03-15 NOTE — Telephone Encounter (Signed)
Left message to call back  

## 2023-03-18 ENCOUNTER — Ambulatory Visit (HOSPITAL_COMMUNITY): Payer: Medicare Other | Attending: Cardiology

## 2023-03-18 DIAGNOSIS — I4819 Other persistent atrial fibrillation: Secondary | ICD-10-CM | POA: Diagnosis not present

## 2023-03-18 LAB — ECHOCARDIOGRAM COMPLETE
Est EF: 50
S' Lateral: 3.1 cm

## 2023-03-23 ENCOUNTER — Encounter: Payer: Self-pay | Admitting: Cardiology

## 2023-03-23 ENCOUNTER — Other Ambulatory Visit: Payer: Self-pay | Admitting: Cardiology

## 2023-03-23 DIAGNOSIS — I4819 Other persistent atrial fibrillation: Secondary | ICD-10-CM

## 2023-03-23 NOTE — Telephone Encounter (Signed)
Patient returned call to scheduled pre-Watchman CT. Patient scheduled for 04/11/2023 at 4pm. Seen by Dr. Jimmey Ralph with BMET 10/18. Instruction letter made and will be mailed to the patient per her request. Pre-certification team notified.   Georgie Chard NP-C Structural Heart Team  Phone: [phone]

## 2023-03-28 ENCOUNTER — Ambulatory Visit (INDEPENDENT_AMBULATORY_CARE_PROVIDER_SITE_OTHER): Payer: Medicare Other

## 2023-03-28 DIAGNOSIS — I639 Cerebral infarction, unspecified: Secondary | ICD-10-CM | POA: Diagnosis not present

## 2023-03-28 LAB — CUP PACEART REMOTE DEVICE CHECK
Date Time Interrogation Session: 20241122230900
Implantable Pulse Generator Implant Date: 20211215

## 2023-04-08 ENCOUNTER — Telehealth (HOSPITAL_COMMUNITY): Payer: Self-pay | Admitting: *Deleted

## 2023-04-08 NOTE — Telephone Encounter (Signed)
Attempted to call patient regarding upcoming cardiac CT appointment. °Left message on voicemail with name and callback number ° °Edie Vallandingham RN Navigator Cardiac Imaging °[facility] °[phone] Office °[phone] Cell ° °

## 2023-04-11 ENCOUNTER — Other Ambulatory Visit: Payer: Self-pay

## 2023-04-11 ENCOUNTER — Encounter (HOSPITAL_COMMUNITY): Payer: Self-pay

## 2023-04-11 ENCOUNTER — Emergency Department (HOSPITAL_COMMUNITY)
Admission: EM | Admit: 2023-04-11 | Discharge: 2023-04-12 | Discharge status: Against Medical Advice | Payer: Medicare Other | Attending: Emergency Medicine | Admitting: Emergency Medicine

## 2023-04-11 ENCOUNTER — Ambulatory Visit (HOSPITAL_COMMUNITY)
Admission: RE | Admit: 2023-04-11 | Discharge: 2023-04-11 | Disposition: A | Discharge status: Home / Self Care | Payer: Medicare Other | Source: Ambulatory Visit | Attending: Cardiology | Admitting: Cardiology

## 2023-04-11 ENCOUNTER — Emergency Department (HOSPITAL_COMMUNITY): Payer: Medicare Other

## 2023-04-11 DIAGNOSIS — I4819 Other persistent atrial fibrillation: Secondary | ICD-10-CM

## 2023-04-11 DIAGNOSIS — R000 Tachycardia, unspecified: Secondary | ICD-10-CM | POA: Insufficient documentation

## 2023-04-11 DIAGNOSIS — R918 Other nonspecific abnormal finding of lung field: Secondary | ICD-10-CM | POA: Diagnosis not present

## 2023-04-11 DIAGNOSIS — R079 Chest pain, unspecified: Secondary | ICD-10-CM | POA: Diagnosis not present

## 2023-04-11 DIAGNOSIS — Z5321 Procedure and treatment not carried out due to patient leaving prior to being seen by health care provider: Secondary | ICD-10-CM | POA: Insufficient documentation

## 2023-04-11 LAB — CBC
HCT: 42.1 % (ref 36.0–46.0)
Hemoglobin: 14 g/dL (ref 12.0–15.0)
MCH: 32.7 pg (ref 26.0–34.0)
MCHC: 33.3 g/dL (ref 30.0–36.0)
MCV: 98.4 fL (ref 80.0–100.0)
Platelets: 171 10*3/uL (ref 150–400)
RBC: 4.28 MIL/uL (ref 3.87–5.11)
RDW: 13.6 % (ref 11.5–15.5)
WBC: 8.6 10*3/uL (ref 4.0–10.5)
nRBC: 0 % (ref 0.0–0.2)

## 2023-04-11 LAB — BASIC METABOLIC PANEL
Anion gap: 11 (ref 5–15)
BUN: 18 mg/dL (ref 8–23)
CO2: 19 mmol/L — ABNORMAL LOW (ref 22–32)
Calcium: 9.2 mg/dL (ref 8.9–10.3)
Chloride: 110 mmol/L (ref 98–111)
Creatinine, Ser: 1.46 mg/dL — ABNORMAL HIGH (ref 0.44–1.00)
GFR, Estimated: 37 mL/min — ABNORMAL LOW (ref >60)
Glucose, Bld: 94 mg/dL (ref 70–99)
Potassium: 4.1 mmol/L (ref 3.5–5.1)
Sodium: 140 mmol/L (ref 135–145)

## 2023-04-11 LAB — TROPONIN I (HIGH SENSITIVITY)
Troponin I (High Sensitivity): 4 ng/L (ref <18)
Troponin I (High Sensitivity): 5 ng/L (ref <18)

## 2023-04-11 NOTE — Progress Notes (Signed)
Patient ID: Vicki Swanson, female   DOB: 23-Jan-1946, 77 y.o.   MRN: [id number] pt feels tired "I just feel bad" see vital signs Dr.Chandrasekhar called pt heart rate 120-164. Per DR. Pt needs to be evualted in the emergency room. Pt agrees. Pt taken to emergency room.

## 2023-04-11 NOTE — ED Provider Triage Note (Signed)
Emergency Medicine Provider Triage Evaluation Note  Vicki Swanson , a 77 y.o. female  was evaluated in triage.  Pt complains of tachycardia. Was receiving CT scan in prep for watchmen procedure. Was found to be tachycardic, 162. Was sent here. No cp, SOB, and palpitations. Stopped eliquis d/t brain bleed about 2 months ago per patient. Does take metoprolol  Review of Systems  Positive: See above Negative: See above  Physical Exam  BP 99/74   Pulse 98   Temp 97.9 F (36.6 C) (Oral)   Resp 16   Ht 5\' 2"  (1.575 m)   Wt 66.2 kg   SpO2 97%   BMI 26.70 kg/m  Gen:   Awake, no distress   Resp:  Normal effort  MSK:   Moves extremities without difficulty  Other:    Medical Decision Making  Medically screening exam initiated at 4:11 PM.  Appropriate orders placed.  Sofie Rower was informed that the remainder of the evaluation will be completed by another provider, this initial triage assessment does not replace that evaluation, and the importance of remaining in the ED until their evaluation is complete.  Work up started   Gareth Eagle, [place] 04/11/23 [phone]

## 2023-04-11 NOTE — ED Triage Notes (Signed)
Pt was suppose to receive a scan for the watchman procedure. Pt was informed that her HR was 162 and needed to come to ER to be looked at. Pt has been here since 14:00. Pt denies N/V, SOB, and pain at this time.

## 2023-04-11 NOTE — ED Notes (Signed)
EKG was not completed, clicked complete for wrong room.

## 2023-04-12 NOTE — ED Notes (Signed)
Pt decided to leave while waiting for a room.  

## 2023-04-13 ENCOUNTER — Telehealth: Payer: Self-pay

## 2023-04-13 NOTE — Telephone Encounter (Signed)
The patient's 12/9 pre-LAAO CT was cancelled upon arrival because she was in afib with RVR.  Dr. Izora Ribas advised her to proceed to the ER where she then left while waiting for a room.  Today, the patient reports she doesn't feel well. Her biggest complaint is fatigue.  She denies CP, SOB, racing HR.  When asked if she has been keeping a BP/HR log, she responded that she does but did not know any readings to report.  Scheduled her for follow-up in the AFib Clinic 12/16 for check-up. She understood her CT will be rescheduled once her afib is controlled. ER precautions reviewed. She was grateful for call and agreed with plan.

## 2023-04-18 ENCOUNTER — Encounter (HOSPITAL_COMMUNITY): Payer: Self-pay | Admitting: Physician Assistant

## 2023-04-18 ENCOUNTER — Ambulatory Visit (HOSPITAL_COMMUNITY)
Admission: RE | Admit: 2023-04-18 | Discharge: 2023-04-18 | Disposition: A | Discharge status: Home / Self Care | Payer: Medicare Other | Source: Ambulatory Visit | Attending: Physician Assistant | Admitting: Physician Assistant

## 2023-04-18 VITALS — BP 126/94 | HR 105 | Ht 62.0 in | Wt 148.6 lb

## 2023-04-18 DIAGNOSIS — I4819 Other persistent atrial fibrillation: Secondary | ICD-10-CM | POA: Insufficient documentation

## 2023-04-18 DIAGNOSIS — D6869 Other thrombophilia: Secondary | ICD-10-CM | POA: Insufficient documentation

## 2023-04-18 DIAGNOSIS — I4891 Unspecified atrial fibrillation: Secondary | ICD-10-CM | POA: Diagnosis not present

## 2023-04-18 DIAGNOSIS — Z9181 History of falling: Secondary | ICD-10-CM | POA: Diagnosis not present

## 2023-04-18 DIAGNOSIS — K219 Gastro-esophageal reflux disease without esophagitis: Secondary | ICD-10-CM | POA: Diagnosis not present

## 2023-04-18 DIAGNOSIS — Z8673 Personal history of transient ischemic attack (TIA), and cerebral infarction without residual deficits: Secondary | ICD-10-CM | POA: Insufficient documentation

## 2023-04-18 DIAGNOSIS — G4733 Obstructive sleep apnea (adult) (pediatric): Secondary | ICD-10-CM | POA: Diagnosis not present

## 2023-04-18 DIAGNOSIS — J449 Chronic obstructive pulmonary disease, unspecified: Secondary | ICD-10-CM | POA: Diagnosis not present

## 2023-04-18 DIAGNOSIS — R9431 Abnormal electrocardiogram [ECG] [EKG]: Secondary | ICD-10-CM | POA: Diagnosis not present

## 2023-04-18 DIAGNOSIS — I10 Essential (primary) hypertension: Secondary | ICD-10-CM | POA: Insufficient documentation

## 2023-04-18 NOTE — Progress Notes (Signed)
Primary Care Physician: Si Gaul, DO Primary Cardiologist: Dr Desma Maxim [facility]) Primary Electrophysiologist: Dr Elberta Fortis  Referring Physician: Dr [name] is a 77 y.o. female with a history of HTN, COPD, GERD, CVA, OSA, and atrial fibrillation who presents for follow up in the [facility]. The patient was admitted on 04/11/2020 with weakness, fatigue, confusion and dehydration. Flu +. She developed transient R facial droop, slurred speech, and aphasia in the hospital. An ILR was placed to monitor for afib. The patient was initially diagnosed with atrial fibrillation 04/29/20 on ILR. Patient was started on Eliquis for a CHADS2VASC score of 6. The device clinic received an alert for ongoing afib with RVR and she underwent DCCV on 05/22/20.  She was admitted on 12/17/2022 for mechanical fall resulting in a small subdural hematoma. She required admission due to sudden onset of atrial fib with rapid ventricular response. Eliquis was discontinued and heart rate was controlled with medications. Her husband reported several falls at home. She was seen by her neurologist 01/11/23 who recommended she remain off Eliquis indefinitely.   ILR shows 100% afib burden since 11/2022. She has seen Dr Jimmey Ralph and Watchman implant was recommended. She presented for CT scan on 04/11/23 and was found to be in afib with HR as high as 160s bpm. She was sent to the ED but left before being evaluated.   On follow up today, patient remains in afib with symptoms of afib. Her heart rates are better controlled. Patient and her husband have some confusion about her medications. Husband reports that patient has not been taking PM dose of metoprolol.   Today, she denies symptoms of palpitations, orthopnea, PND, lower extremity edema, dizziness, presyncope, syncope, bleeding, or neurologic sequela. The patient is tolerating medications without difficulties and is otherwise without complaint  today.    Atrial Fibrillation Risk Factors:  she does have symptoms or diagnosis of sleep apnea. she does not have a history of rheumatic fever.   Atrial Fibrillation Management history:  Previous antiarrhythmic drugs: none Previous cardioversions: 05/22/20, 01/06/21 Previous ablations: none Anticoagulation history: Eliquis   Past Medical History:  Diagnosis Date   COPD (chronic obstructive pulmonary disease) (HCC)    GERD (gastroesophageal reflux disease)    Hypertension     Current Outpatient Medications  Medication Sig Dispense Refill   albuterol (VENTOLIN HFA) 108 (90 Base) MCG/ACT inhaler SMARTSIG:1 Puff(s) By Mouth 4 Times Daily PRN     amLODipine (NORVASC) 5 MG tablet Take 1 tablet by mouth daily.     aspirin EC 81 MG tablet Take 81 mg by mouth daily. Swallow whole.     B Complex Vitamins (VITAMIN B COMPLEX) TABS Take 1 tablet by mouth as needed.     gabapentin (NEURONTIN) 100 MG capsule Take 1 capsule (100 mg total) by mouth 3 (three) times daily. 60 capsule 0   lisinopril (ZESTRIL) 20 MG tablet Take 20 mg by mouth daily.     metoprolol succinate (TOPROL XL) 100 MG 24 hr tablet Take 1 tablet in the AM and 1/2 tablet in the PM 45 tablet 6   pantoprazole (PROTONIX) 40 MG tablet Take 40 mg by mouth daily before breakfast.     pravastatin (PRAVACHOL) 20 MG tablet Take 20 mg by mouth daily.     sertraline (ZOLOFT) 100 MG tablet Take 150 mg by mouth daily.     topiramate (TOPAMAX) 25 MG tablet Take 50 mg by mouth 2 (two) times daily.  metoprolol tartrate (LOPRESSOR) 50 MG tablet Take 1 tablet (50 mg total) by mouth once for 1 dose. Take 1 tablet, 2 hours prior to your CT scan 1 tablet 0   No current facility-administered medications for this encounter.    ROS- All systems are reviewed and negative except as per the HPI above.  Physical Exam: Vitals:   04/18/23 1315  BP: (!) 126/94  Pulse: (!) 105  Weight: 67.4 kg  Height: 5\' 2"  (1.575 m)     GEN: Well  nourished, well developed in no acute distress NECK: No JVD; No carotid bruits CARDIAC: Irregularly irregular rate and rhythm, no murmurs, rubs, gallops RESPIRATORY:  Clear to auscultation without rales, wheezing or rhonchi  ABDOMEN: Soft, non-tender, non-distended EXTREMITIES:  No edema; No deformity    Wt Readings from Last 3 Encounters:  04/18/23 67.4 kg  04/11/23 66.2 kg  02/18/23 67 kg    EKG today demonstrates  Afib Vent. rate 105 BPM PR interval * ms QRS duration 66 ms QT/QTcB 342/452 ms   Echo 03/18/23 demonstrated  1. Left ventricular ejection fraction, by estimation, is 50%. The left  ventricle has mildly decreased function. The left ventricle demonstrates  global hypokinesis. Left ventricular diastolic parameters are  indeterminate.   2. Right ventricular systolic function is mildly reduced. The right  ventricular size is normal. There is normal pulmonary artery systolic  pressure. The estimated right ventricular systolic pressure is 33.9 mmHg.   3. Left atrial size was mildly dilated.   4. Right atrial size was mildly dilated.   5. The mitral valve is normal in structure. No evidence of mitral valve  regurgitation. No evidence of mitral stenosis.   6. Tricuspid valve regurgitation is mild to moderate.   7. The aortic valve is tricuspid. There is mild calcification of the  aortic valve. Aortic valve regurgitation is not visualized. No aortic  stenosis is present.   8. The inferior vena cava is normal in size with greater than 50%  respiratory variability, suggesting right atrial pressure of 3 mmHg.   9. The patient was in atrial fibrillation.    Epic records are reviewed at length today  CHA2DS2-VASc Score = 6  The patient's score is based upon: CHF History: 0 HTN History: 1 Diabetes History: 0 Stroke History: 2 Vascular Disease History: 0 Age Score: 2 Gender Score: 1        ASSESSMENT AND PLAN: Persistent Atrial Fibrillation The patient's  CHA2DS2-VASc score is 6, indicating a 9.7% annual risk of stroke.  ILR shows 100% afib burden since July 2024 Can not pursue rhythm control off anticoagulation.  Patient and husband report that she has been taking only 100 mg of Toprol instead of 150 mg.  Will increase Toprol to 100 mg AM and 50 mg PM and recheck in 2 weeks. Once she is adequately rate controlled, can get CT arranged for Watchman. Their son who accompanies them today will   Secondary Hypercoagulable State (ICD10:  [phone]) The patient is at significant risk for stroke/thromboembolism based upon her CHA2DS2-VASc Score of 6.  Patient is not currently on Eliquis due to her high fall risk and previous SDH. Her neurologist has recommended remaining off anticoagulation long term but is eligible to be on short term for Watchman. Patient seen by Dr Jimmey Ralph and workup is pending for implant.   HTN Stable on current regimen  OSA Diagnosed in 2017, previously followed by [facility]  Patient not on CPAP   Follow up in the  AF clinic in 2-3 weeks to reassess rate control.    Jorja Loa PA-C Afib Clinic [facility] [address] [place], [place] [address] [phone] 04/18/2023 1:59 PM

## 2023-04-20 DIAGNOSIS — K080 Exfoliation of teeth due to systemic causes: Secondary | ICD-10-CM | POA: Diagnosis not present

## 2023-04-21 NOTE — Progress Notes (Signed)
Carelink Summary Report / Loop Recorder 

## 2023-04-28 ENCOUNTER — Telehealth: Payer: Self-pay

## 2023-04-28 MED ORDER — METOPROLOL SUCCINATE ER 100 MG PO TB24: 100.0000 mg | ORAL_TABLET | Freq: Two times a day (BID) | ORAL | 3 refills | Status: AC

## 2023-04-28 NOTE — Telephone Encounter (Signed)
Discussed with afib clinic.  Will have Pt increase Toprol XL 100 mg-  Take one tablet in the AM and one tablet in the PM.  Outreach made to Pt's husband.  Advise of med changes.  Med change sent to pharmacy.  Follow up scheduled.

## 2023-04-28 NOTE — Telephone Encounter (Signed)
Alert received from [employer]:  Alert remote transmission: AF HRs are elevated with AF, see trends.  rates are 100-140 bpm.  No OAC but being worked up for [employer]. Sent triage for AF with RVR

## 2023-05-02 ENCOUNTER — Ambulatory Visit (INDEPENDENT_AMBULATORY_CARE_PROVIDER_SITE_OTHER): Payer: Medicare Other

## 2023-05-02 DIAGNOSIS — I639 Cerebral infarction, unspecified: Secondary | ICD-10-CM | POA: Diagnosis not present

## 2023-05-02 LAB — CUP PACEART REMOTE DEVICE CHECK
Date Time Interrogation Session: 20241227230425
Implantable Pulse Generator Implant Date: 20211215

## 2023-05-06 ENCOUNTER — Ambulatory Visit (HOSPITAL_COMMUNITY)
Admission: RE | Admit: 2023-05-06 | Discharge: 2023-05-06 | Disposition: A | Discharge status: Home / Self Care | Payer: Medicare Other | Source: Ambulatory Visit | Attending: Physician Assistant | Admitting: Physician Assistant

## 2023-05-06 ENCOUNTER — Telehealth: Payer: Self-pay

## 2023-05-06 VITALS — BP 132/80 | HR 89 | Ht 62.0 in | Wt 146.0 lb

## 2023-05-06 DIAGNOSIS — Z8673 Personal history of transient ischemic attack (TIA), and cerebral infarction without residual deficits: Secondary | ICD-10-CM | POA: Diagnosis not present

## 2023-05-06 DIAGNOSIS — G4733 Obstructive sleep apnea (adult) (pediatric): Secondary | ICD-10-CM | POA: Diagnosis not present

## 2023-05-06 DIAGNOSIS — K219 Gastro-esophageal reflux disease without esophagitis: Secondary | ICD-10-CM | POA: Insufficient documentation

## 2023-05-06 DIAGNOSIS — D6869 Other thrombophilia: Secondary | ICD-10-CM | POA: Insufficient documentation

## 2023-05-06 DIAGNOSIS — Z79899 Other long term (current) drug therapy: Secondary | ICD-10-CM | POA: Diagnosis not present

## 2023-05-06 DIAGNOSIS — I4819 Other persistent atrial fibrillation: Secondary | ICD-10-CM | POA: Diagnosis not present

## 2023-05-06 DIAGNOSIS — J449 Chronic obstructive pulmonary disease, unspecified: Secondary | ICD-10-CM | POA: Insufficient documentation

## 2023-05-06 DIAGNOSIS — I10 Essential (primary) hypertension: Secondary | ICD-10-CM | POA: Diagnosis not present

## 2023-05-06 NOTE — Progress Notes (Signed)
 Primary Care Physician: Signa Randall HERO, DO Primary Cardiologist: Dr McGukin [facility]) Primary Electrophysiologist: Dr Inocencio  Referring Physician: Dr Inocencio Vicki Swanson is a 78 y.o. female with a history of HTN, COPD, GERD, CVA, OSA, and atrial fibrillation who presents for follow up in the [facility] Fibrillation [facility]. The patient was admitted on 04/11/2020 with weakness, fatigue, confusion and dehydration. Flu +. She developed transient R facial droop, slurred speech, and aphasia in the hospital. An ILR was placed to monitor for afib. The patient was initially diagnosed with atrial fibrillation 04/29/20 on ILR. Patient was started on Eliquis  for a CHADS2VASC score of 6. The device clinic received an alert for ongoing afib with RVR and she underwent DCCV on 05/22/20.  She was admitted on 12/17/2022 for mechanical fall resulting in a small subdural hematoma. She required admission due to sudden onset of atrial fib with rapid ventricular response. Eliquis  was discontinued and heart rate was controlled with medications. Her husband reported several falls at home. She was seen by her neurologist 01/11/23 who recommended she remain off Eliquis  indefinitely.   ILR shows 100% afib burden since 11/2022. She has seen Dr Kennyth and Watchman implant was recommended. She presented for CT scan on 04/11/23 and was found to be in afib with HR as high as 160s bpm. She was sent to the ED but left before being evaluated.   On follow up today, patient has been taking metoprolol  100 mg BID and her heart rates have been better controlled. Her fatigue is unchanged. She has been using a wheelchair when leaving the house.   Today, she denies symptoms of palpitations, orthopnea, PND, lower extremity edema, dizziness, presyncope, syncope, bleeding, or neurologic sequela. The patient is tolerating medications without difficulties and is otherwise without complaint today.    Atrial Fibrillation Risk  Factors:  she does have symptoms or diagnosis of sleep apnea. she does not have a history of rheumatic fever.   Atrial Fibrillation Management history:  Previous antiarrhythmic drugs: none Previous cardioversions: 05/22/20, 01/06/21 Previous ablations: none Anticoagulation history: Eliquis    Past Medical History:  Diagnosis Date   COPD (chronic obstructive pulmonary disease) (HCC)    GERD (gastroesophageal reflux disease)    Hypertension     Current Outpatient Medications  Medication Sig Dispense Refill   albuterol  (VENTOLIN  HFA) 108 (90 Base) MCG/ACT inhaler SMARTSIG:1 Puff(s) By Mouth 4 Times Daily PRN     amLODipine  (NORVASC ) 5 MG tablet Take 1 tablet by mouth daily.     aspirin  EC 81 MG tablet Take 81 mg by mouth daily. Swallow whole.     B Complex Vitamins (VITAMIN B COMPLEX) TABS Take 1 tablet by mouth as needed.     gabapentin  (NEURONTIN ) 100 MG capsule Take 1 capsule (100 mg total) by mouth 3 (three) times daily. 60 capsule 0   lisinopril  (ZESTRIL ) 20 MG tablet Take 20 mg by mouth daily.     metoprolol  succinate (TOPROL -XL) 100 MG 24 hr tablet Take 1 tablet (100 mg total) by mouth in the morning and at bedtime. Take with or immediately following a meal. 180 tablet 3   metoprolol  tartrate (LOPRESSOR ) 50 MG tablet Take 1 tablet (50 mg total) by mouth once for 1 dose. Take 1 tablet, 2 hours prior to your CT scan 1 tablet 0   pantoprazole  (PROTONIX ) 40 MG tablet Take 40 mg by mouth daily before breakfast.     pravastatin (PRAVACHOL) 20 MG tablet Take 20 mg by  mouth daily.     sertraline  (ZOLOFT ) 100 MG tablet Take 150 mg by mouth daily.     topiramate  (TOPAMAX ) 25 MG tablet Take 50 mg by mouth 2 (two) times daily.     No current facility-administered medications for this encounter.    ROS- All systems are reviewed and negative except as per the HPI above.  Physical Exam: Vitals:   05/06/23 0905  BP: 132/80  Pulse: 89  Weight: 66.2 kg  Height: 5' 2 (1.575 m)    GEN:  Well nourished, well developed in no acute distress NECK: No JVD; No carotid bruits CARDIAC: Irregularly irregular rate and rhythm, no murmurs, rubs, gallops RESPIRATORY:  Clear to auscultation without rales, wheezing or rhonchi  ABDOMEN: Soft, non-tender, non-distended EXTREMITIES:  No edema; No deformity    Wt Readings from Last 3 Encounters:  05/06/23 66.2 kg  04/18/23 67.4 kg  04/11/23 66.2 kg    EKG today demonstrates  Afib Vent. rate 89 BPM PR interval * ms QRS duration 64 ms QT/QTcB 354/430 ms   Echo 03/18/23 demonstrated  1. Left ventricular ejection fraction, by estimation, is 50%. The left  ventricle has mildly decreased function. The left ventricle demonstrates  global hypokinesis. Left ventricular diastolic parameters are  indeterminate.   2. Right ventricular systolic function is mildly reduced. The right  ventricular size is normal. There is normal pulmonary artery systolic  pressure. The estimated right ventricular systolic pressure is 33.9 mmHg.   3. Left atrial size was mildly dilated.   4. Right atrial size was mildly dilated.   5. The mitral valve is normal in structure. No evidence of mitral valve  regurgitation. No evidence of mitral stenosis.   6. Tricuspid valve regurgitation is mild to moderate.   7. The aortic valve is tricuspid. There is mild calcification of the  aortic valve. Aortic valve regurgitation is not visualized. No aortic  stenosis is present.   8. The inferior vena cava is normal in size with greater than 50%  respiratory variability, suggesting right atrial pressure of 3 mmHg.   9. The patient was in atrial fibrillation.    Epic records are reviewed at length today  CHA2DS2-VASc Score = 6  The patient's score is based upon: CHF History: 0 HTN History: 1 Diabetes History: 0 Stroke History: 2 Vascular Disease History: 0 Age Score: 2 Gender Score: 1        ASSESSMENT AND PLAN: Persistent Atrial Fibrillation The patient's  CHA2DS2-VASc score is 6, indicating a 9.7% annual risk of stroke.  ILR shows 100% afib burden since July 2024 Heart rates improved on higher dose of BB. Will reach out to [facility] team to get CT scan rescheduled.  Continue Toprol  100 mg BID  Secondary Hypercoagulable State (ICD10:  D68.69) The patient is at significant risk for stroke/thromboembolism based upon her CHA2DS2-VASc Score of 6.  Patient is not currently on Eliquis  due to her high fall risk and previous SDH. Her neurologist has recommended remaining off anticoagulation long term but is eligible to be on short term for Watchman. Patient seen by Dr Kennyth and workup is pending for implant.   HTN Stable on current regimen  OSA Diagnosed in 2017, previously followed by [facility]  Patient not on CPAP, does not want to restart   Follow up with Watchman team for continued workup.    Daril Kicks PA-C Afib Clinic [facility] [address] [place], [place] [address] [phone] 05/06/2023 9:37 AM

## 2023-05-06 NOTE — Telephone Encounter (Signed)
 Per Jorja Loa, OK to reschedule Watchman CT.  Called to arrange and left message to call back.

## 2023-05-10 DIAGNOSIS — D6869 Other thrombophilia: Secondary | ICD-10-CM | POA: Diagnosis not present

## 2023-05-10 DIAGNOSIS — R9389 Abnormal findings on diagnostic imaging of other specified body structures: Secondary | ICD-10-CM | POA: Diagnosis not present

## 2023-05-10 DIAGNOSIS — J449 Chronic obstructive pulmonary disease, unspecified: Secondary | ICD-10-CM | POA: Diagnosis not present

## 2023-05-10 DIAGNOSIS — I4891 Unspecified atrial fibrillation: Secondary | ICD-10-CM | POA: Diagnosis not present

## 2023-05-10 DIAGNOSIS — I69354 Hemiplegia and hemiparesis following cerebral infarction affecting left non-dominant side: Secondary | ICD-10-CM | POA: Diagnosis not present

## 2023-05-10 DIAGNOSIS — R400 Somnolence: Secondary | ICD-10-CM | POA: Diagnosis not present

## 2023-05-10 DIAGNOSIS — R0789 Other chest pain: Secondary | ICD-10-CM | POA: Diagnosis not present

## 2023-05-10 DIAGNOSIS — F3341 Major depressive disorder, recurrent, in partial remission: Secondary | ICD-10-CM | POA: Diagnosis not present

## 2023-05-10 DIAGNOSIS — S065XAA Traumatic subdural hemorrhage with loss of consciousness status unknown, initial encounter: Secondary | ICD-10-CM | POA: Diagnosis not present

## 2023-05-10 DIAGNOSIS — D751 Secondary polycythemia: Secondary | ICD-10-CM | POA: Diagnosis not present

## 2023-05-10 NOTE — Telephone Encounter (Signed)
 Attempted second call to patient to schedule pre-LAAO CT.

## 2023-05-20 NOTE — Telephone Encounter (Signed)
The patient is scheduled for pre-Watchman CT on 1/28 at 1100.

## 2023-05-30 ENCOUNTER — Telehealth (HOSPITAL_COMMUNITY): Payer: Self-pay | Admitting: *Deleted

## 2023-05-30 NOTE — Telephone Encounter (Signed)
Attempted to call patient regarding upcoming cardiac CT appointment. Left message on voicemail with name and callback number  Larey Brick RN Navigator Cardiac Imaging [facility] [phone] Office [phone] Cell

## 2023-05-31 ENCOUNTER — Ambulatory Visit (HOSPITAL_COMMUNITY)
Admission: RE | Admit: 2023-05-31 | Discharge: 2023-05-31 | Disposition: A | Discharge status: Home / Self Care | Payer: Medicare Other | Source: Ambulatory Visit | Attending: Cardiology | Admitting: Cardiology

## 2023-05-31 DIAGNOSIS — I4819 Other persistent atrial fibrillation: Secondary | ICD-10-CM

## 2023-05-31 LAB — POCT I-STAT CREATININE: Creatinine, Ser: 1.2 mg/dL — ABNORMAL HIGH (ref 0.44–1.00)

## 2023-05-31 MED ORDER — METOPROLOL TARTRATE 5 MG/5ML IV SOLN
5.0000 mg | Freq: Once | INTRAVENOUS | Status: AC
  Administered 2023-05-31: 5 mg via INTRAVENOUS

## 2023-05-31 MED ORDER — METOPROLOL TARTRATE 5 MG/5ML IV SOLN
INTRAVENOUS | Status: AC
Start: 2023-05-31 — End: ?
  Filled 2023-05-31: qty 5

## 2023-05-31 MED ORDER — DILTIAZEM HCL 25 MG/5ML IV SOLN
INTRAVENOUS | Status: AC
Start: 2023-05-31 — End: ?
  Filled 2023-05-31: qty 5

## 2023-05-31 MED ORDER — IOHEXOL 350 MG/ML SOLN
95.0000 mL | Freq: Once | INTRAVENOUS | Status: AC | PRN
  Administered 2023-05-31: 95 mL via INTRAVENOUS

## 2023-05-31 MED ORDER — DILTIAZEM HCL 25 MG/5ML IV SOLN
5.0000 mg | Freq: Once | INTRAVENOUS | Status: AC
  Administered 2023-05-31: 5 mg via INTRAVENOUS

## 2023-06-06 ENCOUNTER — Ambulatory Visit (INDEPENDENT_AMBULATORY_CARE_PROVIDER_SITE_OTHER): Payer: Medicare Other

## 2023-06-06 DIAGNOSIS — I639 Cerebral infarction, unspecified: Secondary | ICD-10-CM

## 2023-06-06 LAB — CUP PACEART REMOTE DEVICE CHECK
Date Time Interrogation Session: 20250202230444
Implantable Pulse Generator Implant Date: 20211215

## 2023-06-07 ENCOUNTER — Telehealth: Payer: Self-pay

## 2023-06-07 NOTE — Telephone Encounter (Signed)
 Marland Kitchen

## 2023-06-08 ENCOUNTER — Telehealth: Payer: Self-pay

## 2023-06-08 NOTE — Telephone Encounter (Signed)
-----   Message from Fonda Kitty sent at 06/04/2023 10:07 PM EST ----- Okay to proceed with scheduling. Will need Eliquis  2.5mg  BID starting 4 weeks prior to implant. ----- Message ----- From: Interface, Rad Results In Sent: 05/31/2023   4:47 PM EST To: Fonda Kitty, MD

## 2023-06-08 NOTE — Telephone Encounter (Signed)
 Called to discuss Watchman scheduling.  Left message to call back. Will offer LAAO date of 07/14/2023. The patient will need to start Eliquis  2.5 mg BID 4 weeks prior to procedure.

## 2023-06-09 ENCOUNTER — Telehealth: Payer: Self-pay | Admitting: Cardiology

## 2023-06-09 NOTE — Telephone Encounter (Signed)
 Slater from [facility] with over read  Stable 4 mm semi-solid pulmonary nodule within the right lower lobe, with a stable 3 mm solid, noncalcified pulmonary nodule within the left upper lobe. No follow-up needed if patient is low-risk (and has no known or suspected primary neoplasm). Non-contrast chest CT can be considered in 12 months if patient is high-risk. This recommendation follows the consensus statement

## 2023-06-09 NOTE — Telephone Encounter (Signed)
 Calling with abn results

## 2023-06-16 NOTE — Telephone Encounter (Signed)
Left message to call back.   The patient is now not a candidate for procedure date of 3/13 as she has to start Eliquis 4 weeks prior. Will now offer 4/17.

## 2023-06-23 NOTE — Telephone Encounter (Signed)
 Left message to call back

## 2023-06-27 NOTE — Telephone Encounter (Signed)
 Left message to call back

## 2023-07-05 NOTE — Telephone Encounter (Signed)
 Left message to call back

## 2023-07-11 ENCOUNTER — Ambulatory Visit: Payer: Medicare Other

## 2023-07-11 DIAGNOSIS — I639 Cerebral infarction, unspecified: Secondary | ICD-10-CM

## 2023-07-11 LAB — CUP PACEART REMOTE DEVICE CHECK
Date Time Interrogation Session: 20250309230449
Implantable Pulse Generator Implant Date: 20211215

## 2023-07-12 NOTE — Telephone Encounter (Signed)
 Left message to call back

## 2023-07-12 NOTE — Progress Notes (Signed)
 [facility] Summary Report / Loop Recorder

## 2023-07-18 ENCOUNTER — Other Ambulatory Visit: Payer: Self-pay

## 2023-07-18 DIAGNOSIS — S065XAA Traumatic subdural hemorrhage with loss of consciousness status unknown, initial encounter: Secondary | ICD-10-CM

## 2023-07-18 DIAGNOSIS — I4819 Other persistent atrial fibrillation: Secondary | ICD-10-CM

## 2023-07-18 MED ORDER — APIXABAN 2.5 MG PO TABS: 2.5000 mg | ORAL_TABLET | Freq: Two times a day (BID) | ORAL | 0 refills | Status: DC

## 2023-07-18 NOTE — Addendum Note (Signed)
 Addended by: Gunnar Fusi A on: 07/18/2023 05:35 PM   Modules accepted: Orders

## 2023-07-18 NOTE — Telephone Encounter (Signed)
 Left message to call back to schedule LAAO.

## 2023-07-18 NOTE — Telephone Encounter (Signed)
 Spoke with the patient's spouse.  They wish to proceed with LAAO on 08/18/2023. Instructed Ms. Lohmann to start Eliquis 2.5 mg BID ASAP. Scheduled her for pre-procedure visit with Francis Dowse 08/09/2023. They were grateful for call and agreed with plan.

## 2023-07-20 ENCOUNTER — Telehealth: Payer: Self-pay

## 2023-07-20 NOTE — Telephone Encounter (Signed)
 Vicki Swanson

## 2023-08-08 DIAGNOSIS — R400 Somnolence: Secondary | ICD-10-CM | POA: Diagnosis not present

## 2023-08-08 DIAGNOSIS — R0789 Other chest pain: Secondary | ICD-10-CM | POA: Diagnosis not present

## 2023-08-08 DIAGNOSIS — N1832 Chronic kidney disease, stage 3b: Secondary | ICD-10-CM | POA: Diagnosis not present

## 2023-08-08 DIAGNOSIS — I69354 Hemiplegia and hemiparesis following cerebral infarction affecting left non-dominant side: Secondary | ICD-10-CM | POA: Diagnosis not present

## 2023-08-08 DIAGNOSIS — R9389 Abnormal findings on diagnostic imaging of other specified body structures: Secondary | ICD-10-CM | POA: Diagnosis not present

## 2023-08-08 NOTE — Progress Notes (Unsigned)
 Cardiology Office Note:  .   Date:  08/08/2023  ID:  Vicki Swanson, DOB 02-01-1946, MRN [id number] PCP: Si Gaul, DO  [facility] Providers Cardiologist:  None Electrophysiologist:  Nobie Putnam, MD {  History of Present Illness: .   Vicki Swanson is a 78 y.o. female w/PMHx of  HTN, COPD, GERD, OSA (intolerant/noncompliant w/CPAP) Stroke > ILR > AFib  Aug 2024 mechanical fall resulting in a SDH Husband reported several falls Eliquis held In out pt f/u with neurology > recommended she remain off OAC  Saw the AFib clnic 01/18/23 > persistent AFib, off OAC, Toprol increased for rate control, introduced idea of watchman and referred to Dr. Jimmey Ralph  Today's visit is scheduled as her pre-watchman visit  She saw Dr. Jimmey Ralph 02/18/23 Had not fallen again thankfully HAS-BLED score 5  CHA2DS2-VASc Score = 6  He spoke with the patient's neurologist, Dr. Stacy Gardner, and she states that the patient would be eligible for short-term anti-coagulation, minimizing duration as much as possible to limit risk. We will get a CT scan for Watchman and if anatomy is feasible for closure then start Eliquis for 3-4 weeks leading up to implant.    Saw the AFib clinic  a couple times, last 05/06/23, rates better  Scheduled 08/18/23 for her LAAO   ROS:   She is accompanied by her husband She started the Eliquis as instructed, reports 3 weeks so far No bleeding, signs of bleeding, no neuro changes, unusual headaches She reports some CP Sharp, goes across her chest seems usually right to left and associated with times of anxiety (sounds like this is fairly often) Is fleeting but leaves a lingering ache sometimes, all in all though seconds it seems  She is very sedentary, but ambulation does not provoke the pain Her husband says she has refused all offers of PT, rehab She can walk to the car from the house, short distances only before her legs get so weak that she nearly falls (and has  fallen) No near syncope or syncope  They do check her BP, it is usually very good can not recall HRs but they both report probably more often >100  Device information 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 The left atrial appendage is a large chicken wing morphology. 2. A 27mm Watchman FLX device is recommended based on the above landing zone measurements (22.5 mm maximum diameter; 23% compression). 3. There is no thrombus in the left atrial appendage on delayed imaging. 4. An inferior posterior IAS puncture site is recommended. 5. Optimal deployment angle: RAO 46 CAU 13 6. Normal coronary origin. Right dominance. Coronary artery calcium score is 196, which is 66th percentile for age and sex matched peers.   03/18/23: TTE  1. Left ventricular ejection fraction, by estimation, is 50%. The left  ventricle has mildly decreased function. The left ventricle demonstrates  global hypokinesis. Left ventricular diastolic parameters are  indeterminate.   2. Right ventricular systolic function is mildly reduced. The right  ventricular size is normal. There is normal pulmonary artery systolic  pressure. The estimated right ventricular systolic pressure is 33.9 mmHg.   3. Left atrial size was mildly dilated.  4. Right atrial size was mildly dilated.   5. The mitral valve is normal in structure. No evidence of mitral valve  regurgitation. No evidence of mitral stenosis.   6. Tricuspid valve regurgitation is mild to moderate.   7. The aortic valve is tricuspid. There is mild calcification of the  aortic valve. Aortic valve regurgitation is not visualized. No aortic  stenosis is  present.   8. The inferior vena cava is normal in size with greater than 50%  respiratory variability, suggesting right atrial pressure of 3 mmHg.   9. The patient was in atrial fibrillation.    Nuclear Stress 01/07/21:  LVEF 69%.  Normal study.  No evidence of ischemia or infarction.   Echo 04/14/20:  LVEF 65 to 70%.  Grade 2 diastolic dysfunction. Normal RV size and function. Mildly dilated left atrium. Mildly dilated right atrium. Aortic valve sclerosis without stenosis.   Risk Assessment/Calculations:    Physical Exam:   VS:  There were no vitals taken for this visit.   Wt Readings from Last 3 Encounters:  05/06/23 146 lb (66.2 kg)  04/18/23 148 lb 9.6 oz (67.4 kg)  04/11/23 146 lb (66.2 kg)    GEN: Well nourished, well developed in no acute distress NECK: No JVD; No carotid bruits CARDIAC: irreg-irreg, tachycardic, no murmurs, rubs, gallops RESPIRATORY:  CTA b/l without rales, wheezing or rhonchi  ABDOMEN: Soft, non-tender, non-distended EXTREMITIES: No edema; No deformity   ASSESSMENT AND PLAN: .    persistent AFib CHA2DS2Vasc is 6, on Eliquis compliant and tolerating Confirmed dose with Dr. Jimmey Ralph  Poor rate control Stop her amlodipine Start dilt 120mg  daily Watchman scheduled 08/18/23 We revisited what to expect day of her procedure, post procedure She remains agreeable Labs today   HTN Looks good  Secondary hypercoagulable state 2/2 AFib  4. CP Sounds atypical, perhaps Afib (?) Rate control as above Neg stress test 2022    Dispo: post procedural follow up as usual  Signed, Sheilah Pigeon, PA-C

## 2023-08-09 ENCOUNTER — Encounter: Payer: Self-pay | Admitting: Physician Assistant

## 2023-08-09 ENCOUNTER — Ambulatory Visit: Attending: Cardiology | Admitting: Physician Assistant

## 2023-08-09 VITALS — BP 120/70 | HR 127 | Ht 62.0 in | Wt 139.8 lb

## 2023-08-09 DIAGNOSIS — I4819 Other persistent atrial fibrillation: Secondary | ICD-10-CM | POA: Diagnosis not present

## 2023-08-09 DIAGNOSIS — I10 Essential (primary) hypertension: Secondary | ICD-10-CM

## 2023-08-09 DIAGNOSIS — Z95818 Presence of other cardiac implants and grafts: Secondary | ICD-10-CM

## 2023-08-09 DIAGNOSIS — D6869 Other thrombophilia: Secondary | ICD-10-CM | POA: Diagnosis not present

## 2023-08-09 DIAGNOSIS — R0789 Other chest pain: Secondary | ICD-10-CM | POA: Diagnosis not present

## 2023-08-09 MED ORDER — DILTIAZEM HCL ER COATED BEADS 120 MG PO CP24: 120.0000 mg | ORAL_CAPSULE | Freq: Every day | ORAL | 3 refills | Status: AC

## 2023-08-09 NOTE — Patient Instructions (Signed)
 Medication Instructions:   START TAKING: DILTIAZEM  120 MG ONCE A DAY   STOP TAKING AND REMOVE THIS MEDICATION FROM YOUR MEDICATION LIST:  AMLODIPINE   *If you need a refill on your cardiac medications before your next appointment, please call your pharmacy*    Lab Work:  PLEASE GO DOWN STAIRS  [facility]  FIRST FLOOR  [address] ( GET OFF ELEVATORS MAKE A LEFT AND ANOTHER LEFT LAB ON RIGHT DOWN HALLWAY :  BMET AND CBC TODAY     If you have labs (blood work) drawn today and your tests are completely normal, you will receive your results only by: MyChart Message (if you have MyChart) OR A paper copy in the mail If you have any lab test that is abnormal or we need to change your treatment, we will call you to review the results.   Testing/Procedures: NONE ORDERED  TODAY    Follow-Up: At [facility], you and your health needs are our priority.  As part of our continuing mission to provide you with exceptional heart care, our providers are all part of one team.  This team includes your primary Cardiologist (physician) and Advanced Practice Providers or APPs (Physician Assistants and Nurse Practitioners) who all work together to provide you with the care you need, when you need it.  Your next appointment:  WILL BE CONTACTED BY NURSE  SCHEDULAR  FOR FOLLOW UP APPOINTMENTS   We recommend signing up for the patient portal called "MyChart".  Sign up information is provided on this After Visit Summary.  MyChart is used to connect with patients for Virtual Visits (Telemedicine).  Patients are able to view lab/test results, encounter notes, upcoming appointments, etc.  Non-urgent messages can be sent to your provider as well.   To learn more about what you can do with MyChart, go to [email or web address].   Other Instructions       1st Floor: - Lobby - Registration  - Pharmacy  - Lab - Cafe  2nd Floor: - PV Lab - Diagnostic Testing (echo, CT, nuclear med)  3rd Floor: -  Vacant  4th Floor: - TCTS (cardiothoracic surgery) - AFib Clinic - Structural Heart Clinic - Vascular Surgery  - Vascular Ultrasound  5th Floor: - HeartCare Cardiology (general and EP) - Clinical Pharmacy for coumadin, hypertension, lipid, weight-loss medications, and med management appointments    Valet parking services will be available as well.

## 2023-08-10 LAB — BASIC METABOLIC PANEL WITH GFR
BUN/Creatinine Ratio: 10 — ABNORMAL LOW (ref 12–28)
BUN: 13 mg/dL (ref 8–27)
CO2: 20 mmol/L (ref 20–29)
Calcium: 9.4 mg/dL (ref 8.7–10.3)
Chloride: 109 mmol/L — ABNORMAL HIGH (ref 96–106)
Creatinine, Ser: 1.25 mg/dL — ABNORMAL HIGH (ref 0.57–1.00)
Glucose: 88 mg/dL (ref 70–99)
Potassium: 3.8 mmol/L (ref 3.5–5.2)
Sodium: 145 mmol/L — ABNORMAL HIGH (ref 134–144)
eGFR: 44 mL/min/{1.73_m2} — ABNORMAL LOW (ref >59)

## 2023-08-10 LAB — CBC
Hematocrit: 43.6 % (ref 34.0–46.6)
Hemoglobin: 14.4 g/dL (ref 11.1–15.9)
MCH: 31.2 pg (ref 26.6–33.0)
MCHC: 33 g/dL (ref 31.5–35.7)
MCV: 95 fL (ref 79–97)
Platelets: 161 10*3/uL (ref 150–450)
RBC: 4.61 x10E6/uL (ref 3.77–5.28)
RDW: 14 % (ref 11.7–15.4)
WBC: 8.7 10*3/uL (ref 3.4–10.8)

## 2023-08-10 NOTE — Progress Notes (Signed)
 [facility] Summary Report / Loop Recorder

## 2023-08-15 ENCOUNTER — Ambulatory Visit (INDEPENDENT_AMBULATORY_CARE_PROVIDER_SITE_OTHER): Payer: Medicare Other

## 2023-08-15 DIAGNOSIS — I4819 Other persistent atrial fibrillation: Secondary | ICD-10-CM | POA: Diagnosis not present

## 2023-08-16 LAB — CUP PACEART REMOTE DEVICE CHECK
Date Time Interrogation Session: 20250413230450
Implantable Pulse Generator Implant Date: 20211215

## 2023-08-17 ENCOUNTER — Telehealth: Payer: Self-pay | Admitting: Cardiology

## 2023-08-17 NOTE — Telephone Encounter (Signed)
 This encounter was created in error - please disregard.

## 2023-08-17 NOTE — Telephone Encounter (Signed)
 Spoke with patient and she states she didn't have the soap for the procedure she need to use the night before.  Informed patient she can come to the office and pick up the soap or she can use plain dial soap. She states she has los of dial soap at home.

## 2023-08-17 NOTE — Telephone Encounter (Signed)
 Patient says she doesn't have the specific soap needed for watchman procedure and can't find it anywhere. Please advise.

## 2023-08-17 NOTE — Telephone Encounter (Signed)
 Confirmed procedure date of 08/18/2023. Confirmed arrival time of 0730 for procedure time at 1000. Reviewed pre-procedure instructions with patient. Confirmed she has not missed any doses of Eliquis and will take her last dose tonight. She will not take any meds in the AM. She lost her CHG soap and has been directed to use Dial. Confirmed she has no contrast allergy and no PPM or defibrillator. She was grateful for call and agreed with plan.

## 2023-08-17 NOTE — Telephone Encounter (Signed)
 Left message to call back

## 2023-08-18 ENCOUNTER — Encounter (HOSPITAL_COMMUNITY): Payer: Self-pay | Admitting: Cardiology

## 2023-08-18 ENCOUNTER — Inpatient Hospital Stay (HOSPITAL_COMMUNITY): Admitting: Certified Registered Nurse Anesthetist

## 2023-08-18 ENCOUNTER — Inpatient Hospital Stay (HOSPITAL_COMMUNITY)

## 2023-08-18 ENCOUNTER — Other Ambulatory Visit: Payer: Self-pay

## 2023-08-18 ENCOUNTER — Inpatient Hospital Stay (HOSPITAL_COMMUNITY)
Admission: RE | Admit: 2023-08-18 | Discharge: 2023-08-19 | DRG: 274 | Disposition: A | Discharge status: Home / Self Care | Attending: Cardiology | Admitting: Cardiology

## 2023-08-18 ENCOUNTER — Encounter (HOSPITAL_COMMUNITY)
Admission: RE | Disposition: A | Discharge status: Home / Self Care | Payer: Self-pay | Source: Home / Self Care | Attending: Cardiology

## 2023-08-18 DIAGNOSIS — D6869 Other thrombophilia: Secondary | ICD-10-CM | POA: Diagnosis not present

## 2023-08-18 DIAGNOSIS — Z01818 Encounter for other preprocedural examination: Secondary | ICD-10-CM | POA: Diagnosis not present

## 2023-08-18 DIAGNOSIS — I10 Essential (primary) hypertension: Secondary | ICD-10-CM

## 2023-08-18 DIAGNOSIS — E785 Hyperlipidemia, unspecified: Secondary | ICD-10-CM | POA: Diagnosis not present

## 2023-08-18 DIAGNOSIS — I3139 Other pericardial effusion (noninflammatory): Secondary | ICD-10-CM | POA: Diagnosis not present

## 2023-08-18 DIAGNOSIS — I4819 Other persistent atrial fibrillation: Secondary | ICD-10-CM

## 2023-08-18 DIAGNOSIS — I340 Nonrheumatic mitral (valve) insufficiency: Secondary | ICD-10-CM | POA: Diagnosis present

## 2023-08-18 DIAGNOSIS — J449 Chronic obstructive pulmonary disease, unspecified: Secondary | ICD-10-CM | POA: Diagnosis not present

## 2023-08-18 DIAGNOSIS — I4891 Unspecified atrial fibrillation: Secondary | ICD-10-CM | POA: Diagnosis not present

## 2023-08-18 DIAGNOSIS — Z7982 Long term (current) use of aspirin: Secondary | ICD-10-CM

## 2023-08-18 DIAGNOSIS — Z87820 Personal history of traumatic brain injury: Secondary | ICD-10-CM | POA: Diagnosis not present

## 2023-08-18 DIAGNOSIS — I6200 Nontraumatic subdural hemorrhage, unspecified: Secondary | ICD-10-CM | POA: Diagnosis not present

## 2023-08-18 DIAGNOSIS — R918 Other nonspecific abnormal finding of lung field: Secondary | ICD-10-CM | POA: Diagnosis not present

## 2023-08-18 DIAGNOSIS — K219 Gastro-esophageal reflux disease without esophagitis: Secondary | ICD-10-CM | POA: Diagnosis not present

## 2023-08-18 DIAGNOSIS — S065XAA Traumatic subdural hemorrhage with loss of consciousness status unknown, initial encounter: Secondary | ICD-10-CM

## 2023-08-18 DIAGNOSIS — G4733 Obstructive sleep apnea (adult) (pediatric): Secondary | ICD-10-CM | POA: Diagnosis not present

## 2023-08-18 DIAGNOSIS — Z006 Encounter for examination for normal comparison and control in clinical research program: Secondary | ICD-10-CM

## 2023-08-18 DIAGNOSIS — Z95818 Presence of other cardiac implants and grafts: Secondary | ICD-10-CM

## 2023-08-18 DIAGNOSIS — I959 Hypotension, unspecified: Secondary | ICD-10-CM | POA: Diagnosis not present

## 2023-08-18 HISTORY — DX: Presence of other cardiac implants and grafts: Z95.818

## 2023-08-18 HISTORY — PX: LEFT ATRIAL APPENDAGE OCCLUSION: EP1229

## 2023-08-18 HISTORY — PX: TRANSESOPHAGEAL ECHOCARDIOGRAM (CATH LAB): EP1270

## 2023-08-18 LAB — SURGICAL PCR SCREEN
MRSA, PCR: NEGATIVE
Staphylococcus aureus: NEGATIVE

## 2023-08-18 LAB — ABO/RH: ABO/RH(D): A POS

## 2023-08-18 LAB — ECHO TEE

## 2023-08-18 LAB — POCT ACTIVATED CLOTTING TIME: Activated Clotting Time: 285 s

## 2023-08-18 LAB — TYPE AND SCREEN
ABO/RH(D): A POS
Antibody Screen: NEGATIVE

## 2023-08-18 SURGERY — LEFT ATRIAL APPENDAGE OCCLUSION
Anesthesia: General

## 2023-08-18 MED ORDER — LIDOCAINE 2% (20 MG/ML) 5 ML SYRINGE
INTRAMUSCULAR | Status: DC | PRN
  Administered 2023-08-18: 60 mg via INTRAVENOUS

## 2023-08-18 MED ORDER — SUGAMMADEX SODIUM 200 MG/2ML IV SOLN
INTRAVENOUS | Status: DC | PRN
  Administered 2023-08-18: 200 mg via INTRAVENOUS

## 2023-08-18 MED ORDER — TOPIRAMATE 25 MG PO TABS
50.0000 mg | ORAL_TABLET | Freq: Two times a day (BID) | ORAL | Status: DC
  Administered 2023-08-18 – 2023-08-19 (×2): 50 mg via ORAL
  Filled 2023-08-18 (×2): qty 2

## 2023-08-18 MED ORDER — ONDANSETRON HCL 4 MG/2ML IJ SOLN
INTRAMUSCULAR | Status: DC | PRN
  Administered 2023-08-18: 4 mg via INTRAVENOUS

## 2023-08-18 MED ORDER — SODIUM CHLORIDE 0.9 % IV SOLN: INTRAVENOUS | Status: AC

## 2023-08-18 MED ORDER — CHLORHEXIDINE GLUCONATE 0.12 % MT SOLN: 15.0000 mL | Freq: Once | OROMUCOSAL | Status: AC

## 2023-08-18 MED ORDER — SODIUM CHLORIDE 0.9 % IV SOLN
INTRAVENOUS | Status: DC
Start: 2023-08-18 — End: 2023-08-18

## 2023-08-18 MED ORDER — SODIUM CHLORIDE 0.9% FLUSH: 3.0000 mL | INTRAVENOUS | Status: DC | PRN

## 2023-08-18 MED ORDER — CHLORHEXIDINE GLUCONATE 0.12 % MT SOLN
OROMUCOSAL | Status: AC
Start: 2023-08-18 — End: 2023-08-18
  Administered 2023-08-18: 15 mL via OROMUCOSAL
  Filled 2023-08-18: qty 15

## 2023-08-18 MED ORDER — ACETAMINOPHEN 325 MG PO TABS: 650.0000 mg | ORAL_TABLET | ORAL | Status: DC | PRN

## 2023-08-18 MED ORDER — METOPROLOL SUCCINATE ER 100 MG PO TB24
100.0000 mg | ORAL_TABLET | Freq: Two times a day (BID) | ORAL | Status: DC
  Administered 2023-08-18: 100 mg via ORAL
  Filled 2023-08-18: qty 1

## 2023-08-18 MED ORDER — SODIUM CHLORIDE 0.9% FLUSH
3.0000 mL | Freq: Two times a day (BID) | INTRAVENOUS | Status: DC
  Administered 2023-08-18 – 2023-08-19 (×3): 3 mL via INTRAVENOUS

## 2023-08-18 MED ORDER — LISINOPRIL 20 MG PO TABS: 20.0000 mg | ORAL_TABLET | Freq: Every day | ORAL | Status: DC

## 2023-08-18 MED ORDER — SODIUM CHLORIDE 0.9 % IV SOLN: INTRAVENOUS | Status: DC

## 2023-08-18 MED ORDER — PHENYLEPHRINE HCL-NACL 20-0.9 MG/250ML-% IV SOLN
INTRAVENOUS | Status: DC | PRN
  Administered 2023-08-18: 25 ug/min via INTRAVENOUS

## 2023-08-18 MED ORDER — SERTRALINE HCL 50 MG PO TABS
150.0000 mg | ORAL_TABLET | Freq: Every day | ORAL | Status: DC
  Administered 2023-08-18: 150 mg via ORAL
  Filled 2023-08-18: qty 1

## 2023-08-18 MED ORDER — AMLODIPINE BESYLATE 5 MG PO TABS: 5.0000 mg | ORAL_TABLET | Freq: Every day | ORAL | Status: DC

## 2023-08-18 MED ORDER — DILTIAZEM HCL ER COATED BEADS 240 MG PO CP24: 240.0000 mg | ORAL_CAPSULE | Freq: Every day | ORAL | Status: DC

## 2023-08-18 MED ORDER — APIXABAN 2.5 MG PO TABS
2.5000 mg | ORAL_TABLET | Freq: Two times a day (BID) | ORAL | Status: DC
  Administered 2023-08-18 (×2): 2.5 mg via ORAL
  Filled 2023-08-18 (×2): qty 1

## 2023-08-18 MED ORDER — CEFAZOLIN SODIUM-DEXTROSE 2-4 GM/100ML-% IV SOLN
INTRAVENOUS | Status: AC
  Filled 2023-08-18: qty 100

## 2023-08-18 MED ORDER — PHENYLEPHRINE 80 MCG/ML (10ML) SYRINGE FOR IV PUSH (FOR BLOOD PRESSURE SUPPORT)
PREFILLED_SYRINGE | INTRAVENOUS | Status: DC | PRN
  Administered 2023-08-18 (×2): 120 ug via INTRAVENOUS

## 2023-08-18 MED ORDER — LACTATED RINGERS IV SOLN: INTRAVENOUS | Status: DC

## 2023-08-18 MED ORDER — DILTIAZEM HCL 60 MG PO TABS
60.0000 mg | ORAL_TABLET | Freq: Once | ORAL | Status: AC
  Administered 2023-08-18: 60 mg via ORAL
  Filled 2023-08-18: qty 1

## 2023-08-18 MED ORDER — CEFAZOLIN SODIUM-DEXTROSE 2-4 GM/100ML-% IV SOLN
2.0000 g | INTRAVENOUS | Status: AC
  Administered 2023-08-18 (×2): 2 g via INTRAVENOUS
  Filled 2023-08-18: qty 100

## 2023-08-18 MED ORDER — ESMOLOL HCL 100 MG/10ML IV SOLN
INTRAVENOUS | Status: DC | PRN
  Administered 2023-08-18: 30 mg via INTRAVENOUS

## 2023-08-18 MED ORDER — ONDANSETRON HCL 4 MG/2ML IJ SOLN
4.0000 mg | Freq: Four times a day (QID) | INTRAMUSCULAR | Status: DC | PRN
Start: 2023-08-18 — End: 2023-08-19

## 2023-08-18 MED ORDER — METOPROLOL TARTRATE 25 MG PO TABS
25.0000 mg | ORAL_TABLET | Freq: Once | ORAL | Status: AC
  Administered 2023-08-18: 25 mg via ORAL
  Filled 2023-08-18: qty 1

## 2023-08-18 MED ORDER — SODIUM CHLORIDE 0.9 % IV BOLUS
500.0000 mL | Freq: Once | INTRAVENOUS | Status: AC
  Administered 2023-08-18: 500 mL via INTRAVENOUS

## 2023-08-18 MED ORDER — CHLORHEXIDINE GLUCONATE 4 % EX SOLN: Freq: Once | CUTANEOUS | Status: DC

## 2023-08-18 MED ORDER — SODIUM CHLORIDE 0.9 % IV SOLN: 250.0000 mL | INTRAVENOUS | Status: DC | PRN

## 2023-08-18 MED ORDER — APIXABAN 5 MG PO TABS
ORAL_TABLET | ORAL | Status: DC
Start: 2023-08-18 — End: 2023-08-18
  Filled 2023-08-18: qty 1

## 2023-08-18 MED ORDER — ROCURONIUM BROMIDE 10 MG/ML (PF) SYRINGE
PREFILLED_SYRINGE | INTRAVENOUS | Status: DC | PRN
  Administered 2023-08-18: 70 mg via INTRAVENOUS

## 2023-08-18 MED ORDER — HEPARIN (PORCINE) IN NACL 2000-0.9 UNIT/L-% IV SOLN
INTRAVENOUS | Status: DC | PRN
  Administered 2023-08-18: 1000 mL

## 2023-08-18 MED ORDER — ROSUVASTATIN CALCIUM 5 MG PO TABS
10.0000 mg | ORAL_TABLET | Freq: Every day | ORAL | Status: DC
  Administered 2023-08-18 – 2023-08-19 (×2): 10 mg via ORAL
  Filled 2023-08-18 (×2): qty 2

## 2023-08-18 MED ORDER — HEPARIN SODIUM (PORCINE) 1000 UNIT/ML IJ SOLN
INTRAMUSCULAR | Status: DC | PRN
  Administered 2023-08-18: 10000 [IU] via INTRAVENOUS
  Administered 2023-08-18: 1000 [IU] via INTRAVENOUS

## 2023-08-18 MED ORDER — PROPOFOL 10 MG/ML IV BOLUS
INTRAVENOUS | Status: DC | PRN
  Administered 2023-08-18: 100 mg via INTRAVENOUS

## 2023-08-18 MED ORDER — DILTIAZEM HCL ER COATED BEADS 120 MG PO CP24
120.0000 mg | ORAL_CAPSULE | Freq: Every day | ORAL | Status: DC
  Administered 2023-08-18: 120 mg via ORAL
  Filled 2023-08-18: qty 1

## 2023-08-18 MED ORDER — IOHEXOL 350 MG/ML SOLN
INTRAVENOUS | Status: DC | PRN
  Administered 2023-08-18: 15 mL

## 2023-08-18 SURGICAL SUPPLY — 18 items
CATH INFINITI 5FR ANG PIGTAIL (CATHETERS) IMPLANT
CATH WATCHMAN STEER ACCESS SYS (CATHETERS) IMPLANT
CLOSURE PERCLOSE PROSTYLE (VASCULAR PRODUCTS) IMPLANT
DEVICE WATCHMAN FLX PRO PROC (KITS) IMPLANT
DEVICE WATCHMAN TRUSTEER PROC (KITS) IMPLANT
DILATOR VESSEL 38 20CM 12FR (INTRODUCER) IMPLANT
KIT VERSACROSS CON 12FR 85 (KITS) IMPLANT
PACK CARDIAC CATHETERIZATION (CUSTOM PROCEDURE TRAY) ×1 IMPLANT
PAD DEFIB RADIO PHYSIO CONN (PAD) ×1 IMPLANT
SHEATH PERFORMER 18FRX30 (VASCULAR PRODUCTS) IMPLANT
SHEATH PINNACLE 8F 10CM (SHEATH) IMPLANT
SHEATH PROBE COVER 6X72 (BAG) ×1 IMPLANT
SYR CONTROL 10ML ANGIOGRAPHIC (SYRINGE) IMPLANT
TRANSDUCER W/STOPCOCK (MISCELLANEOUS) ×1 IMPLANT
WATCHMAN FLX 24 (Prosthesis & Implant Heart) IMPLANT
WATCHMAN FLX 27 (Prosthesis & Implant Heart) IMPLANT
WATCHMAN FLX PRO PROCEDURE (KITS) ×1 IMPLANT
WATCHMAN TRUSTEER PROCEDURE (KITS) ×1 IMPLANT

## 2023-08-18 NOTE — H&P (Signed)
 Electrophysiology Note:   Date:  08/18/23  ID:  Vicki Swanson, DOB [birth year], MRN [id number]   Primary Cardiologist: None Electrophysiologist: Ardeen Kohler, MD       History of Present Illness:   Vicki Swanson is a 78 y.o. female with h/o HTN, COPD, GERD, CVA, OSA, subdural and atrial fibrillation who is seen today for evaluation of Watchman device implant at the request of Dr. Lawana Pray.    Patient was admitted on 12/17/2022 for mechanical fall resulting in a small subdural hematoma.  Her hospital course was complicated by atrial fibrillation with rapid ventricular response.  Eliquis was discontinued at that time.  She was seen by her neurologist on 01/11/2023 who recommended she remain off Eliquis indefinitely. She is taking aspirin 81mg  twice daily. She continues to feel unsteady on her feet and thinks she is a high fall risk, but has not had any true falls since her subdural hematoma.    Interval: Patient presents today for scheduled Watchman device implant. She reports doing relatively well. No new or acute complaints today.   Review of systems complete and found to be negative unless listed in HPI.    EP Information / Studies Reviewed:     EKG is not ordered today. EKG from 01/18/23 reviewed which showed atrial fibrillation.       Nuclear Stress 01/07/21:  LVEF 69%.  Normal study.  No evidence of ischemia or infarction.   Echo 04/14/20:  LVEF 65 to 70%.  Grade 2 diastolic dysfunction. Normal RV size and function. Mildly dilated left atrium. Mildly dilated right atrium. Aortic valve sclerosis without stenosis.   Risk Assessment/Calculations:     CHA2DS2-VASc Score = 6   This indicates a 9.7% annual risk of stroke. The patient's score is based upon: CHF History: 0 HTN History: 1 Diabetes History: 0 Stroke History: 2 Vascular Disease History: 0 Age Score: 2 Gender Score: 1     Physical Exam:    Today's Vitals   08/18/23 0751 08/18/23 0815  BP: 115/86   Pulse: 83    Resp: 18   Temp: 97.7 F (36.5 C)   TempSrc: Oral   SpO2: 97%   Weight: 63.5 kg   Height: 5\' 2"  (1.575 m)   PainSc:  0-No pain   Body mass index is 25.61 kg/m.  GEN: Well nourished, well developed in no acute distress NECK: No JVD CARDIAC: Irregularly irregular rate and rhythm, no murmurs, rubs, gallops RESPIRATORY:  Clear to auscultation without rales, wheezing or rhonchi  ABDOMEN: Soft, non-distended EXTREMITIES:  No edema; No deformity    ASSESSMENT AND PLAN:   Vicki Swanson is a 78 y.o. female with h/o HTN, COPD, GERD, CVA, OSA, subdural and atrial fibrillation who is seen today for evaluation of Watchman device implant at the request of Dr. Lawana Pray.    I have seen Vicki Swanson in the office today who is being considered for a Watchman left atrial appendage closure device. I believe they will benefit from this procedure given their history of atrial fibrillation, CHA2DS2-VASc score of 6 and unadjusted ischemic stroke rate of 9.7% per year. Unfortunately, the patient is not felt to be a long term anticoagulation candidate secondary to fall risk and intracranial bleed.    It is my belief that after undergoing a LAA closure procedure, Vicki Swanson will not need long term anticoagulation which eliminates anticoagulation side effects and major bleeding risk.    Procedural risks for the Watchman implant have been reviewed  with the patient including a 0.5% risk of stroke, <1% risk of perforation and <1% risk of device embolization. Other risks include bleeding, vascular damage, tamponade, worsening renal function, and death. The patient understands these risk and wishes to proceed.     The published clinical data on the safety and effectiveness of WATCHMAN include but are not limited to the following: - Holmes DR, Everlene Farrier, Sick P et al. for the PROTECT AF Investigators. Percutaneous closure of the left atrial appendage versus warfarin therapy for prevention of stroke in patients  with atrial fibrillation: a randomised non-inferiority trial. Lancet 2009; 374: 534-42. Everlene Farrier, Doshi SK, Isa Rankin D et al. on behalf of the PROTECT AF Investigators. Percutaneous Left Atrial Appendage Closure for Stroke Prophylaxis in Patients With Atrial Fibrillation 2.3-Year Follow-up of the PROTECT AF (Watchman Left Atrial Appendage System for Embolic Protection in Patients With Atrial Fibrillation) Trial. Circulation 2013; 127:720-729. - Alli O, Doshi S,  Kar S, Reddy VY, Sievert H et al. Quality of Life Assessment in the Randomized PROTECT AF (Percutaneous Closure of the Left Atrial Appendage Versus Warfarin Therapy for Prevention of Stroke in Patients With Atrial Fibrillation) Trial of Patients at Risk for Stroke With Nonvalvular Atrial Fibrillation. J Am Coll Cardiol 2013; 61:1790-8. Aline August DR, Mia [place], Price M, Whisenant B, Sievert H, Doshi S, Huber K, Reddy V. Prospective randomized evaluation of the Watchman left atrial appendage Device in patients with atrial fibrillation versus long-term warfarin therapy; the PREVAIL trial. Journal of the [employer] of Cardiology, Vol. 4, No. 1, 2014, 1-11. - Kar S, Doshi SK, Sadhu A, Horton R, Osorio J et al. Primary outcome evaluation of a next-generation left atrial appendage closure device: results from the PINNACLE FLX trial. Circulation 2021;143(18)1754-1762.    After today's visit with the patient which was dedicated solely for shared decision making visit regarding LAA closure device, the patient decided to proceed with the LAA appendage closure procedure scheduled to be done in the near future at [facility].   Prior to the procedure, I would like to obtain a gated CT scan of the chest with contrast timed for PV/LA visualization.    Additionally, we will get an echocardiogram to reassess LVEF.    HAS-BLED score 5 Hypertension Yes  Abnormal renal and liver function (Dialysis, transplant, Cr >2.26 mg/dL /Cirrhosis or  Bilirubin >2x Normal or AST/ALT/AP >3x Normal) No  Stroke Yes  Bleeding Yes  Labile INR (Unstable/high INR) No  Elderly (>65) Yes  Drugs or alcohol (>= 8 drinks/week, anti-plt or NSAID) Yes    CHA2DS2-VASc Score = 6  The patient's score is based upon: CHF History: 0 HTN History: 1 Diabetes History: 0 Stroke History: 2 Vascular Disease History: 0 Age Score: 2 Gender Score: 1         ASSESSMENT AND PLAN: Persistent Atrial Fibrillation (ICD10:  I48.19) The patient's CHA2DS2-VASc score is 6, indicating a 9.7% annual risk of stroke.   Secondary Hypercoagulable State (ICD10:  D68.69) The patient is at significant risk for stroke/thromboembolism based upon her CHA2DS2-VASc Score of 6. Patient has been cleared by her neurologist for short-term anti-coagulation. We will continue Eliquis 2.5mg  twice daily for 45 days after implant.    Signed,  Nobie Putnam, MD    02/18/2023 9:37 AM

## 2023-08-18 NOTE — Anesthesia Procedure Notes (Signed)
 Procedure Name: Intubation Date/Time: 08/18/2023 11:41 AM  Performed by: Derral Flick, CRNAPre-anesthesia Checklist: Patient identified, Emergency Drugs available, Suction available and Patient being monitored Patient Re-evaluated:Patient Re-evaluated prior to induction Oxygen Delivery Method: Circle system utilized Preoxygenation: Pre-oxygenation with 100% oxygen Induction Type: IV induction Ventilation: Mask ventilation without difficulty Laryngoscope Size: Miller and 2 Grade View: Grade II Tube type: Oral Tube size: 7.0 mm Number of attempts: 1 Airway Equipment and Method: Stylet and Oral airway Placement Confirmation: ETT inserted through vocal cords under direct vision, positive ETCO2 and breath sounds checked- equal and bilateral Tube secured with: Tape Dental Injury: Teeth and Oropharynx as per pre-operative assessment

## 2023-08-18 NOTE — Progress Notes (Signed)
 Page sent at 1836 to [facility] APP:  6E 22-- Vicki Swanson. S/P Watchman with Dr. Daneil Dunker. HR sustained 130-150s Afib. Is she ok to go home? Bedrest over. ambulating. but HR sustained. Thanks [place], APP called back to state that she [name]. NP was in contact with Dr. Daneil Dunker and he would determine if she can discharge home.   1905-- [name]. NP stated to not discharge patient tonight due to elevated HR 1935- Dr. Daneil Dunker at bedside, new orders received.      08/18/23 1840  Provider Notification  Provider Name/Title [name] NP/ Cardiology  Date Provider Notified 08/18/23  Time Provider Notified 1845  Method of Notification Page (Page to [facility] Cards, Secure Chat to Kaka O./NP)  Notification Reason Change in status (Discharge order in, HR sustained in 150s despite oral meds)  Provider response Evaluate remotely;See new orders  Date of Provider Response 08/18/23  Time of Provider Response 1940

## 2023-08-18 NOTE — Discharge Summary (Addendum)
 Electrophysiology Discharge Summary   Patient ID: DEARI SESSLER,  MRN: [id number], DOB/AGE: [birth year] 79 y.o.  Admit date: 08/18/2023 Discharge date: 08/19/2023  Primary Care Physician: Dannie Duval, DO  Primary Cardiologist: None  Electrophysiologist: Ardeen Kohler, MD     Primary Discharge Diagnosis:  Persistent Atrial Fibrillation Poor candidacy for long term anticoagulation due to h/o intracranial hemorrhage  Secondary Discharge Diagnosis:  HTN CVA OSA COPD GERD  Mechanical Falls   Procedures This Admission:  Transeptal Puncture Intra-procedural TEE which showed no LAA thrombus Left atrial appendage occlusive device placement on 08/18/23 by Dr. Daneil Dunker.   This study demonstrated: 1.Successful implantation of a 24mm WATCHMAN Flx Pro left atrial appendage occlusive device (17-21% compression).   2. TEE demonstrating no LAA thrombus. 3. No early apparent complications.    Post Implant Anticoagulation Strategy: Eliquis  2.5mg  twice daily for 45 days then transition to DAPT - aspirin  81mg  daily and clopidogrel  75mg  daily.      Brief HPI: GLENDOLA FRIEDHOFF is a 78 y.o. female with a history of Persistent Atrial Fibrillation who was referred to Electrophysiology in the outpatient setting.   Hospital Course:  The patient was admitted and underwent left atrial appendage occlusive device placement as above.  The patient was monitored in the post procedure setting and has done very well with no concerns. Given this, she is being considered for same day discharge later today. Groin site has been stable without evidence of hematoma or bleeding. Wound care and restrictions were reviewed with the patient.   The patient has been scheduled for post procedure follow up with EP APP in approximately 6 weeks. Eliquis  resumed to continue for 45 days (to her follow up appointment) then stop. At that time she will transition to Plavix  75mg  daily to complete 6 months of therapy. They will  require dental SBE for 6 month post op and should refrain from dental work or cleanings for the first 45 days post implant. SBE to be RXd at follow up.    Planned initially for discharge though held for Afib w/RVR HR has settled well, 90's  BP overnight did get low towards SBP 80's-90's Quick look bedside echo by Dr. Daneil Dunker without significant effusion (known to have small effusion by TEE per-procedure > stable post deployment) Official limited echo done notes no new/worsening effusion  BP recovered back to her baseline SBP 100's without intervention BP has remained stable after meds given She has ambulated without difficulty/symptoms Groin site remains stable  K+ and mag low > replaced Follow up labs ordered for next week  Will continue her  Toprol  100mg  BID Diltiazem  120mg   STOP amlodipine  and lisinopril  for now  A repeat CT scan will be performed in approximately 60 days to ensure proper seal of the device.  EP team follow up Is in place   Physical Exam: Vitals:   08/19/23 0223 08/19/23 0440 08/19/23 0728 08/19/23 1138  BP: (!) 86/63 99/65 107/83 (!) 108/92  Pulse:  95 76 90  Resp:  16 18 17   Temp:  98.8 F (37.1 C) 98.4 F (36.9 C) 98.6 F (37 C)  TempSrc:  Oral Oral Oral  SpO2:  97% 97% 98%  Weight:      Height:        GEN: elderly, well nourished, well developed in no acute distress NECK: No JVD; No carotid bruits CARDIAC: Irregularly irregular rate and rhythm, no murmurs, rubs, gallops RESPIRATORY: Clear to auscultation without rales, wheezing or rhonchi  ABDOMEN:  Soft, non-tender, non-distended EXTREMITIES:  No edema; No deformity. Groin site Stable without hematoma or bruising   Discharge Medications:  Allergies as of 08/19/2023   No Known Allergies      Medication List     STOP taking these medications    amLODipine  5 MG tablet Commonly known as: NORVASC    lisinopril  20 MG tablet Commonly known as: ZESTRIL        TAKE these medications     apixaban  2.5 MG Tabs tablet Commonly known as: ELIQUIS  Take 1 tablet (2.5 mg total) by mouth 2 (two) times daily.   b complex vitamins capsule Take 1 capsule by mouth daily.   diltiazem  120 MG 24 hr capsule Commonly known as: CARDIZEM  CD Take 1 capsule (120 mg total) by mouth daily.   metoprolol  succinate 100 MG 24 hr tablet Commonly known as: TOPROL -XL Take 1 tablet (100 mg total) by mouth in the morning and at bedtime. Take with or immediately following a meal.   rosuvastatin  10 MG tablet Commonly known as: CRESTOR  Take 10 mg by mouth daily.   sertraline  100 MG tablet Commonly known as: ZOLOFT  Take 150 mg by mouth at bedtime.   topiramate  25 MG tablet Commonly known as: TOPAMAX  Take 50 mg by mouth 2 (two) times daily.   VITAMIN C PO Take 2,000 mg by mouth daily.   Vitamin D3 50 MCG (2000 UT) capsule Take 2,000 Units by mouth daily.         Disposition:  Home with usual follow up as in AVS   Signed, Creighton Doffing, NP-C, AGACNP-BC [facility] - Electrophysiology  08/19/2023, 4:01 PM  I have seen, examined the patient, and reviewed the above assessment and plan.    Hospital Course: Patient underwent successful implantation of 24 mm Watchman Flex Pro device.  She was kept overnight for monitoring due to uncontrolled rates in atrial fibrillation.  Of note, she had uncontrolled rates on EKG during office visit on 4/8 and uncontrolled rates in the prep area prior to procedure.  She was given IV fluids and her home metoprolol  and diltiazem  were optimized.  She had an echocardiogram which showed no acute process.  Her systolic blood pressure was in the low 100s. Patient reports that she has not been checking blood pressure as frequently at home, usually in the low 100s, but this has been lower than what was her previous normal. Her lisinopril  and amlodipine  were discontinued.   GEN: No acute distress.   Cardiac: Normal rate, irregular rhythm. Resp: Normal  respiratory effort. R groin: Soft without bleeding or hematoma. Psych: Normal affect   Plan:  - Continue Eliquis  2.5 mg twice daily for the next 45 days.  At that point, she will transition to aspirin  81 mg and Plavix  75 mg once daily.  She will have follow-up in our EP clinic in approximately 6 weeks.We will order a CT scan to reassess Watchman device at 60 days.   Duration of Discharge Encounter: 60 minutes MD Time: 30 minutes  Ardeen Kohler, MD 08/19/2023 5:13 PM

## 2023-08-18 NOTE — Anesthesia Preprocedure Evaluation (Addendum)
 Anesthesia Evaluation  Patient identified by MRN, date of birth, ID band Patient awake    Reviewed: Allergy & Precautions, NPO status , Patient's Chart, lab work & pertinent test results, reviewed documented beta blocker date and time   History of Anesthesia Complications Negative for: history of anesthetic complications  Airway Mallampati: III  TM Distance: >3 FB Neck ROM: Full    Dental  (+) Dental Advisory Given   Pulmonary COPD   breath sounds clear to auscultation       Cardiovascular hypertension, Pt. on medications and Pt. on home beta blockers (-) angina (-) Past MI + dysrhythmias Atrial Fibrillation  Rhythm:Irregular     Neuro/Psych negative neurological ROS  negative psych ROS   GI/Hepatic Neg liver ROS,GERD  ,,  Endo/Other  negative endocrine ROS    Renal/GU negative Renal ROS     Musculoskeletal negative musculoskeletal ROS (+)    Abdominal   Peds  Hematology  (+) Blood dyscrasia eliquis    Anesthesia Other Findings   Reproductive/Obstetrics                              Anesthesia Physical Anesthesia Plan  ASA: 3  Anesthesia Plan: General   Post-op Pain Management: Minimal or no pain anticipated   Induction: Intravenous  PONV Risk Score and Plan: 3 and Ondansetron  and Dexamethasone  Airway Management Planned: Oral ETT  Additional Equipment: None  Intra-op Plan:   Post-operative Plan: Extubation in OR  Informed Consent: I have reviewed the patients History and Physical, chart, labs and discussed the procedure including the risks, benefits and alternatives for the proposed anesthesia with the patient or authorized representative who has indicated his/her understanding and acceptance.     Dental advisory given  Plan Discussed with: CRNA  Anesthesia Plan Comments:        Anesthesia Quick Evaluation

## 2023-08-18 NOTE — Progress Notes (Signed)
 MEWS Progress Note  Patient Details Name: Vicki Swanson MRN: [id number] DOB: 1945-05-04 Today's Date: 08/18/2023   MEWS Flowsheet Documentation:  Assess: MEWS Score Temp: 98.3 F (36.8 C) BP: (!) 133/90 MAP (mmHg): 99 Pulse Rate: (!) 120 ECG Heart Rate: (!) 120 Resp: 17 Level of Consciousness: Alert SpO2: 96 % O2 Device: Room Air Assess: MEWS Score MEWS Temp: 0 MEWS Systolic: 0 MEWS Pulse: 2 MEWS RR: 0 MEWS LOC: 0 MEWS Score: 2 MEWS Score Color: Yellow Assess: SIRS CRITERIA SIRS Temperature : 0 SIRS Respirations : 0 SIRS Pulse: 1 SIRS WBC: 0 SIRS Score Sum : 1 SIRS Temperature : 0 SIRS Pulse: 1 SIRS Respirations : 0 SIRS WBC: 0 SIRS Score Sum : 1 Assess: if the MEWS score is Yellow or Red Were vital signs accurate and taken at a resting state?: Yes Does the patient meet 2 or more of the SIRS criteria?: No MEWS guidelines implemented : Yes, yellow Treat MEWS Interventions: Considered administering scheduled or prn medications/treatments as ordered Take Vital Signs Increase Vital Sign Frequency : Yellow: Q2hr x1, continue Q4hrs until patient remains green for 12hrs Escalate MEWS: Escalate: Yellow: Discuss with charge nurse and consider notifying provider and/or RRT Notify: Charge Nurse/RN Name of Charge Nurse/RN Notified: Materials engineer

## 2023-08-18 NOTE — Transfer of Care (Signed)
 Immediate Anesthesia Transfer of Care Note  Patient: Vicki Swanson  Procedure(s) Performed: LEFT ATRIAL APPENDAGE OCCLUSION TRANSESOPHAGEAL ECHOCARDIOGRAM  Patient Location: PACU  Anesthesia Type:General  Level of Consciousness: awake, alert , and oriented  Airway & Oxygen Therapy: Patient Spontanous Breathing  Post-op Assessment: Report given to RN and Post -op Vital signs reviewed and stable  Post vital signs: Reviewed and stable  Last Vitals:  Vitals Value Taken Time  BP    Temp    Pulse 107 08/18/23 1305  Resp 18 08/18/23 1305  SpO2 95 % 08/18/23 1305  Vitals shown include unfiled device data.  Last Pain:  Vitals:   08/18/23 0815  TempSrc:   PainSc: 0-No pain         Complications: No notable events documented.

## 2023-08-18 NOTE — Progress Notes (Signed)
 Arrived from [facility] to [phone]. Placed on cardiac monitoring and vitals obtained.  Bedrest to end at 1645.     08/18/23 1607  Vitals  Temp 98.3 F (36.8 C)  Temp Source Oral  BP (!) 133/90  MAP (mmHg) 99  BP Location Right Arm  BP Method Automatic  Patient Position (if appropriate) Lying  Pulse Rate (!) 120  Pulse Rate Source Monitor  ECG Heart Rate (!) 120  Resp 17  Level of Consciousness  Level of Consciousness Alert  MEWS COLOR  MEWS Score Color Yellow  Oxygen Therapy  SpO2 96 %  O2 Device Room Air  Pain Assessment  Pain Scale 0-10  Pain Score 0  MEWS Score  MEWS Temp 0  MEWS Systolic 0  MEWS Pulse 2  MEWS RR 0  MEWS LOC 0  MEWS Score 2

## 2023-08-18 NOTE — Discharge Instructions (Signed)
 WATCHMANT Procedure, Care After  Procedure MD: Dr. Alene Mires Clinical Coordinator: Karsten Fells, RN  This sheet gives you information about how to care for yourself after your procedure. Your health care provider may also give you more specific instructions. If you have problems or questions, contact your health care provider.  What can I expect after the procedure? After the procedure, it is common to have: Bruising around your puncture site. Tenderness around your puncture site. Tiredness (fatigue).  Medication instructions It is very important to continue to take your blood thinner as directed by your doctor after the Watchman procedure. Call your procedure doctor's office with question or concerns. If you are on Coumadin (warfarin), you will have your INR checked the week after your procedure, with a goal INR of 2.0 - 3.0. Please follow your medication instructions on your discharge summary. Only take the medications listed on your discharge paperwork.  Follow up You will be seen in 6 weeks after your procedure You will have a repeat CT scan or Echocardiogram approximately 8 weeks after your procedure mark to check your device You will follow up the MD/APP who performed your procedure 6 months after your procedure The Watchman Clinical Coordinator will check in with you from time to time, including 1 and 2 years after your procedure.  NO DENTAL CLEANINGS FOR 45 days. After that, you will require antibiotics for dental procedures the first 6 months.   Follow these instructions at home: Puncture site care  Follow instructions from your health care provider about how to take care of your puncture site. Make sure you: If present, leave stitches (sutures), skin glue, or adhesive strips in place.  If a large square bandage is present, this may be removed 24 hours after surgery.  Check your puncture site every day for signs of infection. Check for: Redness, swelling, or pain. Fluid  or blood. If your puncture site starts to bleed, lie down on your back, apply firm pressure to the area, and contact your health care provider. Warmth. Pus or a bad smell. Driving Do not drive yourself home if you received sedation Do not drive for at least 4 days after your procedure or however long your health care provider recommends. (Do not resume driving if you have previously been instructed not to drive for other health reasons.) Do not spend greater than 1 hour at a time in a car for the first 3 days. Stop and take a break with a 5 minute walk at least every hour.  Do not drive or use heavy machinery while taking prescription pain medicine.  Activity Avoid activities that take a lot of effort, including exercise, for at least 7 days after your procedure. For the first 3 days, avoid sitting for longer than one hour at a time.  Avoid alcoholic beverages, signing paperwork, or participating in legal proceedings for 24 hours after receiving sedation Do not lift anything that is heavier than 10 lb (4.5 kg) for one week.  No sexual activity for 1 week.  Return to your normal activities as told by your health care provider. Ask your health care provider what activities are safe for you. General instructions Take over-the-counter and prescription medicines only as told by your health care provider. Do not use any products that contain nicotine or tobacco, such as cigarettes and e-cigarettes. If you need help quitting, ask your health care provider. You may shower after 24 hours, but Do not take baths, swim, or use a hot tub for  1 week.  Do not drink alcohol for 24 hours after your procedure. Keep all follow-up visits as told by your health care provider. This is important. Dental Work: You will require antibiotics prior to any dental work, including cleanings, for 6 months after your Watchman implantation to help protect you from infection. After 6 months, antibiotics are no longer  required. Contact a health care provider if: You have redness, mild swelling, or pain around your puncture site. You have soreness in your throat or at your puncture site that does not improve after several days You have fluid or blood coming from your puncture site that stops after applying firm pressure to the area. Your puncture site feels warm to the touch. You have pus or a bad smell coming from your puncture site. You have a fever. You have chest pain or discomfort that spreads to your neck, jaw, or arm. You are sweating a lot. You feel nauseous. You have a fast or irregular heartbeat. You have shortness of breath. You are dizzy or light-headed and feel the need to lie down. You have pain or numbness in the arm or leg closest to your puncture site. Get help right away if: Your puncture site suddenly swells. Your puncture site is bleeding and the bleeding does not stop after applying firm pressure to the area. These symptoms may represent a serious problem that is an emergency. Do not wait to see if the symptoms will go away. Get medical help right away. Call your local emergency services (911 in the [place] Do not drive yourself to the hospital. Summary After the procedure, it is normal to have bruising and tenderness at the puncture site in your groin, neck, or forearm. Check your puncture site every day for signs of infection. Get help right away if your puncture site is bleeding and the bleeding does not stop after applying firm pressure to the area. This is a medical emergency.  This information is not intended to replace advice given to you by your health care provider. Make sure you discuss any questions you have with your health care provider.

## 2023-08-19 ENCOUNTER — Encounter (HOSPITAL_COMMUNITY): Payer: Self-pay | Admitting: Cardiology

## 2023-08-19 ENCOUNTER — Other Ambulatory Visit: Payer: Self-pay | Admitting: Physician Assistant

## 2023-08-19 ENCOUNTER — Inpatient Hospital Stay (HOSPITAL_COMMUNITY)

## 2023-08-19 DIAGNOSIS — I4819 Other persistent atrial fibrillation: Secondary | ICD-10-CM | POA: Diagnosis not present

## 2023-08-19 DIAGNOSIS — D6869 Other thrombophilia: Secondary | ICD-10-CM | POA: Diagnosis not present

## 2023-08-19 DIAGNOSIS — E876 Hypokalemia: Secondary | ICD-10-CM

## 2023-08-19 DIAGNOSIS — I3139 Other pericardial effusion (noninflammatory): Secondary | ICD-10-CM | POA: Diagnosis not present

## 2023-08-19 DIAGNOSIS — J449 Chronic obstructive pulmonary disease, unspecified: Secondary | ICD-10-CM | POA: Diagnosis not present

## 2023-08-19 DIAGNOSIS — Z006 Encounter for examination for normal comparison and control in clinical research program: Secondary | ICD-10-CM | POA: Diagnosis not present

## 2023-08-19 LAB — BASIC METABOLIC PANEL WITH GFR
Anion gap: 7 (ref 5–15)
BUN: 12 mg/dL (ref 8–23)
CO2: 18 mmol/L — ABNORMAL LOW (ref 22–32)
Calcium: 8.3 mg/dL — ABNORMAL LOW (ref 8.9–10.3)
Chloride: 116 mmol/L — ABNORMAL HIGH (ref 98–111)
Creatinine, Ser: 1.25 mg/dL — ABNORMAL HIGH (ref 0.44–1.00)
GFR, Estimated: 44 mL/min — ABNORMAL LOW (ref >60)
Glucose, Bld: 96 mg/dL (ref 70–99)
Potassium: 3.4 mmol/L — ABNORMAL LOW (ref 3.5–5.1)
Sodium: 141 mmol/L (ref 135–145)

## 2023-08-19 LAB — ECHOCARDIOGRAM LIMITED
Height: 62 in
S' Lateral: 3.7 cm
Weight: 2240 [oz_av]

## 2023-08-19 LAB — MAGNESIUM: Magnesium: 1.5 mg/dL — ABNORMAL LOW (ref 1.7–2.4)

## 2023-08-19 LAB — CBC
HCT: 32.7 % — ABNORMAL LOW (ref 36.0–46.0)
Hemoglobin: 11.2 g/dL — ABNORMAL LOW (ref 12.0–15.0)
MCH: 31.9 pg (ref 26.0–34.0)
MCHC: 34.3 g/dL (ref 30.0–36.0)
MCV: 93.2 fL (ref 80.0–100.0)
Platelets: 120 10*3/uL — ABNORMAL LOW (ref 150–400)
RBC: 3.51 MIL/uL — ABNORMAL LOW (ref 3.87–5.11)
RDW: 14.4 % (ref 11.5–15.5)
WBC: 8.2 10*3/uL (ref 4.0–10.5)
nRBC: 0 % (ref 0.0–0.2)

## 2023-08-19 MED ORDER — DILTIAZEM HCL ER COATED BEADS 120 MG PO CP24
120.0000 mg | ORAL_CAPSULE | Freq: Every day | ORAL | Status: DC
Start: 2023-08-19 — End: 2023-08-19
  Administered 2023-08-19: 120 mg via ORAL
  Filled 2023-08-19: qty 1

## 2023-08-19 MED ORDER — APIXABAN 2.5 MG PO TABS
2.5000 mg | ORAL_TABLET | Freq: Two times a day (BID) | ORAL | Status: DC
  Administered 2023-08-19: 2.5 mg via ORAL
  Filled 2023-08-19: qty 1

## 2023-08-19 MED ORDER — POTASSIUM CHLORIDE CRYS ER 20 MEQ PO TBCR
40.0000 meq | EXTENDED_RELEASE_TABLET | Freq: Once | ORAL | Status: AC
  Administered 2023-08-19: 40 meq via ORAL
  Filled 2023-08-19: qty 2

## 2023-08-19 MED ORDER — METOPROLOL SUCCINATE ER 100 MG PO TB24
100.0000 mg | ORAL_TABLET | Freq: Two times a day (BID) | ORAL | Status: DC
  Administered 2023-08-19: 100 mg via ORAL
  Filled 2023-08-19: qty 1

## 2023-08-19 MED ORDER — MAGNESIUM SULFATE 4 GM/100ML IV SOLN
4.0000 g | Freq: Once | INTRAVENOUS | Status: AC
  Administered 2023-08-19: 4 g via INTRAVENOUS
  Filled 2023-08-19: qty 100

## 2023-08-19 NOTE — Progress Notes (Signed)
 Rounding Note    Patient Name: Vicki Swanson Date of Encounter: 08/19/2023  [facility] Cardiologist:   Subjective   Feels well, no CP, SOB  Inpatient Medications    Scheduled Meds:  apixaban   2.5 mg Oral BID   diltiazem   120 mg Oral Daily   metoprolol  succinate  100 mg Oral BID   rosuvastatin   10 mg Oral Daily   sertraline   150 mg Oral QHS   sodium chloride  flush  3 mL Intravenous Q12H   topiramate   50 mg Oral BID   Continuous Infusions:  sodium chloride      PRN Meds: sodium chloride , acetaminophen , ondansetron  (ZOFRAN ) IV, sodium chloride  flush   Vital Signs    Vitals:   08/19/23 0047 08/19/23 0223 08/19/23 0440 08/19/23 0728  BP: 92/66 (!) 86/63 99/65 107/83  Pulse:   95 76  Resp: 20  16 18   Temp:   98.8 F (37.1 C) 98.4 F (36.9 C)  TempSrc:   Oral Oral  SpO2:   97% 97%  Weight:      Height:        Intake/Output Summary (Last 24 hours) at 08/19/2023 1050 Last data filed at 08/19/2023 0747 Gross per 24 hour  Intake 1723 ml  Output 20 ml  Net 1703 ml      08/18/2023    7:51 AM 08/09/2023    2:07 PM 05/06/2023    9:05 AM  Last 3 Weights  Weight (lbs) 140 lb 139 lb 12.8 oz 146 lb  Weight (kg) 63.504 kg 63.413 kg 66.225 kg      Telemetry    AFib 80's mostly - Personally Reviewed  ECG    AFib  96bpm - Personally Reviewed  Physical Exam   GEN: No acute distress.   Neck: No JVD Cardiac: irreg-irreg, no murmurs, rubs, or gallops.  Respiratory: CTA b/l. GI: Soft, nontender, non-distended  MS: No edema; No deformity. Neuro:  Nonfocal  Psych: Normal affect   Groin site is stable, no bleeding, hematoma  Labs    High Sensitivity Troponin:  No results for input(s): "TROPONINIHS" in the last 720 hours.   Chemistry Recent Labs  Lab 08/19/23 0437  NA 141  K 3.4*  CL 116*  CO2 18*  GLUCOSE 96  BUN 12  CREATININE 1.25*  CALCIUM  8.3*  MG 1.5*  GFRNONAA 44*  ANIONGAP 7    Lipids No results for input(s): "CHOL", "TRIG", "HDL",  "LABVLDL", "LDLCALC", "CHOLHDL" in the last 168 hours.  Hematology Recent Labs  Lab 08/19/23 0437  WBC 8.2  RBC 3.51*  HGB 11.2*  HCT 32.7*  MCV 93.2  MCH 31.9  MCHC 34.3  RDW 14.4  PLT 120*   Thyroid No results for input(s): "TSH", "FREET4" in the last 168 hours.  BNPNo results for input(s): "BNP", "PROBNP" in the last 168 hours.  DDimer No results for input(s): "DDIMER" in the last 168 hours.   Radiology    ECHO TEE Result Date: 08/18/2023    TRANSESOPHOGEAL ECHO REPORT   Patient Name:   Vicki Swanson Date of Exam: 08/18/2023 Medical Rec #:  [id number]      Height:       62.0 in Accession #:    [id number]     Weight:       140.0 lb Date of Birth:  1946/03/05      BSA:          1.643 m Patient Age:    78 years  BP:           124/92 mmHg Patient Gender: F              HR:           125 bpm. Exam Location:  Inpatient Procedure: Transesophageal Echo, 3D Echo, Cardiac Doppler and Color Doppler            (Both Spectral and Color Flow Doppler were utilized during            procedure). Indications:    Watchman  History:        Patient has prior history of Echocardiogram examinations, most                 recent 03/18/2023. COPD, Arrythmias:Atrial Fibrillation; Risk                 Factors:Hypertension.  Sonographer:    Astrid Blamer Referring Phys: [id number] JOSHUA PARKER PROCEDURE: After discussion of the risks and benefits of a TEE, an informed consent was obtained from the patient. The transesophogeal probe was passed without difficulty through the esophogus of the patient. Sedation performed by different physician. Image quality was excellent. The patient's vital signs; including heart rate, blood pressure, and oxygen saturation; remained stable throughout the procedure. The patient developed no complications during the procedure.  IMPRESSIONS  1. TEE guided LAA closure with 24 mm Watchman FLX. 18% compression. No device leak. A small iatrogenic ASD was present after the case with L to R  shunting. A small pericardial effusion was present before and after the case. 3D assessment of the LAA was performed and confirmed correct placement and no device leak.  2. Left ventricular ejection fraction, by estimation, is 50 to 55%. The left ventricle has low normal function.  3. Right ventricular systolic function is normal. The right ventricular size is normal.  4. Left atrial size was mild to moderately dilated. No left atrial/left atrial appendage thrombus was detected.  5. Right atrial size was mildly dilated.  6. A small pericardial effusion is present. The pericardial effusion is anterior to the right ventricle.  7. The mitral valve is grossly normal. Mild to moderate mitral valve regurgitation. No evidence of mitral stenosis.  8. The aortic valve is tricuspid. Aortic valve regurgitation is not visualized. No aortic stenosis is present.  9. There is mild (Grade II) layered plaque involving the descending aorta. 10. 3D performed of the LAA and demonstrates 3D assessment of LAA was normal. FINDINGS  Left Ventricle: Left ventricular ejection fraction, by estimation, is 50 to 55%. The left ventricle has low normal function. The left ventricular internal cavity size was normal in size. Right Ventricle: The right ventricular size is normal. No increase in right ventricular wall thickness. Right ventricular systolic function is normal. Left Atrium: Left atrial size was mild to moderately dilated. No left atrial/left atrial appendage thrombus was detected. Right Atrium: Right atrial size was mildly dilated. Pericardium: A small pericardial effusion is present. The pericardial effusion is anterior to the right ventricle. Mitral Valve: The mitral valve is grossly normal. Mild to moderate mitral valve regurgitation. No evidence of mitral valve stenosis. Tricuspid Valve: The tricuspid valve is grossly normal. Tricuspid valve regurgitation is trivial. No evidence of tricuspid stenosis. Aortic Valve: The aortic valve  is tricuspid. Aortic valve regurgitation is not visualized. No aortic stenosis is present. Pulmonic Valve: The pulmonic valve was grossly normal. Pulmonic valve regurgitation is not visualized. No evidence of pulmonic stenosis. Aorta: The aortic root  and ascending aorta are structurally normal, with no evidence of dilitation. There is mild (Grade II) layered plaque involving the descending aorta. IAS/Shunts: The atrial septum is grossly normal. Additional Comments: 3D was performed not requiring image post processing on an independent workstation and was normal.  AORTA Ao Root diam: 2.88 cm Jackquelyn Mass MD Electronically signed by Jackquelyn Mass MD Signature Date/Time: 08/18/2023/6:01:33 PM    Final     Cardiac Studies   Quick look bedside echo by Dr. Daneil Dunker this morning  negative for large effusion or tamponade physiology  Patient Profile     78 y.o. female w/PMHx of  HTN, HLD, OSA, stroke, and AFib admitted yesterday for LAAO implant procedure  Held over night for AFib w/RVR and rate control  Assessment & Plan    Persistent AFib CHA2DS2Vasc is 6, on Eliquis  S/p Watchman implant yesterday Held for RVR > rate control Hypotension noted overnight Quick look echo this morning bedside by Dr. Daneil Dunker with no significant effusion noted Limited echo ordered to r/o officially  Plan to resume OAC once echo completed if neg for change in known baseline effusion   HTN Relative lower BPs reported at home, 100's-120's Will plan to stop her amlodipine , lisinopril  Resume home toprol /dilt pending echo   Dr. Daneil Dunker has reviewed limited echo No large effusion or tamponade physiology > resumed: Eliquis  Toprol  Diltiazem   Plan to monitor through the day If stable on med perhaps discharge later the afternoon vs tomorrow  For questions or updates, please contact  [facility] Please consult [email or web address] for contact info under        Signed, Debbie Fails, PA-C  08/19/2023, 10:50 AM

## 2023-08-19 NOTE — Anesthesia Postprocedure Evaluation (Signed)
 Anesthesia Post Note  Patient: Vicki Swanson  Procedure(s) Performed: LEFT ATRIAL APPENDAGE OCCLUSION TRANSESOPHAGEAL ECHOCARDIOGRAM     Patient location during evaluation: Cath Lab Anesthesia Type: General Level of consciousness: awake and alert Pain management: pain level controlled Vital Signs Assessment: post-procedure vital signs reviewed and stable Respiratory status: spontaneous breathing, nonlabored ventilation and respiratory function stable Cardiovascular status: blood pressure returned to baseline and stable Postop Assessment: no apparent nausea or vomiting Anesthetic complications: no   No notable events documented.                  Dauntae Derusha

## 2023-08-19 NOTE — Progress Notes (Signed)
  Echocardiogram 2D Echocardiogram has been performed.  Vicki Swanson 08/19/2023, 8:41 AM

## 2023-08-19 NOTE — Progress Notes (Signed)
 Nursing Discharge Note   Name: Vicki Swanson MRN: [id number] DOB: 14-Jan-1946    Admit Date:  08/18/2023  Discharge Date:  08/19/2023   Vicki Swanson is to be discharged home per MD order.  AVS completed. Reviewed with patient and family at bedside. Highlighted copy provided for patient to take home.  Patient/caregiver able to verbalize understanding of discharge instructions. PIV removed. Patient stable upon discharge.     Discharge Instructions       [facility] Procedure, Care After  Procedure MD: Dr. Turner Gains Clinical Coordinator: Larkin Plumb, RN  This sheet gives you information about how to care for yourself after your procedure. Your health care provider may also give you more specific instructions. If you have problems or questions, contact your health care provider.  What can I expect after the procedure? After the procedure, it is common to have: Bruising around your puncture site. Tenderness around your puncture site. Tiredness (fatigue).  Medication instructions It is very important to continue to take your blood thinner as directed by your doctor after the Watchman procedure. Call your procedure doctor's office with question or concerns. If you are on Coumadin (warfarin), you will have your INR checked the week after your procedure, with a goal INR of 2.0 - 3.0. Please follow your medication instructions on your discharge summary. Only take the medications listed on your discharge paperwork.  Follow up You will be seen in 6 weeks after your procedure You will have a repeat CT scan or Echocardiogram approximately 8 weeks after your procedure mark to check your device You will follow up the MD/APP who performed your procedure 6 months after your procedure The Watchman Clinical Coordinator will check in with you from time to time, including 1 and 2 years after your procedure.  NO DENTAL CLEANINGS FOR 45 days. After that, you will require antibiotics for dental  procedures the first 6 months.   Follow these instructions at home: Puncture site care  Follow instructions from your health care provider about how to take care of your puncture site. Make sure you: If present, leave stitches (sutures), skin glue, or adhesive strips in place.  If a large square bandage is present, this may be removed 24 hours after surgery.  Check your puncture site every day for signs of infection. Check for: Redness, swelling, or pain. Fluid or blood. If your puncture site starts to bleed, lie down on your back, apply firm pressure to the area, and contact your health care provider. Warmth. Pus or a bad smell. Driving Do not drive yourself home if you received sedation Do not drive for at least 4 days after your procedure or however long your health care provider recommends. (Do not resume driving if you have previously been instructed not to drive for other health reasons.) Do not spend greater than 1 hour at a time in a car for the first 3 days. Stop and take a break with a 5 minute walk at least every hour.  Do not drive or use heavy machinery while taking prescription pain medicine.  Activity Avoid activities that take a lot of effort, including exercise, for at least 7 days after your procedure. For the first 3 days, avoid sitting for longer than one hour at a time.  Avoid alcoholic beverages, signing paperwork, or participating in legal proceedings for 24 hours after receiving sedation Do not lift anything that is heavier than 10 lb (4.5 kg) for one week.  No sexual activity for  1 week.  Return to your normal activities as told by your health care provider. Ask your health care provider what activities are safe for you. General instructions Take over-the-counter and prescription medicines only as told by your health care provider. Do not use any products that contain nicotine or tobacco, such as cigarettes and e-cigarettes. If you need help quitting, ask your  health care provider. You may shower after 24 hours, but Do not take baths, swim, or use a hot tub for 1 week.  Do not drink alcohol for 24 hours after your procedure. Keep all follow-up visits as told by your health care provider. This is important. Dental Work: You will require antibiotics prior to any dental work, including cleanings, for 6 months after your Watchman implantation to help protect you from infection. After 6 months, antibiotics are no longer required. Contact a health care provider if: You have redness, mild swelling, or pain around your puncture site. You have soreness in your throat or at your puncture site that does not improve after several days You have fluid or blood coming from your puncture site that stops after applying firm pressure to the area. Your puncture site feels warm to the touch. You have pus or a bad smell coming from your puncture site. You have a fever. You have chest pain or discomfort that spreads to your neck, jaw, or arm. You are sweating a lot. You feel nauseous. You have a fast or irregular heartbeat. You have shortness of breath. You are dizzy or light-headed and feel the need to lie down. You have pain or numbness in the arm or leg closest to your puncture site. Get help right away if: Your puncture site suddenly swells. Your puncture site is bleeding and the bleeding does not stop after applying firm pressure to the area. These symptoms may represent a serious problem that is an emergency. Do not wait to see if the symptoms will go away. Get medical help right away. Call your local emergency services (911 in the [place] Do not drive yourself to the hospital. Summary After the procedure, it is normal to have bruising and tenderness at the puncture site in your groin, neck, or forearm. Check your puncture site every day for signs of infection. Get help right away if your puncture site is bleeding and the bleeding does not stop after applying  firm pressure to the area. This is a medical emergency.  This information is not intended to replace advice given to you by your health care provider. Make sure you discuss any questions you have with your health care provider.        Discharge Instructions     Call MD for:  persistant dizziness or light-headedness   Complete by: As directed    Call MD for:  redness, tenderness, or signs of infection (pain, swelling, redness, odor or green/yellow discharge around incision site)   Complete by: As directed    Call MD for:  temperature >100.4   Complete by: As directed    Diet - low sodium heart healthy   Complete by: As directed    Increase activity slowly   Complete by: As directed    Increase activity slowly   Complete by: As directed         Allergies as of 08/19/2023   No Known Allergies      Medication List     STOP taking these medications    amLODipine  5 MG tablet Commonly known as: NORVASC   lisinopril  20 MG tablet Commonly known as: ZESTRIL        TAKE these medications    apixaban  2.5 MG Tabs tablet Commonly known as: ELIQUIS  Take 1 tablet (2.5 mg total) by mouth 2 (two) times daily.   b complex vitamins capsule Take 1 capsule by mouth daily.   diltiazem  120 MG 24 hr capsule Commonly known as: CARDIZEM  CD Take 1 capsule (120 mg total) by mouth daily.   metoprolol  succinate 100 MG 24 hr tablet Commonly known as: TOPROL -XL Take 1 tablet (100 mg total) by mouth in the morning and at bedtime. Take with or immediately following a meal.   rosuvastatin  10 MG tablet Commonly known as: CRESTOR  Take 10 mg by mouth daily.   sertraline  100 MG tablet Commonly known as: ZOLOFT  Take 150 mg by mouth at bedtime.   topiramate  25 MG tablet Commonly known as: TOPAMAX  Take 50 mg by mouth 2 (two) times daily.   VITAMIN C PO Take 2,000 mg by mouth daily.   Vitamin D3 50 MCG (2000 UT) capsule Take 2,000 Units by mouth daily.         Discharge  Instructions/ Education: An After Visit Summary was printed and given to the patient. Discharge instructions given to patient/family with verbalized understanding. Discharge education completed with patient/family including: follow up instructions, medication list, discharge activities, and limitations if indicated.  Additional discharge instructions as indicated by discharging provider also reviewed.  Patient and family able to verbalize understanding, all questions fully answered. Patient instructed to return to Emergency Department, call 911, or call MD for any changes in condition.   Patient escorted via wheelchair to lobby and discharged home via private automobile.

## 2023-08-19 NOTE — Plan of Care (Signed)
 Discharging home with husband at bedside.   Problem: Education: Goal: Knowledge of cardiac device and self-care will improve Outcome: Adequate for Discharge Goal: Ability to safely manage health related needs after discharge will improve Outcome: Adequate for Discharge Goal: Individualized Educational Video(s) Outcome: Adequate for Discharge   Problem: Cardiac: Goal: Ability to achieve and maintain adequate cardiopulmonary perfusion will improve Outcome: Adequate for Discharge   Problem: Education: Goal: Knowledge of General Education information will improve Description: Including pain rating scale, medication(s)/side effects and non-pharmacologic comfort measures Outcome: Adequate for Discharge   Problem: Health Behavior/Discharge Planning: Goal: Ability to manage health-related needs will improve Outcome: Adequate for Discharge   Problem: Clinical Measurements: Goal: Ability to maintain clinical measurements within normal limits will improve Outcome: Adequate for Discharge Goal: Will remain free from infection Outcome: Adequate for Discharge Goal: Diagnostic test results will improve Outcome: Adequate for Discharge Goal: Respiratory complications will improve Outcome: Adequate for Discharge Goal: Cardiovascular complication will be avoided Outcome: Adequate for Discharge   Problem: Activity: Goal: Risk for activity intolerance will decrease Outcome: Adequate for Discharge   Problem: Nutrition: Goal: Adequate nutrition will be maintained Outcome: Adequate for Discharge   Problem: Coping: Goal: Level of anxiety will decrease Outcome: Adequate for Discharge   Problem: Elimination: Goal: Will not experience complications related to bowel motility Outcome: Adequate for Discharge Goal: Will not experience complications related to urinary retention Outcome: Adequate for Discharge   Problem: Pain Managment: Goal: General experience of comfort will improve and/or be  controlled Outcome: Adequate for Discharge   Problem: Safety: Goal: Ability to remain free from injury will improve Outcome: Adequate for Discharge   Problem: Skin Integrity: Goal: Risk for impaired skin integrity will decrease Outcome: Adequate for Discharge   Problem: Education: Goal: Understanding of disease, treatment, and recovery process will improve Outcome: Adequate for Discharge   Problem: Activity: Goal: Ability to return to baseline activity level will improve Outcome: Adequate for Discharge   Problem: Cardiac: Goal: Ability to maintain adequate cardiovascular perfusion will improve Outcome: Adequate for Discharge Goal: Vascular access site(s) Level 0-1 will be maintained Outcome: Adequate for Discharge   Problem: Health Behavior/ Discharge Planning: Goal: Ability to safely manage health related needs after discharge Outcome: Adequate for Discharge

## 2023-08-22 ENCOUNTER — Telehealth: Payer: Self-pay

## 2023-08-22 NOTE — Telephone Encounter (Signed)
 Post-Watchman call attempted. Left message to call back.  Will confirm the patient is coming in for labs this week (mag and K replaced) Will schedule cCT Will review SBE

## 2023-08-23 NOTE — Telephone Encounter (Signed)
 Left message to call back

## 2023-08-24 NOTE — Telephone Encounter (Signed)
 Left message to call back

## 2023-08-25 ENCOUNTER — Telehealth: Payer: Self-pay | Admitting: Physician Assistant

## 2023-08-25 DIAGNOSIS — I4819 Other persistent atrial fibrillation: Secondary | ICD-10-CM

## 2023-08-25 DIAGNOSIS — Z95818 Presence of other cardiac implants and grafts: Secondary | ICD-10-CM

## 2023-08-25 NOTE — Telephone Encounter (Signed)
 Pt recently underwent LAAO. She was noted to have electrolyte abnormalities and repeat labs were recommended. I tried calling her and her husband without answer. Left a VM to return the call.   Abagail Hoar PA-C  [facility]

## 2023-08-31 DIAGNOSIS — I2089 Other forms of angina pectoris: Secondary | ICD-10-CM | POA: Diagnosis not present

## 2023-08-31 DIAGNOSIS — N1832 Chronic kidney disease, stage 3b: Secondary | ICD-10-CM | POA: Diagnosis not present

## 2023-08-31 DIAGNOSIS — S065XAA Traumatic subdural hemorrhage with loss of consciousness status unknown, initial encounter: Secondary | ICD-10-CM | POA: Diagnosis not present

## 2023-08-31 DIAGNOSIS — R400 Somnolence: Secondary | ICD-10-CM | POA: Diagnosis not present

## 2023-08-31 NOTE — Telephone Encounter (Addendum)
 The patient's husband returned call. He reported the patient has been fine since discharge and had no issues with her groin site. Confirmed 6 week visit. Informed them the patient will get SBE ordered at that visit for dental visits. Reminded them to go for lab draw. They are going this afternoon. They were grateful for call and agreed with plan.

## 2023-08-31 NOTE — Telephone Encounter (Signed)
 Attempted call again. No answer and no VM on EC number.  Will try again later.

## 2023-09-01 NOTE — Addendum Note (Signed)
 Addended by: Raychell Holcomb A on: 09/01/2023 09:51 AM   Modules accepted: Orders

## 2023-09-02 ENCOUNTER — Telehealth: Payer: Self-pay

## 2023-09-02 NOTE — Telephone Encounter (Signed)
 Post-Watchman lab results from [facility] dated 08/31/2023: Magnesium :  2.0 Creatinine 1.29 Potassium: 3.9

## 2023-09-02 NOTE — Telephone Encounter (Signed)
 Spoke with the patient's husband, who reported the patient had labs drawn at [facility].  Called [facility] at [phone] and spoke with [facility]. Requested most recent labs.

## 2023-09-02 NOTE — Telephone Encounter (Signed)
 Despite the patient saying she was getting blood work drawn a couple days ago, no results are in the system or Care Everywhere. Called to confirm the patient had labs drawn and to request those results if so.   Left message to call back. If she did not get her labs drawn, she will be instructed again to go.

## 2023-09-19 ENCOUNTER — Ambulatory Visit: Payer: Self-pay | Admitting: Cardiology

## 2023-09-19 ENCOUNTER — Ambulatory Visit (INDEPENDENT_AMBULATORY_CARE_PROVIDER_SITE_OTHER): Payer: Medicare Other

## 2023-09-19 DIAGNOSIS — I639 Cerebral infarction, unspecified: Secondary | ICD-10-CM

## 2023-09-19 LAB — CUP PACEART REMOTE DEVICE CHECK
Date Time Interrogation Session: 20250518230833
Implantable Pulse Generator Implant Date: 20211215

## 2023-09-29 NOTE — Addendum Note (Signed)
 Addended by: Edra Govern D on: 09/29/2023 09:25 AM   Modules accepted: Orders

## 2023-09-29 NOTE — Progress Notes (Signed)
 [facility] Summary Report / Loop Recorder

## 2023-09-30 ENCOUNTER — Encounter: Payer: Self-pay | Admitting: Physician Assistant

## 2023-09-30 ENCOUNTER — Ambulatory Visit: Attending: Physician Assistant | Admitting: Physician Assistant

## 2023-09-30 VITALS — BP 116/74 | HR 89 | Ht 62.0 in | Wt 137.0 lb

## 2023-09-30 DIAGNOSIS — D6869 Other thrombophilia: Secondary | ICD-10-CM | POA: Diagnosis not present

## 2023-09-30 DIAGNOSIS — I4819 Other persistent atrial fibrillation: Secondary | ICD-10-CM | POA: Diagnosis not present

## 2023-09-30 DIAGNOSIS — Z95818 Presence of other cardiac implants and grafts: Secondary | ICD-10-CM

## 2023-09-30 DIAGNOSIS — I4811 Longstanding persistent atrial fibrillation: Secondary | ICD-10-CM | POA: Diagnosis not present

## 2023-09-30 DIAGNOSIS — I10 Essential (primary) hypertension: Secondary | ICD-10-CM | POA: Diagnosis not present

## 2023-09-30 DIAGNOSIS — R0789 Other chest pain: Secondary | ICD-10-CM

## 2023-09-30 MED ORDER — CLOPIDOGREL BISULFATE 75 MG PO TABS: 75.0000 mg | ORAL_TABLET | Freq: Every day | ORAL | 3 refills | Status: DC

## 2023-09-30 MED ORDER — ASPIRIN 81 MG PO TBEC: 81.0000 mg | DELAYED_RELEASE_TABLET | Freq: Every day | ORAL | Status: AC

## 2023-09-30 MED ORDER — AMOXICILLIN 500 MG PO CAPS: 2000.0000 mg | ORAL_CAPSULE | Freq: Once | ORAL | 2 refills | Status: AC

## 2023-09-30 NOTE — Patient Instructions (Signed)
 Medication Instructions:   START TAKING:  ASPIRIN  81 MG ONCE A DAY   START TAKING: PLAVIX  75 MG ONCE A DAY   ONLY ONE HOUR PRIOR TO DENTAL WORK: AMOXICILLIN  500 MG TABLET  TAKE  4 TABLETS (2000 MG ) ONE DOSE   STOP TAKING AND REMOVE THIS MEDICATION FROM YOUR MEDICATION LIST:  ELIQUIS  ( LAST DOSE 10-02-23)   *If you need a refill on your cardiac medications before your next appointment, please call your pharmacy*    Lab Work:  PLEASE GO DOWN STAIRS  [facility]  FIRST FLOOR   ( GET OFF ELEVATORS WALK TOWARDS WAITING AREA LAB LOCATED BY PHARMACY):  BMET    If you have labs (blood work) drawn today and your tests are completely normal, you will receive your results only by: MyChart Message (if you have MyChart) OR A paper copy in the mail If you have any lab test that is abnormal or we need to change your treatment, we will call you to review the results.    Testing/Procedures: NONE ORDERED  TODAY    Follow-Up: At [facility] you and your health needs are our priority.  As part of our continuing mission to provide you with exceptional heart care, our providers are all part of one team.  This team includes your primary Cardiologist (physician) and Advanced Practice Providers or APPs (Physician Assistants and Nurse Practitioners) who all work together to provide you with the care you need, when you need it.  Your next appointment:     5 month(s) ( CONTACT  CASSIE HALL/ ANGELINE HAMMER FOR EP SCHEDULING ISSUES )     Provider:     You may see Ardeen Kohler, MD or one of the following Advanced Practice Providers on your designated Care Team:   Mertha Abrahams, [place]   We recommend signing up for the patient portal called "MyChart".  Sign up information is provided on this After Visit Summary.  MyChart is used to connect with patients for Virtual Visits (Telemedicine).  Patients are able to view lab/test results, encounter notes, upcoming appointments, etc.  Non-urgent  messages can be sent to your provider as well.   To learn more about what you can do with MyChart, go to [email or web address].     Other Instructions

## 2023-09-30 NOTE — Progress Notes (Signed)
 Cardiology Office Note:  .   Date:  09/30/2023  ID:  Vicki Swanson, DOB [birth year], MRN [id number] PCP: Dannie Duval, DO  [facility] Providers Cardiologist:  None Electrophysiologist:  Ardeen Kohler, MD {  History of Present Illness: .   Vicki Swanson is a 78 y.o. female w/PMHx of  HTN, COPD, GERD, OSA (intolerant/noncompliant w/CPAP) Stroke > ILR > AFib  Aug 2024 mechanical fall resulting in a SDH Husband reported several falls Eliquis  held In out pt f/u with neurology > recommended she remain off OAC  Saw the AFib clnic 01/18/23 > persistent AFib, off OAC, Toprol  increased for rate control, introduced idea of watchman and referred to Dr. Daneil Dunker  Today's visit is scheduled as her pre-watchman visit  She saw Dr. Daneil Dunker 02/18/23 Had not fallen again thankfully HAS-BLED score 5  CHA2DS2-VASc Score = 6  He spoke with the patient's neurologist, Dr. Annamary Barrio, and she states that the patient would be eligible for short-term anti-coagulation, minimizing duration as much as possible to limit risk. We will get a CT scan for Watchman and if anatomy is feasible for closure then start Eliquis  for 3-4 weeks leading up to implant.    Saw the AFib clinic  a couple times, last 05/06/23, rates better  I saw her 08/09/23 > pre-procedure Rates were poorly controlled Her amlodipine  stopped > dilt  S/p LAAO on 08/18/23 Stayed overnight w/RVR She had some transient hypotension overnight, limited echo was stable w/o new or significant effusion OAC resumed > BP back to her baseline and rates better Discharged 08/19/23   Today is her ~6 week post watchman visit ROS:   CT scan is scheduled for 10/20/23  She comes today accompanied by her husband She mentions ongoing CP, back pain, this is chronic and unchanged Generally a constant ache, might wax/eane, noted more when in bed/at rest, not otherwise distracted Her husband reports this goes back some time No change in behavior    No groin/site concerns No palpitations No SOB No near syncope or syncope   Device information 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 LImited exam.   2. Left ventricular ejection fraction, by estimation, is 55 to 60%. The  left ventricle has normal function.   3. Right ventricular systolic function is low normal. The right  ventricular size is normal. There is normal pulmonary artery systolic  pressure.   4. The mitral valve is normal in structure. Mild mitral valve  regurgitation.   5. The inferior vena cava is normal in size with greater than 50%  respiratory variability, suggesting right atrial pressure of 3 mmHg.   Pericardium: There is no evidence of pericardial effusion.   05/31/23: Cardiac CT IMPRESSION: 1. The left atrial appendage is a large chicken wing morphology. 2. A 27mm Watchman FLX device is recommended based on the above landing zone measurements (22.5 mm maximum diameter; 23% compression). 3. There is no thrombus in the left atrial appendage on delayed imaging. 4. An inferior posterior IAS puncture site is recommended. 5. Optimal deployment angle: RAO 46 CAU 13 6. Normal coronary origin. Right dominance. Coronary artery calcium  score is 196, which is 66th percentile for age and sex matched peers.   03/18/23: TTE  1.  Left ventricular ejection fraction, by estimation, is 50%. The left  ventricle has mildly decreased function. The left ventricle demonstrates  global hypokinesis. Left ventricular diastolic parameters are  indeterminate.   2. Right ventricular systolic function is mildly reduced. The right  ventricular size is normal. There is normal pulmonary artery systolic  pressure. The estimated right  ventricular systolic pressure is 33.9 mmHg.   3. Left atrial size was mildly dilated.   4. Right atrial size was mildly dilated.   5. The mitral valve is normal in structure. No evidence of mitral valve  regurgitation. No evidence of mitral stenosis.   6. Tricuspid valve regurgitation is mild to moderate.   7. The aortic valve is tricuspid. There is mild calcification of the  aortic valve. Aortic valve regurgitation is not visualized. No aortic  stenosis is present.   8. The inferior vena cava is normal in size with greater than 50%  respiratory variability, suggesting right atrial pressure of 3 mmHg.   9. The patient was in atrial fibrillation.    Nuclear Stress 01/07/21:  LVEF 69%.  Normal study.  No evidence of ischemia or infarction.   Echo 04/14/20:  LVEF 65 to 70%.  Grade 2 diastolic dysfunction. Normal RV size and function. Mildly dilated left atrium. Mildly dilated right atrium. Aortic valve sclerosis without stenosis.   Risk Assessment/Calculations:    Physical Exam:   VS:  There were no vitals taken for this visit.   Wt Readings from Last 3 Encounters:  08/18/23 140 lb (63.5 kg)  08/09/23 139 lb 12.8 oz (63.4 kg)  05/06/23 146 lb (66.2 kg)    GEN: Well nourished, well developed in no acute distress NECK: No JVD; No carotid bruits CARDIAC: irreg-irreg, tachycardic, no murmurs, rubs, gallops RESPIRATORY:  CTA b/l without rales, wheezing or rhonchi  ABDOMEN: Soft, non-tender, non-distended EXTREMITIES: No edema; No deformity   ASSESSMENT AND PLAN: .    Longstanding persistent AFib CHA2DS2Vasc is 6, on Eliquis   Now s/p LAAO on 08/18/23 Rate controlled ILR rates generally down trending towards 80's or so  CT scheduled fro 10/20/23 stop Eliquis  last day 10/02/23 > Plavix  and ASA on 10/03/23 dental prophylaxis discussed for 6 mo, Rx provided amoxicillin (AMOXIL) 500 MG tablet Sig: Take 4 tablets (2,000 mg total) by mouth as needed. Take 1 hour prior to Dental work.    HTN Looks good  Secondary hypercoagulable state 2/2 AFib  4. CP Sounds atypical, chronic and mostly noted at rest Neg stress test 2022 05/31/23 CT: Normal coronary origin. Right dominance. Coronary artery calcium  score is 196, which is 66th percentile for age and sex matched Peers     Dispo: back in ~ 5 mo, sooner if needed  Signed, Debbie Fails, PA-C

## 2023-10-01 LAB — BASIC METABOLIC PANEL WITH GFR
BUN/Creatinine Ratio: 13 (ref 12–28)
BUN: 15 mg/dL (ref 8–27)
CO2: 19 mmol/L — ABNORMAL LOW (ref 20–29)
Calcium: 9.4 mg/dL (ref 8.7–10.3)
Chloride: 109 mmol/L — ABNORMAL HIGH (ref 96–106)
Creatinine, Ser: 1.2 mg/dL — ABNORMAL HIGH (ref 0.57–1.00)
Glucose: 94 mg/dL (ref 70–99)
Potassium: 4.2 mmol/L (ref 3.5–5.2)
Sodium: 144 mmol/L (ref 134–144)
eGFR: 46 mL/min/{1.73_m2} — ABNORMAL LOW (ref >59)

## 2023-10-03 ENCOUNTER — Ambulatory Visit: Payer: Self-pay | Admitting: Physician Assistant

## 2023-10-13 DIAGNOSIS — N1832 Chronic kidney disease, stage 3b: Secondary | ICD-10-CM | POA: Diagnosis not present

## 2023-10-13 DIAGNOSIS — T782XXA Anaphylactic shock, unspecified, initial encounter: Secondary | ICD-10-CM | POA: Diagnosis not present

## 2023-10-13 DIAGNOSIS — G9341 Metabolic encephalopathy: Secondary | ICD-10-CM | POA: Diagnosis not present

## 2023-10-13 DIAGNOSIS — I129 Hypertensive chronic kidney disease with stage 1 through stage 4 chronic kidney disease, or unspecified chronic kidney disease: Secondary | ICD-10-CM | POA: Diagnosis not present

## 2023-10-13 DIAGNOSIS — N179 Acute kidney failure, unspecified: Secondary | ICD-10-CM | POA: Diagnosis not present

## 2023-10-13 DIAGNOSIS — N390 Urinary tract infection, site not specified: Secondary | ICD-10-CM | POA: Diagnosis not present

## 2023-10-13 DIAGNOSIS — Z9049 Acquired absence of other specified parts of digestive tract: Secondary | ICD-10-CM | POA: Diagnosis not present

## 2023-10-13 DIAGNOSIS — T380X5A Adverse effect of glucocorticoids and synthetic analogues, initial encounter: Secondary | ICD-10-CM | POA: Diagnosis not present

## 2023-10-13 DIAGNOSIS — E663 Overweight: Secondary | ICD-10-CM | POA: Diagnosis not present

## 2023-10-13 DIAGNOSIS — N200 Calculus of kidney: Secondary | ICD-10-CM | POA: Diagnosis not present

## 2023-10-13 DIAGNOSIS — R0602 Shortness of breath: Secondary | ICD-10-CM | POA: Diagnosis not present

## 2023-10-13 DIAGNOSIS — R079 Chest pain, unspecified: Secondary | ICD-10-CM | POA: Diagnosis not present

## 2023-10-13 DIAGNOSIS — K5730 Diverticulosis of large intestine without perforation or abscess without bleeding: Secondary | ICD-10-CM | POA: Diagnosis not present

## 2023-10-13 DIAGNOSIS — G4733 Obstructive sleep apnea (adult) (pediatric): Secondary | ICD-10-CM | POA: Diagnosis not present

## 2023-10-13 DIAGNOSIS — Z8679 Personal history of other diseases of the circulatory system: Secondary | ICD-10-CM | POA: Diagnosis not present

## 2023-10-13 DIAGNOSIS — R220 Localized swelling, mass and lump, head: Secondary | ICD-10-CM | POA: Diagnosis not present

## 2023-10-13 DIAGNOSIS — J988 Other specified respiratory disorders: Secondary | ICD-10-CM | POA: Diagnosis not present

## 2023-10-13 DIAGNOSIS — Z95818 Presence of other cardiac implants and grafts: Secondary | ICD-10-CM | POA: Diagnosis not present

## 2023-10-13 DIAGNOSIS — M542 Cervicalgia: Secondary | ICD-10-CM | POA: Diagnosis not present

## 2023-10-13 DIAGNOSIS — R1030 Lower abdominal pain, unspecified: Secondary | ICD-10-CM | POA: Diagnosis not present

## 2023-10-13 DIAGNOSIS — Z7982 Long term (current) use of aspirin: Secondary | ICD-10-CM | POA: Diagnosis not present

## 2023-10-13 DIAGNOSIS — T450X5A Adverse effect of antiallergic and antiemetic drugs, initial encounter: Secondary | ICD-10-CM | POA: Diagnosis not present

## 2023-10-13 DIAGNOSIS — Z7409 Other reduced mobility: Secondary | ICD-10-CM | POA: Diagnosis not present

## 2023-10-13 DIAGNOSIS — Z8673 Personal history of transient ischemic attack (TIA), and cerebral infarction without residual deficits: Secondary | ICD-10-CM | POA: Diagnosis not present

## 2023-10-13 DIAGNOSIS — Z6827 Body mass index (BMI) 27.0-27.9, adult: Secondary | ICD-10-CM | POA: Diagnosis not present

## 2023-10-13 DIAGNOSIS — I10 Essential (primary) hypertension: Secondary | ICD-10-CM | POA: Diagnosis not present

## 2023-10-13 DIAGNOSIS — E860 Dehydration: Secondary | ICD-10-CM | POA: Diagnosis not present

## 2023-10-13 DIAGNOSIS — Z7901 Long term (current) use of anticoagulants: Secondary | ICD-10-CM | POA: Diagnosis not present

## 2023-10-13 DIAGNOSIS — I4891 Unspecified atrial fibrillation: Secondary | ICD-10-CM | POA: Diagnosis not present

## 2023-10-13 DIAGNOSIS — R21 Rash and other nonspecific skin eruption: Secondary | ICD-10-CM | POA: Diagnosis not present

## 2023-10-13 DIAGNOSIS — I4819 Other persistent atrial fibrillation: Secondary | ICD-10-CM | POA: Diagnosis not present

## 2023-10-13 DIAGNOSIS — T783XXA Angioneurotic edema, initial encounter: Secondary | ICD-10-CM | POA: Diagnosis not present

## 2023-10-13 DIAGNOSIS — Z888 Allergy status to other drugs, medicaments and biological substances status: Secondary | ICD-10-CM | POA: Diagnosis not present

## 2023-10-13 DIAGNOSIS — K219 Gastro-esophageal reflux disease without esophagitis: Secondary | ICD-10-CM | POA: Diagnosis not present

## 2023-10-13 DIAGNOSIS — I071 Rheumatic tricuspid insufficiency: Secondary | ICD-10-CM | POA: Diagnosis not present

## 2023-10-13 DIAGNOSIS — S065XAA Traumatic subdural hemorrhage with loss of consciousness status unknown, initial encounter: Secondary | ICD-10-CM | POA: Diagnosis not present

## 2023-10-13 DIAGNOSIS — N281 Cyst of kidney, acquired: Secondary | ICD-10-CM | POA: Diagnosis not present

## 2023-10-13 DIAGNOSIS — J449 Chronic obstructive pulmonary disease, unspecified: Secondary | ICD-10-CM | POA: Diagnosis not present

## 2023-10-14 DIAGNOSIS — T783XXA Angioneurotic edema, initial encounter: Secondary | ICD-10-CM | POA: Diagnosis not present

## 2023-10-14 DIAGNOSIS — I4891 Unspecified atrial fibrillation: Secondary | ICD-10-CM | POA: Diagnosis not present

## 2023-10-14 DIAGNOSIS — K5730 Diverticulosis of large intestine without perforation or abscess without bleeding: Secondary | ICD-10-CM | POA: Diagnosis not present

## 2023-10-14 DIAGNOSIS — N200 Calculus of kidney: Secondary | ICD-10-CM | POA: Diagnosis not present

## 2023-10-14 DIAGNOSIS — N281 Cyst of kidney, acquired: Secondary | ICD-10-CM | POA: Diagnosis not present

## 2023-10-14 DIAGNOSIS — Z9049 Acquired absence of other specified parts of digestive tract: Secondary | ICD-10-CM | POA: Diagnosis not present

## 2023-10-14 DIAGNOSIS — J988 Other specified respiratory disorders: Secondary | ICD-10-CM | POA: Diagnosis not present

## 2023-10-15 DIAGNOSIS — S065XAA Traumatic subdural hemorrhage with loss of consciousness status unknown, initial encounter: Secondary | ICD-10-CM | POA: Diagnosis not present

## 2023-10-15 DIAGNOSIS — J988 Other specified respiratory disorders: Secondary | ICD-10-CM | POA: Diagnosis not present

## 2023-10-15 DIAGNOSIS — I10 Essential (primary) hypertension: Secondary | ICD-10-CM | POA: Diagnosis not present

## 2023-10-15 DIAGNOSIS — T783XXA Angioneurotic edema, initial encounter: Secondary | ICD-10-CM | POA: Diagnosis not present

## 2023-10-15 DIAGNOSIS — I4891 Unspecified atrial fibrillation: Secondary | ICD-10-CM | POA: Diagnosis not present

## 2023-10-15 DIAGNOSIS — I4819 Other persistent atrial fibrillation: Secondary | ICD-10-CM | POA: Diagnosis not present

## 2023-10-16 DIAGNOSIS — I4891 Unspecified atrial fibrillation: Secondary | ICD-10-CM | POA: Diagnosis not present

## 2023-10-16 DIAGNOSIS — I10 Essential (primary) hypertension: Secondary | ICD-10-CM | POA: Diagnosis not present

## 2023-10-16 DIAGNOSIS — J988 Other specified respiratory disorders: Secondary | ICD-10-CM | POA: Diagnosis not present

## 2023-10-16 DIAGNOSIS — T783XXA Angioneurotic edema, initial encounter: Secondary | ICD-10-CM | POA: Diagnosis not present

## 2023-10-16 DIAGNOSIS — I4819 Other persistent atrial fibrillation: Secondary | ICD-10-CM | POA: Diagnosis not present

## 2023-10-16 DIAGNOSIS — S065XAA Traumatic subdural hemorrhage with loss of consciousness status unknown, initial encounter: Secondary | ICD-10-CM | POA: Diagnosis not present

## 2023-10-17 DIAGNOSIS — I4819 Other persistent atrial fibrillation: Secondary | ICD-10-CM | POA: Diagnosis not present

## 2023-10-17 DIAGNOSIS — J988 Other specified respiratory disorders: Secondary | ICD-10-CM | POA: Diagnosis not present

## 2023-10-17 DIAGNOSIS — I10 Essential (primary) hypertension: Secondary | ICD-10-CM | POA: Diagnosis not present

## 2023-10-17 DIAGNOSIS — I071 Rheumatic tricuspid insufficiency: Secondary | ICD-10-CM | POA: Diagnosis not present

## 2023-10-17 DIAGNOSIS — Z95818 Presence of other cardiac implants and grafts: Secondary | ICD-10-CM | POA: Diagnosis not present

## 2023-10-17 DIAGNOSIS — I4891 Unspecified atrial fibrillation: Secondary | ICD-10-CM | POA: Diagnosis not present

## 2023-10-17 DIAGNOSIS — T783XXA Angioneurotic edema, initial encounter: Secondary | ICD-10-CM | POA: Diagnosis not present

## 2023-10-18 DIAGNOSIS — I4819 Other persistent atrial fibrillation: Secondary | ICD-10-CM | POA: Diagnosis not present

## 2023-10-19 DIAGNOSIS — I4819 Other persistent atrial fibrillation: Secondary | ICD-10-CM | POA: Diagnosis not present

## 2023-10-20 ENCOUNTER — Ambulatory Visit (HOSPITAL_COMMUNITY)

## 2023-10-20 ENCOUNTER — Ambulatory Visit (INDEPENDENT_AMBULATORY_CARE_PROVIDER_SITE_OTHER)

## 2023-10-20 ENCOUNTER — Ambulatory Visit: Payer: Self-pay | Admitting: Cardiology

## 2023-10-20 DIAGNOSIS — I639 Cerebral infarction, unspecified: Secondary | ICD-10-CM | POA: Diagnosis not present

## 2023-10-20 LAB — CUP PACEART REMOTE DEVICE CHECK
Date Time Interrogation Session: 20250618230808
Implantable Pulse Generator Implant Date: 20211215

## 2023-10-24 DIAGNOSIS — Z13220 Encounter for screening for lipoid disorders: Secondary | ICD-10-CM | POA: Diagnosis not present

## 2023-10-24 DIAGNOSIS — I129 Hypertensive chronic kidney disease with stage 1 through stage 4 chronic kidney disease, or unspecified chronic kidney disease: Secondary | ICD-10-CM | POA: Diagnosis not present

## 2023-10-24 DIAGNOSIS — D519 Vitamin B12 deficiency anemia, unspecified: Secondary | ICD-10-CM | POA: Diagnosis not present

## 2023-10-24 DIAGNOSIS — I10 Essential (primary) hypertension: Secondary | ICD-10-CM | POA: Diagnosis not present

## 2023-10-24 DIAGNOSIS — M47816 Spondylosis without myelopathy or radiculopathy, lumbar region: Secondary | ICD-10-CM | POA: Diagnosis not present

## 2023-10-24 DIAGNOSIS — E559 Vitamin D deficiency, unspecified: Secondary | ICD-10-CM | POA: Diagnosis not present

## 2023-10-24 DIAGNOSIS — Z87828 Personal history of other (healed) physical injury and trauma: Secondary | ICD-10-CM | POA: Diagnosis not present

## 2023-10-26 ENCOUNTER — Telehealth: Payer: Self-pay

## 2023-10-26 NOTE — Telephone Encounter (Signed)
 The patient missed her 10/20/2023 post-Watchman CT due to hospitalization at [facility] (afib with RVR, angioedema and UTI). She was discharged 10/19/2023.  Called to check on her and to potentially reschedule CT.  Left message to call back.

## 2023-10-27 DIAGNOSIS — K080 Exfoliation of teeth due to systemic causes: Secondary | ICD-10-CM | POA: Diagnosis not present

## 2023-11-02 NOTE — Progress Notes (Signed)
 [facility] Summary Report / Loop Recorder

## 2023-11-17 NOTE — Telephone Encounter (Signed)
 Left message to call back.  2 attempts to reach the patient were made last week as well.

## 2023-11-21 ENCOUNTER — Ambulatory Visit

## 2023-11-21 DIAGNOSIS — I639 Cerebral infarction, unspecified: Secondary | ICD-10-CM | POA: Diagnosis not present

## 2023-11-22 LAB — CUP PACEART REMOTE DEVICE CHECK
Date Time Interrogation Session: 20250720230810
Implantable Pulse Generator Implant Date: 20211215

## 2023-11-22 NOTE — Telephone Encounter (Signed)
 Letter sent to patient to contact the Watchman office to reschedule CT.

## 2023-11-30 ENCOUNTER — Ambulatory Visit: Payer: Self-pay | Admitting: Cardiology

## 2023-11-30 DIAGNOSIS — Z79899 Other long term (current) drug therapy: Secondary | ICD-10-CM | POA: Diagnosis not present

## 2023-11-30 DIAGNOSIS — R443 Hallucinations, unspecified: Secondary | ICD-10-CM | POA: Diagnosis not present

## 2023-11-30 DIAGNOSIS — R413 Other amnesia: Secondary | ICD-10-CM | POA: Diagnosis not present

## 2023-11-30 DIAGNOSIS — N390 Urinary tract infection, site not specified: Secondary | ICD-10-CM | POA: Diagnosis not present

## 2023-12-20 NOTE — Progress Notes (Signed)
 [facility] Summary Report / Loop Recorder

## 2023-12-22 ENCOUNTER — Ambulatory Visit (INDEPENDENT_AMBULATORY_CARE_PROVIDER_SITE_OTHER)

## 2023-12-22 ENCOUNTER — Ambulatory Visit: Payer: Self-pay | Admitting: Cardiology

## 2023-12-22 DIAGNOSIS — I639 Cerebral infarction, unspecified: Secondary | ICD-10-CM | POA: Diagnosis not present

## 2023-12-22 LAB — CUP PACEART REMOTE DEVICE CHECK
Date Time Interrogation Session: 20250820230736
Implantable Pulse Generator Implant Date: 20211215

## 2023-12-28 DIAGNOSIS — K080 Exfoliation of teeth due to systemic causes: Secondary | ICD-10-CM | POA: Diagnosis not present

## 2024-01-23 ENCOUNTER — Telehealth: Payer: Self-pay

## 2024-01-23 ENCOUNTER — Ambulatory Visit: Payer: Self-pay | Admitting: Cardiology

## 2024-01-23 ENCOUNTER — Ambulatory Visit (INDEPENDENT_AMBULATORY_CARE_PROVIDER_SITE_OTHER)

## 2024-01-23 DIAGNOSIS — I639 Cerebral infarction, unspecified: Secondary | ICD-10-CM | POA: Diagnosis not present

## 2024-01-23 LAB — CUP PACEART REMOTE DEVICE CHECK
Date Time Interrogation Session: 20250921230757
Implantable Pulse Generator Implant Date: 20211215

## 2024-01-23 NOTE — Telephone Encounter (Signed)
 Several attempts have been made to contact the patient to reschedule post-LAAO visit. She is scheduled for 6 month visit 11/3. Will arrange CT after that appointment and will cancel if she no-shows.

## 2024-01-24 NOTE — Progress Notes (Signed)
 Remote Loop Recorder Transmission

## 2024-02-03 NOTE — Progress Notes (Signed)
 Remote Loop Recorder Transmission

## 2024-02-21 ENCOUNTER — Encounter

## 2024-02-23 ENCOUNTER — Ambulatory Visit

## 2024-02-23 ENCOUNTER — Encounter

## 2024-02-23 DIAGNOSIS — I639 Cerebral infarction, unspecified: Secondary | ICD-10-CM | POA: Diagnosis not present

## 2024-02-23 LAB — CUP PACEART REMOTE DEVICE CHECK
Date Time Interrogation Session: 20251022230717
Implantable Pulse Generator Implant Date: 20211215

## 2024-02-24 ENCOUNTER — Ambulatory Visit: Payer: Self-pay | Admitting: Cardiology

## 2024-02-24 NOTE — Progress Notes (Signed)
 Remote Loop Recorder Transmission

## 2024-03-01 ENCOUNTER — Encounter: Admitting: Physician Assistant

## 2024-03-04 NOTE — Progress Notes (Deleted)
 Cardiology Office Note:  .   Date:  03/04/2024  ID:  Vicki Swanson, DOB Mar 23, 1946, MRN [id number] PCP: Signa Randall HERO, DO  [facility] Providers Cardiologist:  None Electrophysiologist:  Fonda Kitty, MD {  History of Present Illness: .   Vicki Swanson is a 78 y.o. female w/PMHx of  HTN, COPD, GERD, OSA (intolerant/noncompliant w/CPAP) Stroke > ILR > AFib  Aug 2024 mechanical fall resulting in a SDH Husband reported several falls Eliquis  held In out pt f/u with neurology > recommended she remain off OAC  Saw the AFib clnic 01/18/23 > persistent AFib, off OAC, Toprol  increased for rate control, introduced idea of watchman and referred to Dr. kitty  Today's visit is scheduled as her pre-watchman visit  She saw Dr. Kitty 02/18/23 Had not fallen again thankfully HAS-BLED score 5  CHA2DS2-VASc Score = 6  He spoke with the patient's neurologist, Dr. Damien Bull, and she states that the patient would be eligible for short-term anti-coagulation, minimizing duration as much as possible to limit risk. We will get a CT scan for Watchman and if anatomy is feasible for closure then start Eliquis  for 3-4 weeks leading up to implant.    Saw the AFib clinic  a couple times, last 05/06/23, rates better  I saw her 08/09/23 > pre-procedure Rates were poorly controlled Her amlodipine  stopped > dilt  S/p LAAO on 08/18/23 Stayed overnight w/RVR She had some transient hypotension overnight, limited echo was stable w/o new or significant effusion OAC resumed > BP back to her baseline and rates better Discharged 08/19/23  I saw her 09/30/23 She comes today accompanied by her husband She mentions ongoing CP, back pain, this is chronic and unchanged Generally a constant ache, might wax/wane, noted more when in bed/at rest, not when otherwise distracted Her husband reports this goes back some time No change in behavior  No groin/site concerns No palpitations No SOB No near syncope or  syncope  CT scan is scheduled for 10/20/23 stop Eliquis  last day 10/02/23 > Plavix  and ASA on 10/03/23 dental prophylaxis discussed for 6 mo, Rx provided amoxicillin  (AMOXIL ) 500 MG tablet Sig: Take 4 tablets (2,000 mg total) by mouth as needed. Take 1 hour prior to Dental work.    Unfortunately missed her CT scan (hospitalized at [facility] w/RVR, UTI, and angioedema. Treated with steroids UNCLEAR what they thought the cause of angioedema was Discharged on ASA, plavix  Started on amiodarone   Saw her PMD team July > also reported hallucinations (at night), rash, planned f/u UA and neurology referral Following with them Last was 01/20/24, ongoing reports vis her husband of AMS, constipation, concerns of another UTI UA/Cx done, she had not yet seen neurology > planned to try and expedite to neuropsychology NKDA listed + ecoli UTI >> rx antibiotic   Today is her 7mo post watchman visit ROS:    *** do not stop plavix /DAPT until CT is done *** didn't get her CT done >> rescheduled for 03/15/24 *** needs BMEt/instruction letter *** stop dental proph *** rates *** symptoms  Device information 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 kitty Amiodarone started June 2025 (via [facility] admission)   Studies Reviewed: SABRA    EKG not done today  Device transmission dated *** is reviewed by myself *** AFib CVR *** 100% AFib, rates are improved  08/19/23: limited echo 1. LImited exam.   2. Left ventricular ejection fraction,  by estimation, is 55 to 60%. The  left ventricle has normal function.   3. Right ventricular systolic function is low normal. The right  ventricular size is normal. There is normal pulmonary artery systolic  pressure.   4. The mitral valve is normal in structure. Mild mitral valve  regurgitation.   5. The inferior vena cava is normal in size with greater than 50%  respiratory variability,  suggesting right atrial pressure of 3 mmHg.   Pericardium: There is no evidence of pericardial effusion.   05/31/23: Cardiac CT IMPRESSION: 1. The left atrial appendage is a large chicken wing morphology. 2. A 27mm Watchman FLX device is recommended based on the above landing zone measurements (22.5 mm maximum diameter; 23% compression). 3. There is no thrombus in the left atrial appendage on delayed imaging. 4. An inferior posterior IAS puncture site is recommended. 5. Optimal deployment angle: RAO 46 CAU 13 6. Normal coronary origin. Right dominance. Coronary artery calcium  score is 196, which is 66th percentile for age and sex matched peers.   03/18/23: TTE  1. Left ventricular ejection fraction, by estimation, is 50%. The left  ventricle has mildly decreased function. The left ventricle demonstrates  global hypokinesis. Left ventricular diastolic parameters are  indeterminate.   2. Right ventricular systolic function is mildly reduced. The right  ventricular size is normal. There is normal pulmonary artery systolic  pressure. The estimated right ventricular systolic pressure is 33.9 mmHg.   3. Left atrial size was mildly dilated.   4. Right atrial size was mildly dilated.   5. The mitral valve is normal in structure. No evidence of mitral valve  regurgitation. No evidence of mitral stenosis.   6. Tricuspid valve regurgitation is mild to moderate.   7. The aortic valve is tricuspid. There is mild calcification of the  aortic valve. Aortic valve regurgitation is not visualized. No aortic  stenosis is present.   8. The inferior vena cava is normal in size with greater than 50%  respiratory variability, suggesting right atrial pressure of 3 mmHg.   9. The patient was in atrial fibrillation.    Nuclear Stress 01/07/21:  LVEF 69%.  Normal study.  No evidence of ischemia or infarction.   Echo 04/14/20:  LVEF 65 to 70%.  Grade 2 diastolic dysfunction. Normal RV size and  function. Mildly dilated left atrium. Mildly dilated right atrium. Aortic valve sclerosis without stenosis.   Risk Assessment/Calculations:    Physical Exam:   VS:  There were no vitals taken for this visit.   Wt Readings from Last 3 Encounters:  09/30/23 137 lb (62.1 kg)  08/18/23 140 lb (63.5 kg)  08/09/23 139 lb 12.8 oz (63.4 kg)    GEN: Well nourished, well developed in no acute distress NECK: No JVD; No carotid bruits CARDIAC: irreg-irreg, tachycardic, no murmurs, rubs, gallops RESPIRATORY:  CTA b/l without rales, wheezing or rhonchi  ABDOMEN: Soft, non-tender, non-distended EXTREMITIES: No edema; No deformity   ASSESSMENT AND PLAN: .    Longstanding persistent AFib ILR CHA2DS2Vasc is 6 Now s/p LAAO on 08/18/23 *** Rate controlled ***   HTN *** Looks good  Secondary hypercoagulable state 2/2 AFib  4. CP *** Sounds atypical, chronic and mostly noted at rest Neg stress test 2022 05/31/23 CT: Normal coronary origin. Right dominance. Coronary artery calcium  score is 196, which is 66th percentile for age and sex matched Peers     Dispo: back in [place] , sooner if needed  Signed, Charlies Macario Arthur,  PA-C

## 2024-03-05 ENCOUNTER — Ambulatory Visit: Attending: Cardiology | Admitting: Physician Assistant

## 2024-03-05 ENCOUNTER — Telehealth: Payer: Self-pay

## 2024-03-05 NOTE — Telephone Encounter (Signed)
 Left voicemail for patient to return call to reschedule no show appt today.   Called spouse. He apologized for missing the appointment today. Rescheduled appointment to 11/7 with Daphne Barrack. Confirmed CT appointment 11/13.  He confirmed dates. Advised they will receive CT instructions at appt on Friday.

## 2024-03-07 ENCOUNTER — Encounter: Payer: Self-pay | Admitting: Physician Assistant

## 2024-03-08 NOTE — Progress Notes (Unsigned)
 Electrophysiology Office Note:   Date:  03/09/2024  ID:  Vicki Swanson, DOB [birth year], MRN [id number]  Primary Cardiologist: None Primary Heart Failure: None Electrophysiologist: Fonda Kitty, MD      History of Present Illness:   Vicki Swanson is a 78 y.o. female with h/o AF s/p LAAO, CVA (ILR > AF), HTN, COPD, OSA, GERD, mechanical fall with SDH seen today for routine electrophysiology followup.   Since last being seen in our clinic the patient reports she is always tired and feels weak.  She is unaware of her AF - no fast rates or palpitations. She reports she was in the hospital in [place] and had a UTI. She states she had a terrible experience and got so sick she was hallucinating. She continues to wake up and see cats in the middle of the night. She states her breasts hurt all the time and are sensitive to touch. She states this was happening before the Watchman. She has not talked to her PCP about any of the concerns.   She denies chest pain, palpitations, dyspnea, PND, orthopnea, nausea, vomiting, dizziness, syncope, edema, weight gain, or early satiety.   Review of systems complete and found to be negative unless listed in HPI.   EP Information / Studies Reviewed:    EKG is ordered today. Personal review as below.  EKG Interpretation Date/Time:  Friday March 09 2024 15:09:41 EST Ventricular Rate:  95 PR Interval:    QRS Duration:  64 QT Interval:  360 QTC Calculation: 452 R Axis:   66  Text Interpretation: Atrial fibrillation Confirmed by Aniceto Jarvis ([id number]) on 03/09/2024 3:28:09 PM    Arrhythmia / AAD / Pertinent EP Studies AF > initial dx 04/2020 LAAO 08/18/23 > 24 mm Watchman FLX Pro   Device  MDT ILR 04/16/20 > implanted for cryptogenic CVA   Risk Assessment/Calculations:    CHA2DS2-VASc Score = 6   This indicates a 9.7% annual risk of stroke. The patient's score is based upon: CHF History: 0 HTN History: 1 Diabetes History: 0 Stroke History:  2 Vascular Disease History: 0 Age Score: 2 Gender Score: 1         Physical Exam:   VS:  BP (!) 134/90   Pulse 95   Ht 5' 2 (1.575 m)   SpO2 97%   BMI 25.06 kg/m    Wt Readings from Last 3 Encounters:  09/30/23 137 lb (62.1 kg)  08/18/23 140 lb (63.5 kg)  08/09/23 139 lb 12.8 oz (63.4 kg)     GEN: Well nourished, well developed in no acute distress NECK: No JVD; No carotid bruits CARDIAC: Irregularly irregular rate and rhythm, no murmurs, rubs, gallops RESPIRATORY:  Clear to auscultation without rales, wheezing or rhonchi  ABDOMEN: Soft, non-tender, non-distended EXTREMITIES:  No edema; No deformity   ASSESSMENT AND PLAN:    Longstanding Persistent Atrial Fibrillation  CHA2DS2-VASc 6, s/p LAAO 08/18/23  -EKG with AF -pt's husband confirms she did start DAPT on 10/03/23  -6 months post was 02/17/24 > will continue Plavix  and ASA until CT scan completed, once reviewed and well sealed, discussed she could transition to ASA alone  -stop SBE / no longer needed    -CT imaging / missed scheduled assessment > planned for 03/15/24  -pre-CT labs > BMP  -ILR review shows 100% AF burden from 5/30-11/7/25    Hypertension  -well controlled on current regimen    Hx TIA 2021  -AF identified on ILR post stroke  Follow up with EP APP in April 2026 for post Watchman   Signed, Daphne Barrack, NP-C, AGACNP-BC [facility] - Electrophysiology  03/09/2024, 3:34 PM

## 2024-03-09 ENCOUNTER — Ambulatory Visit: Attending: Pulmonary Disease | Admitting: Pulmonary Disease

## 2024-03-09 ENCOUNTER — Encounter: Payer: Self-pay | Admitting: Pulmonary Disease

## 2024-03-09 VITALS — BP 134/90 | HR 95 | Ht 62.0 in

## 2024-03-09 DIAGNOSIS — I10 Essential (primary) hypertension: Secondary | ICD-10-CM | POA: Diagnosis not present

## 2024-03-09 DIAGNOSIS — Z95818 Presence of other cardiac implants and grafts: Secondary | ICD-10-CM

## 2024-03-09 DIAGNOSIS — I4811 Longstanding persistent atrial fibrillation: Secondary | ICD-10-CM | POA: Diagnosis not present

## 2024-03-09 DIAGNOSIS — I639 Cerebral infarction, unspecified: Secondary | ICD-10-CM

## 2024-03-09 NOTE — Patient Instructions (Addendum)
 Medication Instructions:  Continue your Plavix  until you hear from Dr. Kennyth regarding your CT scan.  Once you hear back about the results and the device is well sealed, you can stop your plavix .  If there are concerns, they will give you specific instruction.    You should stay on Aspirin  81 mg daily even once you stop plavix   *If you need a refill on your cardiac medications before your next appointment, please call your pharmacy*  Lab Work: We will get your pre-CT blood work done today > BMP to review your kidney function and electrolytes If you have labs (blood work) drawn today and your tests are completely normal, you will receive your results only by: MyChart Message (if you have MyChart) OR A paper copy in the mail If you have any lab test that is abnormal or we need to change your treatment, we will call you to review the results.  Testing/Procedures: You are pending a CT scan on 11/13 at 1:30 pm to review the Watchman position and patency.  Follow-Up: At [facility], you and your health needs are our priority.  As part of our continuing mission to provide you with exceptional heart care, our providers are all part of one team.  This team includes your primary Cardiologist (physician) and Advanced Practice Providers or APPs (Physician Assistants and Nurse Practitioners) who all work together to provide you with the care you need, when you need it.  Your next appointment:   End of April 2026 to follow up regarding Watchman  Provider:   You may see Fonda Kennyth, MD or one of the following Advanced Practice Providers on your designated Care Team:    Daphne Barrack, NP    We recommend signing up for the patient portal called MyChart.  Sign up information is provided on this After Visit Summary.  MyChart is used to connect with patients for Virtual Visits (Telemedicine).  Patients are able to view lab/test results, encounter notes, upcoming appointments, etc.  Non-urgent  messages can be sent to your provider as well.   To learn more about what you can do with MyChart, go to [email or web address].

## 2024-03-10 LAB — BASIC METABOLIC PANEL WITH GFR
BUN/Creatinine Ratio: 9 — ABNORMAL LOW (ref 12–28)
BUN: 11 mg/dL (ref 8–27)
CO2: 20 mmol/L (ref 20–29)
Calcium: 9.2 mg/dL (ref 8.7–10.3)
Chloride: 108 mmol/L — ABNORMAL HIGH (ref 96–106)
Creatinine, Ser: 1.24 mg/dL — ABNORMAL HIGH (ref 0.57–1.00)
Glucose: 76 mg/dL (ref 70–99)
Potassium: 3.7 mmol/L (ref 3.5–5.2)
Sodium: 144 mmol/L (ref 134–144)
eGFR: 44 mL/min/1.73 — ABNORMAL LOW (ref >59)

## 2024-03-15 ENCOUNTER — Ambulatory Visit (HOSPITAL_COMMUNITY)
Admission: RE | Admit: 2024-03-15 | Discharge: 2024-03-15 | Disposition: A | Discharge status: Home / Self Care | Source: Ambulatory Visit | Attending: Cardiology | Admitting: Cardiology

## 2024-03-15 DIAGNOSIS — Z95818 Presence of other cardiac implants and grafts: Secondary | ICD-10-CM | POA: Insufficient documentation

## 2024-03-15 DIAGNOSIS — I4819 Other persistent atrial fibrillation: Secondary | ICD-10-CM | POA: Diagnosis present

## 2024-03-15 MED ORDER — IOHEXOL 350 MG/ML SOLN
100.0000 mL | Freq: Once | INTRAVENOUS | Status: AC | PRN
Start: 2024-03-15 — End: 2024-03-15
  Administered 2024-03-15: 100 mL via INTRAVENOUS

## 2024-03-16 ENCOUNTER — Ambulatory Visit: Payer: Self-pay | Admitting: Cardiology

## 2024-03-19 NOTE — Telephone Encounter (Signed)
 Spoke with patient's husband. Reviewed CT results. Explained patient can stop Plavix  and continue 81mg  ASA daily.   He verbalized understanding.  Med list updated.

## 2024-03-23 ENCOUNTER — Encounter

## 2024-03-25 ENCOUNTER — Ambulatory Visit

## 2024-03-25 DIAGNOSIS — I4811 Longstanding persistent atrial fibrillation: Secondary | ICD-10-CM

## 2024-03-26 ENCOUNTER — Encounter

## 2024-03-26 LAB — CUP PACEART REMOTE DEVICE CHECK
Date Time Interrogation Session: 20251122230609
Implantable Pulse Generator Implant Date: 20211215

## 2024-03-27 NOTE — Progress Notes (Signed)
 Remote Loop Recorder Transmission

## 2024-03-28 ENCOUNTER — Ambulatory Visit: Payer: Self-pay | Admitting: Cardiology

## 2024-04-23 ENCOUNTER — Encounter

## 2024-04-25 ENCOUNTER — Ambulatory Visit

## 2024-04-25 DIAGNOSIS — I4811 Longstanding persistent atrial fibrillation: Secondary | ICD-10-CM

## 2024-04-25 LAB — CUP PACEART REMOTE DEVICE CHECK
Date Time Interrogation Session: 20251223230314
Implantable Pulse Generator Implant Date: 20211215

## 2024-04-26 ENCOUNTER — Encounter

## 2024-04-27 ENCOUNTER — Ambulatory Visit: Payer: Self-pay | Admitting: Cardiology

## 2024-04-27 NOTE — Progress Notes (Signed)
 Remote Loop Recorder Transmission

## 2024-05-02 ENCOUNTER — Telehealth: Payer: Self-pay

## 2024-05-02 NOTE — Telephone Encounter (Signed)
 Left message requesting call back on home phone number.  Unable to leave message on cell phone d/t VM not set up.

## 2024-05-02 NOTE — Telephone Encounter (Signed)
 Alert received:  Alert remote transmission: Avg V rates during AT/AF>threshold 14 AF events with known persistent AF.   V-rates recently increased slightly via trending.  S/P Watchman.    Will assess for symptoms/medication compliance

## 2024-05-04 NOTE — Telephone Encounter (Addendum)
 Spoke with patient.  She has had a lot going on and has not been feeling well over all in recent weeks.   Symptoms: BP today 135/100 Frequent falls, with head injuries at times Extreme weakness Recent severe UTI, reports mild hallucinations and having bad dreams also recently had what they think was the flu. Can't hardly walk Was hard to understand, not speaking very clearly on phone, sounded very weak.   Discussed concerns that patient could be becoming septic from UTI.  Encouraged her to go to ER today and be seen.  Says she was supposed to have an appt at [facility] today but was too weak and cancelled, sees PCP next Tuesday. Instructed with symptoms that I do not feel she should wait that long and should go today to be seen.   She says she is taking her medications, including diltiazem  and metoprolol  without any missed doses.   Until we know what underlying may be going on - do not feel that dosage adjustments on those meds are appropriate at this time but will defer to Dr. Inocencio.  She should go to the ER or in least contact her PCP who is more aware of recent acute illness progression.   Patient states she is not going anywhere today, it will have to be tomorrow.   Husband who was very clear and assisted with checking her vitals today, verbalized understanding of my concern and my directive for ER care.  At least on the phone he did not seem to want to speak over her, but was aware of my strong recommendation.   Will forward to Dr. Inocencio if he recommends anything further.

## 2024-05-04 NOTE — Telephone Encounter (Signed)
 Spoke with Charlies Arthur PA-C.   She agrees with ER and would like to re-iterate calling EMS to take her if she feels too weak to transfer.   Called patient/husband back and shared above statement.  Had to leave on VM as they did not answer the phone, clearly stated our reiteration that patient should call EMS if too weak and go to the ER today.  Requested they call back to discuss if not going directly to ER.

## 2024-05-08 NOTE — Telephone Encounter (Signed)
Attempted phone call to pt and left voicemail message to contact office at [phone]. 

## 2024-05-11 NOTE — Telephone Encounter (Signed)
 Attempted to call patient. Unable to leave voicemail.

## 2024-05-24 ENCOUNTER — Encounter

## 2024-05-26 ENCOUNTER — Ambulatory Visit: Attending: Cardiology

## 2024-05-26 DIAGNOSIS — I639 Cerebral infarction, unspecified: Secondary | ICD-10-CM

## 2024-05-28 LAB — CUP PACEART REMOTE DEVICE CHECK
Date Time Interrogation Session: 20260123230547
Implantable Pulse Generator Implant Date: 20211215

## 2024-05-29 ENCOUNTER — Ambulatory Visit: Payer: Self-pay | Admitting: Cardiology

## 2024-05-31 NOTE — Progress Notes (Signed)
 Remote Loop Recorder Transmission

## 2024-06-24 ENCOUNTER — Encounter

## 2024-06-26 ENCOUNTER — Ambulatory Visit

## 2024-07-25 ENCOUNTER — Encounter

## 2024-07-27 ENCOUNTER — Ambulatory Visit

## 2024-08-25 ENCOUNTER — Encounter

## 2024-08-27 ENCOUNTER — Ambulatory Visit
